# Patient Record
Sex: Male | Born: 1955 | Race: White | Hispanic: No | Marital: Married | Smoking: Never smoker
Health system: Southern US, Community
[De-identification: ages and names within clinical notes are randomized; demographics above are authoritative.]

## PROBLEM LIST (undated history)

## (undated) DIAGNOSIS — E119 Type 2 diabetes mellitus without complications: Secondary | ICD-10-CM

## (undated) DIAGNOSIS — I4891 Unspecified atrial fibrillation: Secondary | ICD-10-CM

## (undated) DIAGNOSIS — I10 Essential (primary) hypertension: Secondary | ICD-10-CM

## (undated) DIAGNOSIS — I7300 Raynaud's syndrome without gangrene: Secondary | ICD-10-CM

## (undated) DIAGNOSIS — I2510 Atherosclerotic heart disease of native coronary artery without angina pectoris: Secondary | ICD-10-CM

## (undated) DIAGNOSIS — E785 Hyperlipidemia, unspecified: Secondary | ICD-10-CM

## (undated) DIAGNOSIS — E079 Disorder of thyroid, unspecified: Secondary | ICD-10-CM

## (undated) HISTORY — DX: Unspecified atrial fibrillation: I48.91

## (undated) HISTORY — PX: CARDIAC CATHETERIZATION: SHX172

## (undated) HISTORY — DX: Essential (primary) hypertension: I10

## (undated) HISTORY — DX: Raynaud's syndrome without gangrene: I73.00

## (undated) HISTORY — DX: Type 2 diabetes mellitus without complications: E11.9

## (undated) HISTORY — DX: Hyperlipidemia, unspecified: E78.5

## (undated) HISTORY — DX: Atherosclerotic heart disease of native coronary artery without angina pectoris: I25.10

## (undated) HISTORY — DX: Disorder of thyroid, unspecified: E07.9

---

## 2005-05-30 HISTORY — PX: CARDIAC SURGERY: SHX584

## 2006-01-30 ENCOUNTER — Ambulatory Visit: Payer: Self-pay | Admitting: Gastroenterology

## 2006-05-31 DIAGNOSIS — I219 Acute myocardial infarction, unspecified: Secondary | ICD-10-CM | POA: Insufficient documentation

## 2008-04-14 ENCOUNTER — Emergency Department: Payer: Self-pay | Admitting: Unknown Physician Specialty

## 2012-08-20 ENCOUNTER — Ambulatory Visit: Payer: Self-pay | Admitting: Family Medicine

## 2012-08-28 ENCOUNTER — Ambulatory Visit: Payer: Self-pay | Admitting: Family Medicine

## 2012-09-16 ENCOUNTER — Ambulatory Visit: Payer: Self-pay | Admitting: Family Medicine

## 2012-09-28 ENCOUNTER — Ambulatory Visit: Payer: Self-pay | Admitting: Family Medicine

## 2013-11-19 IMAGING — US ABDOMEN ULTRASOUND LIMITED
1 series · 14 of 25 positions shown · non-contrast
Comparison: 

REASON FOR EXAM: RUQ pain    Gallbladder
COMMENTS:

PROCEDURE:     US  - US ABDOMEN LIMITED SURVEY  - September 16, 2012  [DATE]
RESULT:     Right or quadrant abdominal ultrasound dated
TECHNIQUE: Real time sonographic imaging of the right quadrant was obtained.
Static representative images were provided for interpretation.

[Series 1: abdomen ultrasound limited · 0.23mm/px · 14 of 51 slices shown]
[im 1/51]
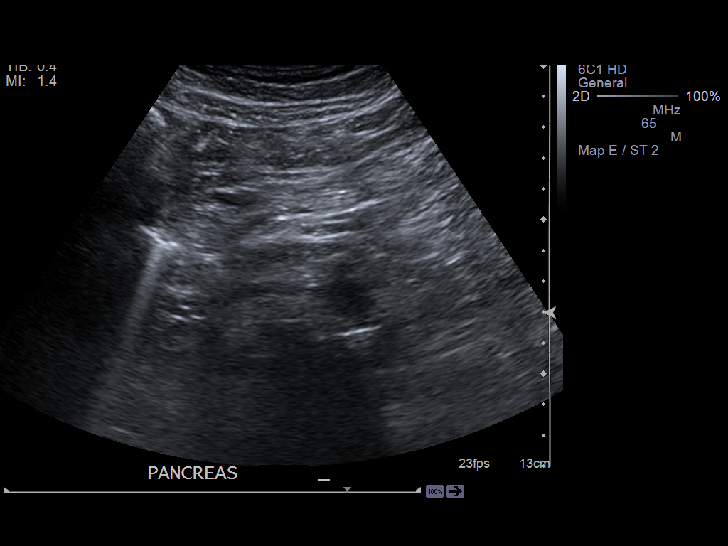
[im 5/51]
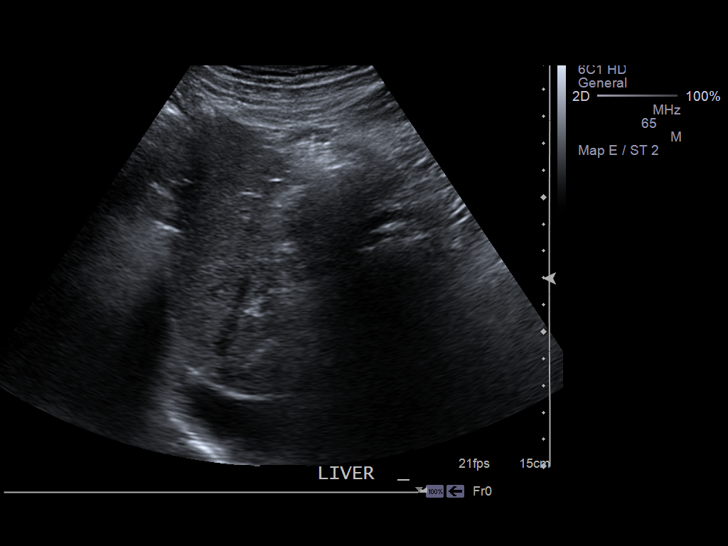
[im 9/51]
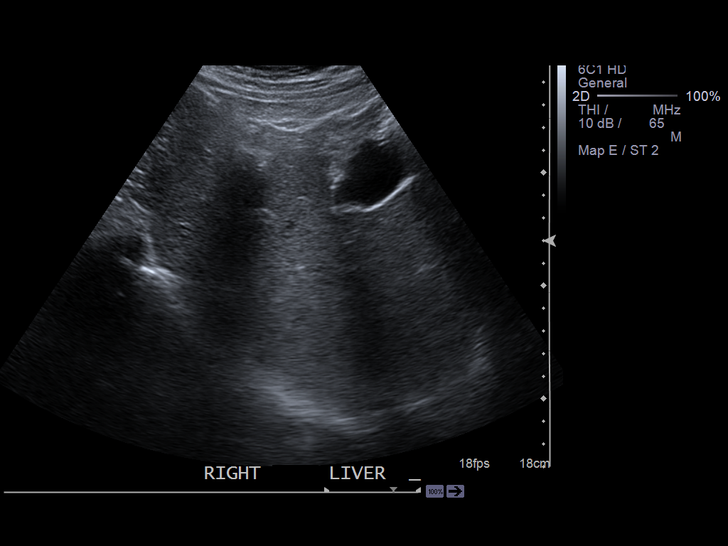
[im 13/51]
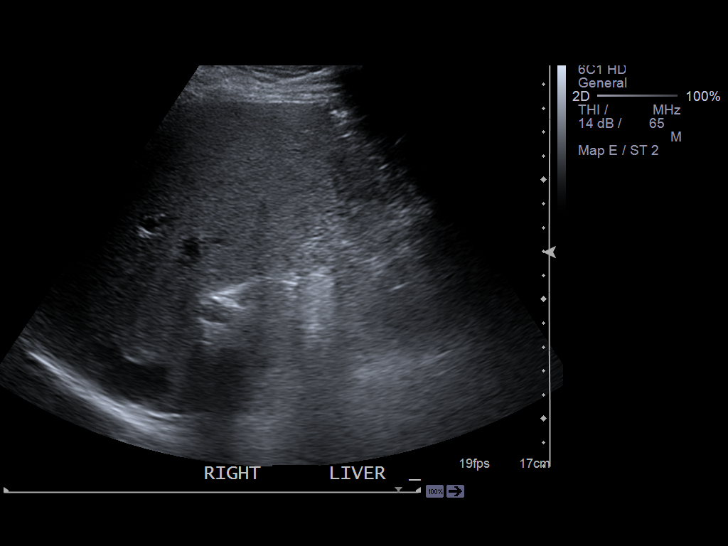
[im 17/51]
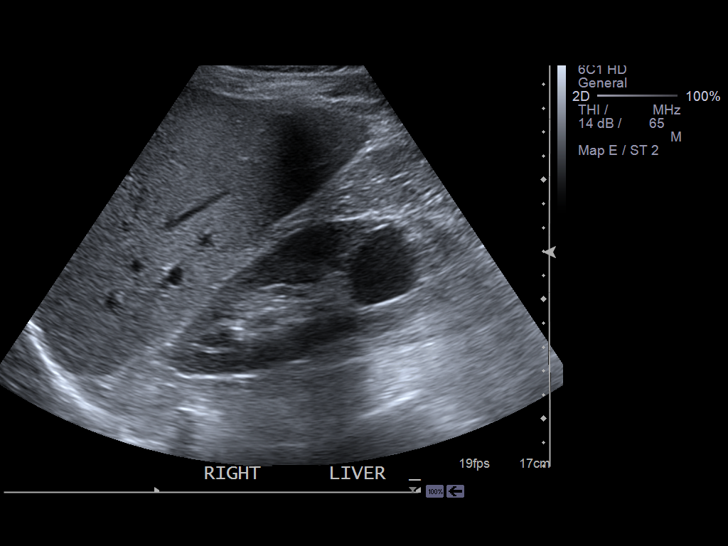
[im 19/51]
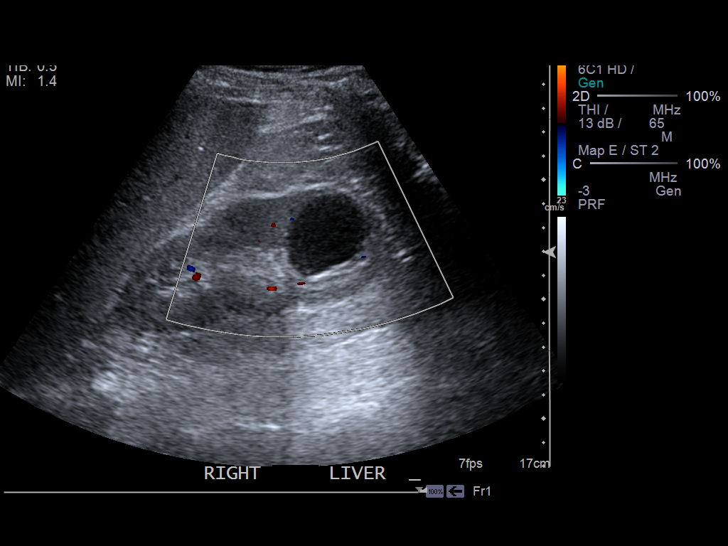
[im 23/51]
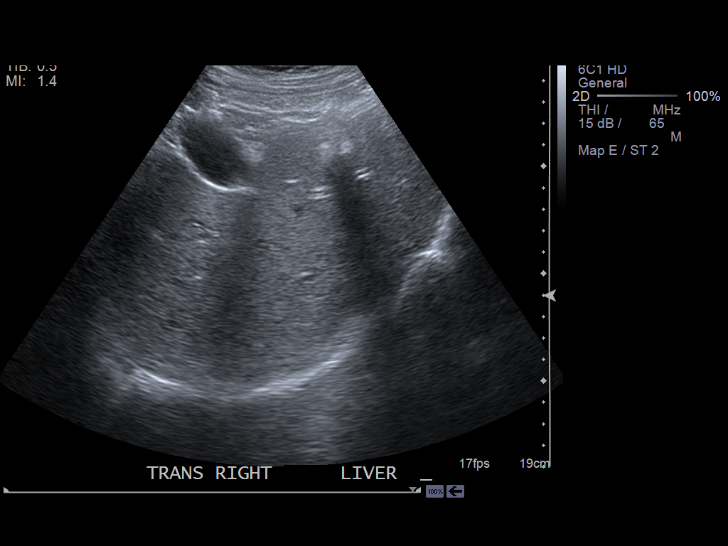
[im 28/51]
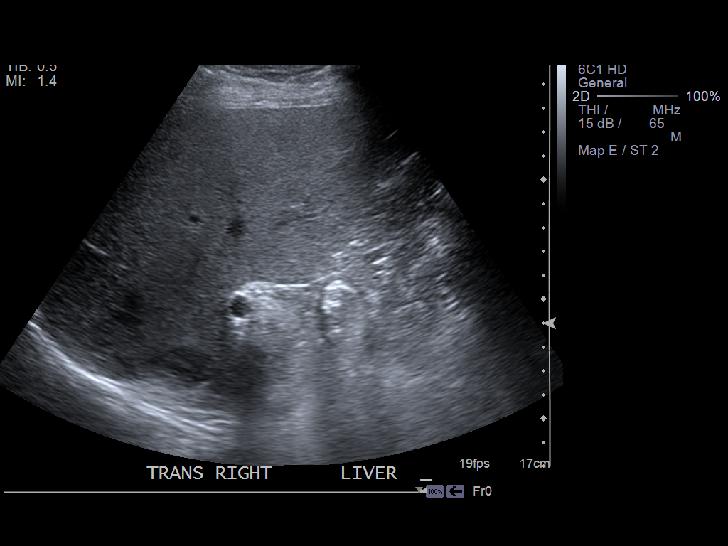
[im 32/51]
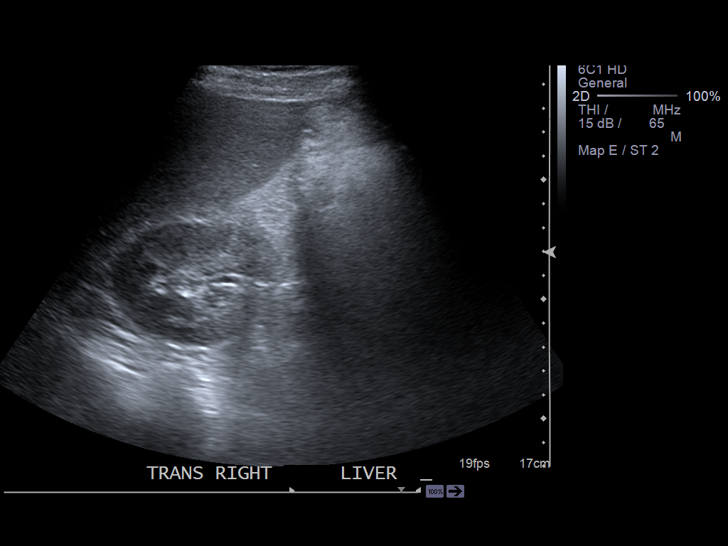
[im 34/51]
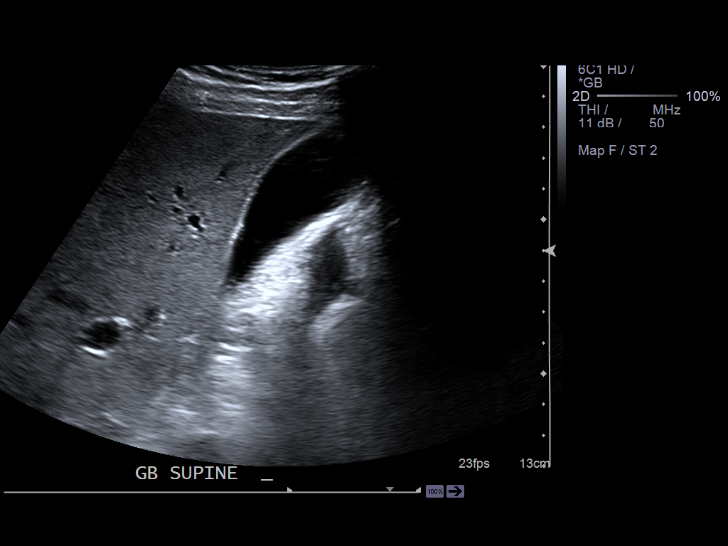
[im 38/51]
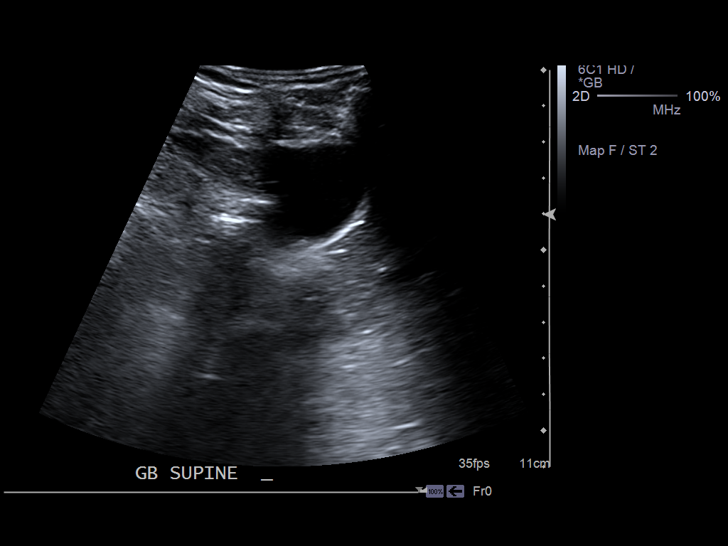
[im 42/51]
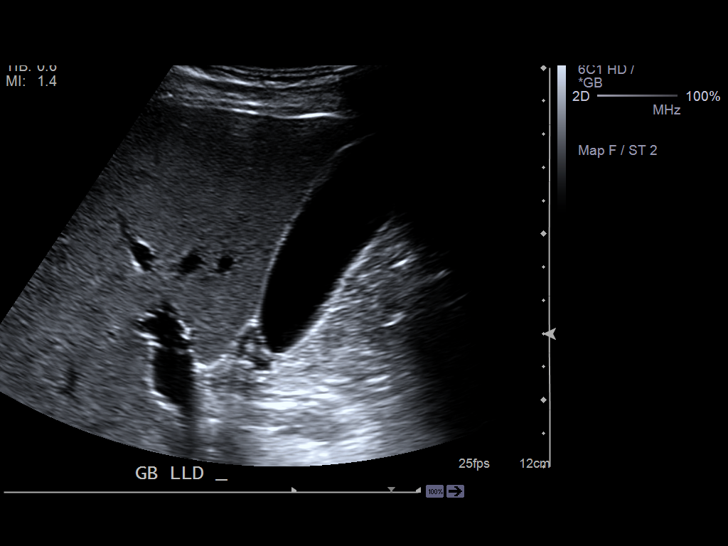
[im 46/51]
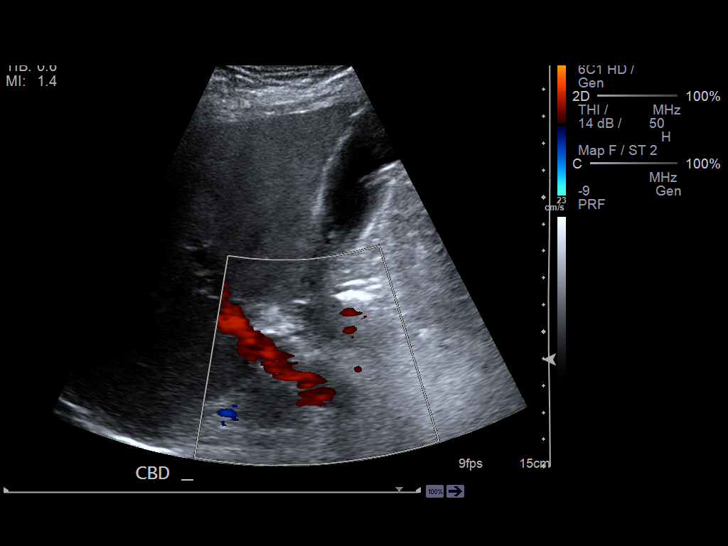
[im 51/51]
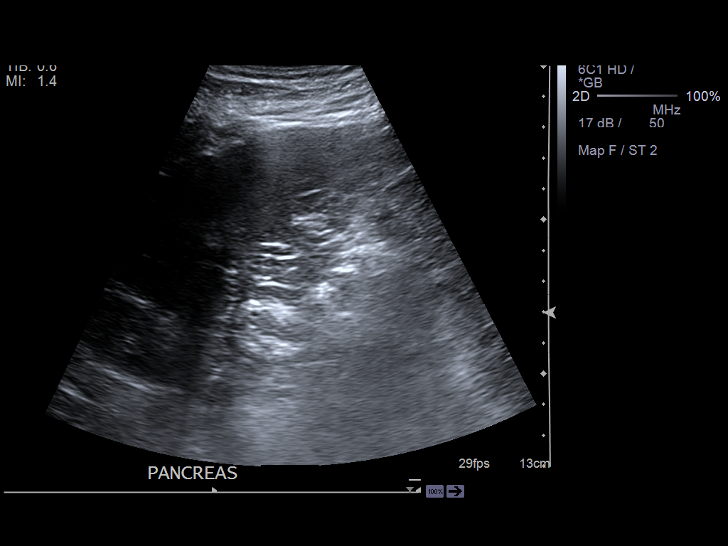

[14 of 25 positions shown; findings below may reference images not displayed]

FINDINGS: The liver is unremarkable. Hepatopedal flow is identified within
the portal vein.

There is no evidence of pericholecystic fluid, gallstones, sludging,
gallbladder wall thickening, nor common bile duct dilation. The gallbladder
wall thickness is 1.6 mm and the common bile duct measures  4.5 mm in
diameter. The technologist was unable to elicit a sonographic Murphy's sign.
The pancreas is unremarkable.

Incidental finding  made of a cyst within the lower pole the right kidney.
IMPRESSION: No sonographic evidence of cholecystitis nor cholelithiasis.

## 2013-11-29 ENCOUNTER — Ambulatory Visit: Payer: Self-pay | Admitting: Gastroenterology

## 2013-12-27 LAB — CBC AND DIFFERENTIAL
HCT: 45 % (ref 41–53)
HEMOGLOBIN: 15.4 g/dL (ref 13.5–17.5)
Neutrophils Absolute: 3 /uL
PLATELETS: 233 10*3/uL (ref 150–399)
WBC: 6.2 10^3/mL

## 2014-06-16 LAB — LIPID PANEL
Cholesterol: 106 mg/dL (ref 0–200)
HDL: 44 mg/dL (ref 35–70)
LDL CALC: 34 mg/dL
LDL/HDL RATIO: 0.8
TRIGLYCERIDES: 141 mg/dL (ref 40–160)

## 2014-06-16 LAB — HEPATIC FUNCTION PANEL
ALT: 38 U/L (ref 10–40)
AST: 31 U/L (ref 14–40)
Alkaline Phosphatase: 82 U/L (ref 25–125)
BILIRUBIN, TOTAL: 0.7 mg/dL

## 2014-06-16 LAB — BASIC METABOLIC PANEL
BUN: 16 mg/dL (ref 4–21)
CREATININE: 0.9 mg/dL (ref 0.6–1.3)
Glucose: 115 mg/dL
Potassium: 5.4 mmol/L — AB (ref 3.4–5.3)
SODIUM: 138 mmol/L (ref 137–147)

## 2014-06-16 LAB — HEMOGLOBIN A1C: HEMOGLOBIN A1C: 5.8 % (ref 4.0–6.0)

## 2014-06-16 LAB — TSH: TSH: 3.35 u[IU]/mL (ref <=5.90)

## 2014-11-10 ENCOUNTER — Encounter: Payer: Self-pay | Admitting: Family Medicine

## 2014-11-15 ENCOUNTER — Other Ambulatory Visit: Payer: Self-pay | Admitting: Family Medicine

## 2014-12-07 DIAGNOSIS — E119 Type 2 diabetes mellitus without complications: Secondary | ICD-10-CM | POA: Insufficient documentation

## 2014-12-07 DIAGNOSIS — E559 Vitamin D deficiency, unspecified: Secondary | ICD-10-CM | POA: Insufficient documentation

## 2014-12-07 DIAGNOSIS — E663 Overweight: Secondary | ICD-10-CM | POA: Insufficient documentation

## 2014-12-07 DIAGNOSIS — I25118 Atherosclerotic heart disease of native coronary artery with other forms of angina pectoris: Secondary | ICD-10-CM | POA: Insufficient documentation

## 2014-12-07 DIAGNOSIS — I7300 Raynaud's syndrome without gangrene: Secondary | ICD-10-CM | POA: Insufficient documentation

## 2014-12-07 DIAGNOSIS — I10 Essential (primary) hypertension: Secondary | ICD-10-CM | POA: Insufficient documentation

## 2014-12-07 DIAGNOSIS — E039 Hypothyroidism, unspecified: Secondary | ICD-10-CM | POA: Insufficient documentation

## 2014-12-07 DIAGNOSIS — E785 Hyperlipidemia, unspecified: Secondary | ICD-10-CM | POA: Insufficient documentation

## 2014-12-07 DIAGNOSIS — F320 Major depressive disorder, single episode, mild: Secondary | ICD-10-CM | POA: Insufficient documentation

## 2014-12-07 DIAGNOSIS — I2510 Atherosclerotic heart disease of native coronary artery without angina pectoris: Secondary | ICD-10-CM | POA: Insufficient documentation

## 2014-12-07 DIAGNOSIS — G4700 Insomnia, unspecified: Secondary | ICD-10-CM | POA: Insufficient documentation

## 2014-12-07 DIAGNOSIS — E1165 Type 2 diabetes mellitus with hyperglycemia: Secondary | ICD-10-CM | POA: Insufficient documentation

## 2014-12-13 ENCOUNTER — Encounter: Payer: Self-pay | Admitting: Family Medicine

## 2014-12-13 ENCOUNTER — Ambulatory Visit (INDEPENDENT_AMBULATORY_CARE_PROVIDER_SITE_OTHER): Payer: 59 | Admitting: Family Medicine

## 2014-12-13 VITALS — BP 116/72 | HR 82 | Temp 97.7°F | Resp 16 | Wt 196.0 lb

## 2014-12-13 DIAGNOSIS — E785 Hyperlipidemia, unspecified: Secondary | ICD-10-CM | POA: Diagnosis not present

## 2014-12-13 DIAGNOSIS — E039 Hypothyroidism, unspecified: Secondary | ICD-10-CM | POA: Diagnosis not present

## 2014-12-13 DIAGNOSIS — J069 Acute upper respiratory infection, unspecified: Secondary | ICD-10-CM

## 2014-12-13 DIAGNOSIS — G4700 Insomnia, unspecified: Secondary | ICD-10-CM | POA: Diagnosis not present

## 2014-12-13 DIAGNOSIS — I10 Essential (primary) hypertension: Secondary | ICD-10-CM

## 2014-12-13 DIAGNOSIS — F320 Major depressive disorder, single episode, mild: Secondary | ICD-10-CM

## 2014-12-13 DIAGNOSIS — E119 Type 2 diabetes mellitus without complications: Secondary | ICD-10-CM | POA: Diagnosis not present

## 2014-12-13 LAB — POCT GLYCOSYLATED HEMOGLOBIN (HGB A1C): Hemoglobin A1C: 6.5

## 2014-12-13 MED ORDER — TEMAZEPAM 30 MG PO CAPS: 30.0000 mg | ORAL_CAPSULE | Freq: Every day | ORAL | Status: DC

## 2014-12-13 NOTE — Progress Notes (Signed)
Patient ID: [name] [name], male   DOB: [birth year], 59 y.o.   MRN: [id number]    Subjective:  HPI  Diabetes Mellitus Type II, Follow-up:   Lab Results  Component Value Date   HGBA1C 5.8 06/16/2014   Last seen for diabetes 6 months ago.  Management since then includes none. He reports good compliance with treatment. He is not having side effects.  Current symptoms include none. Home blood sugar records: 120-140 fasting   Episodes of hypoglycemia? no   Current Insulin Regimen: n/a Most Recent Eye Exam: 10/2014 Current exercise: daily, walking, running sometimes and yard work  ------------------------------------------------------------------------   Hypertension, follow-up:  BP Readings from Last 3 Encounters:  12/13/14 116/72  06/16/14 122/68    He was last seen for hypertension 6 months ago.  BP at that visit was 122/68. Management since that visit includes none .He reports good compliance with treatment. He is not having side effects.  He is exercising. Outside blood pressures are 120-130's/65-75's.  Patient denies chest pain, chest pressure/discomfort, exertional chest pressure/discomfort, irregular heart beat, lower extremity edema and palpitations.   Cardiovascular risk factors include advanced age (older than 59 for men, 13 for women), diabetes mellitus, dyslipidemia, hypertension and male gender.  ------------------------------------------------------------------------    Lipid/Cholesterol, Follow-up:   Last seen for this 6 months ago.  Management since that visit includes none.  Last Lipid Panel:    Component Value Date/Time   CHOL 106 06/16/2014   TRIG 141 06/16/2014   HDL 44 06/16/2014   LDLCALC 34 06/16/2014    He reports good compliance with treatment. He is not having side effects.   Wt Readings from Last 3 Encounters:  12/13/14 196 lb (88.905 kg)  06/16/14 193 lb (87.544 kg)     ------------------------------------------------------------------------  Pt reports that he has URI. His grandson started kindergarten this year and gave him a cold. He reports that it started 4 days ago. Runny, congestion, cough, malaise, some body aches but he had been on the roof the day prior and thinks it is just sore from that. He has a fall festival this weekend and he has to cook and wants to be well for it.    Prior to Admission medications   Medication Sig Start Date End Date Taking? Authorizing Provider  aspirin 81 MG tablet Take by mouth. 12/03/11  Yes Historical Provider, MD  atorvastatin (LIPITOR) 20 MG tablet Take by mouth. 05/24/13  Yes Historical Provider, MD  cholecalciferol (VITAMIN D) 1000 UNITS tablet Take by mouth. 12/03/11  Yes Historical Provider, MD  Coenzyme Q10 100 MG TABS Take by mouth. 12/03/11  Yes Historical Provider, MD  enalapril (VASOTEC) 20 MG tablet Take 1 tablet by mouth  daily 11/15/14  Yes Richard Maceo Pro., MD  ezetimibe (ZETIA) 10 MG tablet Take by mouth. 06/16/14  Yes Historical Provider, MD  fluticasone (FLONASE) 50 MCG/ACT nasal spray Place into the nose. 07/17/12  Yes Historical Provider, MD  lansoprazole (PREVACID 24HR) 15 MG capsule Take by mouth. 06/11/12  Yes Historical Provider, MD  levothyroxine (SYNTHROID) 50 MCG tablet Take by mouth. 06/16/14  Yes Historical Provider, MD  Mesalamine (ASACOL HD) 800 MG TBEC Take by mouth.   Yes Historical Provider, MD  metFORMIN (GLUCOPHAGE) 1000 MG tablet Take by mouth. 06/16/14  Yes Historical Provider, MD  metoprolol succinate (TOPROL-XL) 100 MG 24 hr tablet Take by mouth. 06/16/14  Yes Historical Provider, MD  nitroGLYCERIN (NITROSTAT) 0.4 MG SL tablet Place under the tongue. 06/16/14  Yes Historical  Provider, MD  ondansetron (ZOFRAN ODT) 8 MG disintegrating tablet Take by mouth. 05/20/11  Yes Historical Provider, MD  Saw Palmetto 160 MG TABS Take by mouth. 12/03/11  Yes Historical Provider, MD  sertraline  (ZOLOFT) 100 MG tablet Take by mouth. 05/24/13  Yes Historical Provider, MD  temazepam (RESTORIL) 30 MG capsule Take by mouth. 06/16/14  Yes Historical Provider, MD    Patient Active Problem List   Diagnosis Date Noted  . CAD in native artery 12/07/2014  . Well controlled diabetes mellitus ([place]) 12/07/2014  . Major depressive disorder, single episode, mild ([place]) 12/07/2014  . Essential (primary) hypertension 12/07/2014  . HLD (hyperlipidemia) 12/07/2014  . Adult hypothyroidism 12/07/2014  . Cannot sleep 12/07/2014  . Overweight 12/07/2014  . Raynaud's syndrome 12/07/2014  . Avitaminosis D 12/07/2014  . Acute myocardial infarction ([place]) 05/31/2006    History reviewed. No pertinent past medical history.  Social History   Social History  . Marital Status: Married    Spouse Name: N/A  . Number of Children: N/A  . Years of Education: N/A   Occupational History  . Not on file.   Social History Main Topics  . Smoking status: Never Smoker   . Smokeless tobacco: Not on file  . Alcohol Use: Yes     Comment: 2-3 beer on the weekends  . Drug Use: No  . Sexual Activity: Not on file   Other Topics Concern  . Not on file   Social History Narrative    No Known Allergies  Review of Systems  Constitutional: Positive for malaise/fatigue.  HENT: Positive for congestion and sore throat.   Eyes: Negative.   Respiratory: Positive for cough and sputum production.   Cardiovascular: Negative.   Gastrointestinal: Negative.   Genitourinary: Negative.   Musculoskeletal: Negative.   Skin: Negative.   Neurological: Positive for headaches.  Endo/Heme/Allergies: Negative.   Psychiatric/Behavioral: Negative.     Immunization History  Administered Date(s) Administered  . Pneumococcal Conjugate-13 01/05/2013  . Tdap 02/09/2008   Objective:  BP 116/72 mmHg  Pulse 82  Temp(Src) 97.7 F (36.5 C) (Oral)  Resp 16  Wt 196 lb (88.905 kg)  Physical Exam  Constitutional: He is oriented  to person, place, and time and well-developed, well-nourished, and in no distress.  HENT:  Head: Normocephalic and atraumatic.  Right Ear: External ear normal.  Left Ear: External ear normal.  Nose: Nose normal.  Mouth/Throat: Oropharynx is clear and moist.  Sinus fullness  Eyes: Conjunctivae and EOM are normal. Pupils are equal, round, and reactive to light.  Neck: Normal range of motion. Neck supple.  Cardiovascular: Normal rate, regular rhythm, normal heart sounds and intact distal pulses.   Pulmonary/Chest: Effort normal and breath sounds normal.  Abdominal: Soft. Bowel sounds are normal.  Musculoskeletal: Normal range of motion.  Neurological: He is alert and oriented to person, place, and time. He has normal reflexes. Gait normal. GCS score is 15.  Skin: Skin is warm and dry.  Psychiatric: Mood, memory, affect and judgment normal.    Lab Results  Component Value Date   WBC 6.2 12/27/2013   HGB 15.4 12/27/2013   HCT 45 12/27/2013   PLT 233 12/27/2013   CHOL 106 06/16/2014   TRIG 141 06/16/2014   HDL 44 06/16/2014   LDLCALC 34 06/16/2014   TSH 3.35 06/16/2014   HGBA1C 5.8 06/16/2014    CMP     Component Value Date/Time   NA 138 06/16/2014   K 5.4* 06/16/2014  BUN 16 06/16/2014   CREATININE 0.9 06/16/2014   AST 31 06/16/2014   ALT 38 06/16/2014   ALKPHOS 82 06/16/2014    Assessment and Plan :   1. Essential (primary) hypertension Stable  2. Well controlled diabetes mellitus ([place])  - POCT HgB A1C- 6.3 today. No changes now, Will follow.  3. Cannot sleep Refilled Temazepam  4. HLD (hyperlipidemia)   5. Major depressive disorder, single episode, mild ([place])  6.URI- treat symptoms, pt will call if not getting better. 7. CAD All risk factors treated   I have done the exam and reviewed the above chart and it is accurate to the best of my knowledge.    Patient was seen and examined by Dr. Miguel Aschoff, and noted scribed by Webb Laws,  [place] MD [facility] 12/13/2014 8:16 AM

## 2014-12-14 LAB — LIPID PANEL WITH LDL/HDL RATIO
CHOLESTEROL TOTAL: 123 mg/dL (ref 100–199)
HDL: 40 mg/dL (ref >39)
LDL Calculated: 31 mg/dL (ref 0–99)
LDl/HDL Ratio: 0.8 ratio units (ref 0.0–3.6)
Triglycerides: 258 mg/dL — ABNORMAL HIGH (ref 0–149)
VLDL Cholesterol Cal: 52 mg/dL — ABNORMAL HIGH (ref 5–40)

## 2014-12-14 LAB — COMPREHENSIVE METABOLIC PANEL
ALBUMIN: 4.6 g/dL (ref 3.5–5.5)
ALT: 46 IU/L — AB (ref 0–44)
AST: 30 IU/L (ref 0–40)
Albumin/Globulin Ratio: 1.8 (ref 1.1–2.5)
Alkaline Phosphatase: 84 IU/L (ref 39–117)
BILIRUBIN TOTAL: 0.4 mg/dL (ref 0.0–1.2)
BUN / CREAT RATIO: 19 (ref 9–20)
BUN: 19 mg/dL (ref 6–24)
CALCIUM: 9.6 mg/dL (ref 8.7–10.2)
CHLORIDE: 101 mmol/L (ref 97–106)
CO2: 21 mmol/L (ref 18–29)
CREATININE: 1 mg/dL (ref 0.76–1.27)
GFR calc Af Amer: 95 mL/min/{1.73_m2} (ref >59)
GFR calc non Af Amer: 83 mL/min/{1.73_m2} (ref >59)
GLUCOSE: 130 mg/dL — AB (ref 65–99)
Globulin, Total: 2.5 g/dL (ref 1.5–4.5)
Potassium: 4.7 mmol/L (ref 3.5–5.2)
Sodium: 139 mmol/L (ref 136–144)
TOTAL PROTEIN: 7.1 g/dL (ref 6.0–8.5)

## 2014-12-14 LAB — TSH: TSH: 6.01 u[IU]/mL — AB (ref 0.450–4.500)

## 2014-12-21 ENCOUNTER — Other Ambulatory Visit: Payer: Self-pay

## 2014-12-21 MED ORDER — LEVOTHYROXINE SODIUM 75 MCG PO TABS: 75.0000 ug | ORAL_TABLET | Freq: Every day | ORAL | Status: DC

## 2014-12-21 NOTE — Progress Notes (Signed)
Advised and Rx sent in

## 2015-04-25 ENCOUNTER — Ambulatory Visit (INDEPENDENT_AMBULATORY_CARE_PROVIDER_SITE_OTHER): Payer: 59 | Admitting: Family Medicine

## 2015-04-25 ENCOUNTER — Encounter: Payer: Self-pay | Admitting: Family Medicine

## 2015-04-25 VITALS — BP 104/78 | HR 72 | Temp 97.6°F | Resp 14 | Ht 69.5 in | Wt 195.0 lb

## 2015-04-25 DIAGNOSIS — Z125 Encounter for screening for malignant neoplasm of prostate: Secondary | ICD-10-CM

## 2015-04-25 DIAGNOSIS — Z1211 Encounter for screening for malignant neoplasm of colon: Secondary | ICD-10-CM

## 2015-04-25 DIAGNOSIS — Z9289 Personal history of other medical treatment: Secondary | ICD-10-CM | POA: Diagnosis not present

## 2015-04-25 DIAGNOSIS — E119 Type 2 diabetes mellitus without complications: Secondary | ICD-10-CM

## 2015-04-25 DIAGNOSIS — Z0000 Encounter for general adult medical examination without abnormal findings: Secondary | ICD-10-CM | POA: Diagnosis not present

## 2015-04-25 DIAGNOSIS — F320 Major depressive disorder, single episode, mild: Secondary | ICD-10-CM

## 2015-04-25 DIAGNOSIS — M2550 Pain in unspecified joint: Secondary | ICD-10-CM | POA: Diagnosis not present

## 2015-04-25 DIAGNOSIS — E039 Hypothyroidism, unspecified: Secondary | ICD-10-CM

## 2015-04-25 DIAGNOSIS — I10 Essential (primary) hypertension: Secondary | ICD-10-CM

## 2015-04-25 LAB — POCT URINALYSIS DIPSTICK
Bilirubin, UA: NEGATIVE
Blood, UA: NEGATIVE
GLUCOSE UA: NEGATIVE
Ketones, UA: NEGATIVE
LEUKOCYTES UA: NEGATIVE
NITRITE UA: NEGATIVE
PROTEIN UA: NEGATIVE
SPEC GRAV UA: 1.02
UROBILINOGEN UA: 0.2
pH, UA: 6

## 2015-04-25 LAB — IFOBT (OCCULT BLOOD): IFOBT: NEGATIVE

## 2015-04-25 LAB — POCT GLYCOSYLATED HEMOGLOBIN (HGB A1C): Hemoglobin A1C: 6.4

## 2015-04-25 NOTE — Progress Notes (Signed)
Patient ID: [name] [name], male   DOB: November 08, 1955, 60 y.o.   MRN: [id number]  Visit Date: 04/25/2015  Today's Provider: [name], MD   Chief Complaint  Patient presents with  . Annual Exam   Subjective:  [name] [name] is a 60 y.o. male who presents today for health maintenance and complete physical. He feels fairly well. He reports exercising daily-walking, weight lifting and running and working around the house. He reports he is sleeping well. Immunization History  Administered Date(s) Administered  . Pneumococcal Conjugate-13 01/05/2013  . Tdap 02/09/2008   Colonoscopy 11/29/13 internal hemorrhoids, diverticulosis.  Review of Systems  Constitutional: Positive for appetite change.  HENT: Positive for congestion, facial swelling and sneezing.   Eyes: Positive for discharge, redness and itching.  Respiratory: Negative.   Cardiovascular: Negative.   Gastrointestinal: Negative.   Endocrine: Positive for polyphagia.  Genitourinary: Negative.   Musculoskeletal: Positive for joint swelling and arthralgias.  Skin: Negative.   Allergic/Immunologic: Negative.   Neurological: Negative.   Hematological: Negative.   Psychiatric/Behavioral: Negative.     Social History   Social History  . Marital Status: Married    Spouse Name: N/A  . Number of Children: N/A  . Years of Education: N/A   Occupational History  . Not on file.   Social History Main Topics  . Smoking status: Never Smoker   . Smokeless tobacco: Never Used  . Alcohol Use: Yes     Comment: 2-3 beer on the weekends  . Drug Use: No  . Sexual Activity: Yes   Other Topics Concern  . Not on file   Social History Narrative    Patient Active Problem List   Diagnosis Date Noted  . CAD in native artery 12/07/2014  . Well controlled diabetes mellitus ([place]) 12/07/2014  . Major depressive disorder, single episode, mild ([place]) 12/07/2014  . Essential (primary) hypertension 12/07/2014  . HLD  (hyperlipidemia) 12/07/2014  . Adult hypothyroidism 12/07/2014  . Cannot sleep 12/07/2014  . Overweight 12/07/2014  . Raynaud's syndrome 12/07/2014  . Avitaminosis D 12/07/2014  . Acute myocardial infarction [facility]) 05/31/2006    Past Surgical History  Procedure Laterality Date  . Cardiac surgery  05/30/05    stent placement    His family history includes Diabetes in his mother; Glaucoma in his mother; Gout in his mother; Heart disease in his father and mother; Hypertension in his father, mother, sister, and sister; Prostate cancer in his father; Skin cancer in his mother; Ulcers in his mother.    Outpatient Prescriptions Prior to Visit  Medication Sig Dispense Refill  . aspirin 81 MG tablet Take by mouth.    Marland Kitchen atorvastatin (LIPITOR) 20 MG tablet Take by mouth.    . cholecalciferol (VITAMIN D) 1000 UNITS tablet Take by mouth.    . Coenzyme Q10 100 MG TABS Take by mouth.    . enalapril (VASOTEC) 20 MG tablet Take 1 tablet by mouth  daily 90 tablet 3  . ezetimibe (ZETIA) 10 MG tablet Take by mouth.    . fluticasone (FLONASE) 50 MCG/ACT nasal spray Place into the nose.    . lansoprazole (PREVACID 24HR) 15 MG capsule Take by mouth.    . levothyroxine (SYNTHROID, LEVOTHROID) 75 MCG tablet Take 1 tablet (75 mcg total) by mouth daily before breakfast. 90 tablet 3  . metFORMIN (GLUCOPHAGE) 1000 MG tablet Take 1,000 mg by mouth 2 (two) times daily with a meal.     . metoprolol succinate (TOPROL-XL) 100 MG 24  hr tablet Take 100 mg by mouth daily.     . Saw Palmetto 160 MG TABS Take by mouth.    . sertraline (ZOLOFT) 100 MG tablet Take 100 mg by mouth daily.     . temazepam (RESTORIL) 30 MG capsule Take 1 capsule (30 mg total) by mouth at bedtime. 30 capsule 5  . nitroGLYCERIN (NITROSTAT) 0.4 MG SL tablet Place under the tongue. Reported on 04/25/2015    . ondansetron (ZOFRAN ODT) 8 MG disintegrating tablet Take by mouth. Reported on 04/25/2015    . Mesalamine (ASACOL HD) 800 MG TBEC Take by mouth.      No facility-administered medications prior to visit.    Patient Care Team: Jerrol Banana., MD as PCP - General (Family Medicine)     Objective:   Vitals:  Filed Vitals:   04/25/15 0851  BP: 104/78  Pulse: 72  Temp: 97.6 F (36.4 C)  Resp: 14  Height: 5' 9.5" (1.765 m)  Weight: 195 lb (88.451 kg)    Physical Exam  Constitutional: He is oriented to person, place, and time. He appears well-developed and well-nourished.  HENT:  Head: Normocephalic and atraumatic.  Right Ear: External ear normal.  Left Ear: External ear normal.  Mouth/Throat: Oropharynx is clear and moist.  Eyes: Conjunctivae are normal. Pupils are equal, round, and reactive to light.  Neck: Normal range of motion. Neck supple.  Cardiovascular: Normal rate, regular rhythm, normal heart sounds and intact distal pulses.   No murmur heard. Pulmonary/Chest: Effort normal and breath sounds normal. No respiratory distress. He has no wheezes.  Abdominal: Soft. Bowel sounds are normal. He exhibits no distension. There is no tenderness.  Genitourinary: Rectum normal, prostate normal and penis normal. Guaiac negative stool. No penile tenderness.  Musculoskeletal: He exhibits no edema or tenderness.  Neurological: He is alert and oriented to person, place, and time. He has normal reflexes. No cranial nerve deficit. Coordination normal.  Skin: Skin is warm and dry. No rash noted. No erythema.  Psychiatric: He has a normal mood and affect. His behavior is normal. Judgment and thought content normal.     Depression Screen PHQ 2/9 Scores 04/25/2015 12/13/2014  PHQ - 2 Score 0 0      Assessment & Plan:   1. Annual physical exam - CBC with Differential/Platelet - Comprehensive metabolic panel - TSH - POCT Urinalysis Dipstick  2. Colon cancer screening - IFOBT POC (occult bld, rslt in office)  3. Prostate cancer screening - PSA  4. Well controlled diabetes mellitus ([place]) A1C 6.4 today, better.  Continue current regimen. - POCT HgB A1C  5. Essential (primary) hypertension Stable.  6. Hypothyroidism, unspecified hypothyroidism type Re check levels after dose adjustment.  7. Major depressive disorder, single episode, mild ([facility]) Stable.  8. Arthralgia New. Check labs and refer to Hand surgeon for further treatment. - Sed Rate (ESR) - Rheumatoid Factor - ANA w/Reflex if Positive - Ambulatory referral to Hand Surgery  9. History of blood transfusion As a baby/child. - HIV antibody (with reflex) I have done the exam and reviewed the above chart and it is accurate to the best of my knowledge.  Patient was seen and examined by Dr. Eulas Post and note was scribed by Theressa Millard, RMA.

## 2015-04-26 ENCOUNTER — Telehealth: Payer: Self-pay

## 2015-04-26 LAB — CBC WITH DIFFERENTIAL/PLATELET
BASOS ABS: 0.1 10*3/uL (ref 0.0–0.2)
BASOS: 1 %
EOS (ABSOLUTE): 0.2 10*3/uL (ref 0.0–0.4)
Eos: 3 %
Hematocrit: 42.4 % (ref 37.5–51.0)
Hemoglobin: 14.2 g/dL (ref 12.6–17.7)
IMMATURE GRANS (ABS): 0 10*3/uL (ref 0.0–0.1)
IMMATURE GRANULOCYTES: 0 %
LYMPHS: 28 %
Lymphocytes Absolute: 2.2 10*3/uL (ref 0.7–3.1)
MCH: 30.2 pg (ref 26.6–33.0)
MCHC: 33.5 g/dL (ref 31.5–35.7)
MCV: 90 fL (ref 79–97)
Monocytes Absolute: 0.7 10*3/uL (ref 0.1–0.9)
Monocytes: 10 %
NEUTROS PCT: 58 %
Neutrophils Absolute: 4.5 10*3/uL (ref 1.4–7.0)
PLATELETS: 230 10*3/uL (ref 150–379)
RBC: 4.7 x10E6/uL (ref 4.14–5.80)
RDW: 13.6 % (ref 12.3–15.4)
WBC: 7.7 10*3/uL (ref 3.4–10.8)

## 2015-04-26 LAB — COMPREHENSIVE METABOLIC PANEL
ALT: 45 IU/L — AB (ref 0–44)
AST: 36 IU/L (ref 0–40)
Albumin/Globulin Ratio: 2.3 — ABNORMAL HIGH (ref 1.2–2.2)
Albumin: 4.9 g/dL (ref 3.5–5.5)
Alkaline Phosphatase: 78 IU/L (ref 39–117)
BUN/Creatinine Ratio: 19 (ref 9–20)
BUN: 18 mg/dL (ref 6–24)
Bilirubin Total: 0.6 mg/dL (ref 0.0–1.2)
CALCIUM: 10.1 mg/dL (ref 8.7–10.2)
CO2: 22 mmol/L (ref 18–29)
CREATININE: 0.95 mg/dL (ref 0.76–1.27)
Chloride: 98 mmol/L (ref 96–106)
GFR, EST AFRICAN AMERICAN: 101 mL/min/{1.73_m2} (ref >59)
GFR, EST NON AFRICAN AMERICAN: 87 mL/min/{1.73_m2} (ref >59)
GLUCOSE: 132 mg/dL — AB (ref 65–99)
Globulin, Total: 2.1 g/dL (ref 1.5–4.5)
Potassium: 5.5 mmol/L — ABNORMAL HIGH (ref 3.5–5.2)
Sodium: 138 mmol/L (ref 134–144)
TOTAL PROTEIN: 7 g/dL (ref 6.0–8.5)

## 2015-04-26 LAB — PSA: Prostate Specific Ag, Serum: 1.5 ng/mL (ref 0.0–4.0)

## 2015-04-26 LAB — RHEUMATOID FACTOR

## 2015-04-26 LAB — HIV ANTIBODY (ROUTINE TESTING W REFLEX): HIV Screen 4th Generation wRfx: NONREACTIVE

## 2015-04-26 LAB — ANA W/REFLEX IF POSITIVE: Anti Nuclear Antibody(ANA): NEGATIVE

## 2015-04-26 LAB — TSH: TSH: 4.63 u[IU]/mL — ABNORMAL HIGH (ref 0.450–4.500)

## 2015-04-26 LAB — SEDIMENTATION RATE: SED RATE: 2 mm/h (ref 0–30)

## 2015-04-26 NOTE — Telephone Encounter (Signed)
Pt advised.   Thanks,   -Raffaela Ladley  

## 2015-04-26 NOTE — Telephone Encounter (Signed)
-----   Message from Jerrol Banana., MD sent at 04/26/2015 11:41 AM EDT ----- Labs stable.

## 2015-04-27 NOTE — Progress Notes (Signed)
Advised  ED 

## 2015-04-28 ENCOUNTER — Telehealth: Payer: Self-pay

## 2015-04-28 DIAGNOSIS — G4700 Insomnia, unspecified: Secondary | ICD-10-CM

## 2015-04-28 MED ORDER — EZETIMIBE 10 MG PO TABS: 10.0000 mg | ORAL_TABLET | Freq: Every day | ORAL | Status: DC

## 2015-04-28 MED ORDER — METOPROLOL SUCCINATE ER 100 MG PO TB24: 100.0000 mg | ORAL_TABLET | Freq: Every day | ORAL | Status: DC

## 2015-04-28 MED ORDER — METFORMIN HCL 1000 MG PO TABS: 1000.0000 mg | ORAL_TABLET | Freq: Two times a day (BID) | ORAL | Status: DC

## 2015-04-28 MED ORDER — TEMAZEPAM 30 MG PO CAPS: 30.0000 mg | ORAL_CAPSULE | Freq: Every day | ORAL | Status: DC

## 2015-04-28 NOTE — Telephone Encounter (Signed)
Okay to refill for 1 year. 43-months on  temazepam.

## 2015-04-28 NOTE — Telephone Encounter (Signed)
Done-aa 

## 2015-04-28 NOTE — Telephone Encounter (Signed)
Patient emailed asking refills for Metformin, Temazepam, Ezetimibe, Metoprolol succi, please review-Needs to go to [facility]

## 2015-05-29 ENCOUNTER — Other Ambulatory Visit: Payer: Self-pay | Admitting: Physician Assistant

## 2015-05-29 DIAGNOSIS — F329 Major depressive disorder, single episode, unspecified: Secondary | ICD-10-CM

## 2015-05-29 DIAGNOSIS — F32A Depression, unspecified: Secondary | ICD-10-CM

## 2015-05-29 DIAGNOSIS — G4700 Insomnia, unspecified: Secondary | ICD-10-CM

## 2015-05-29 MED ORDER — SERTRALINE HCL 100 MG PO TABS: 100.0000 mg | ORAL_TABLET | Freq: Every day | ORAL | Status: DC

## 2015-05-29 MED ORDER — TEMAZEPAM 30 MG PO CAPS: 30.0000 mg | ORAL_CAPSULE | Freq: Every day | ORAL | Status: DC

## 2015-07-22 ENCOUNTER — Other Ambulatory Visit: Payer: Self-pay | Admitting: Physician Assistant

## 2015-08-14 ENCOUNTER — Encounter: Payer: Self-pay | Admitting: Family Medicine

## 2015-08-14 ENCOUNTER — Ambulatory Visit (INDEPENDENT_AMBULATORY_CARE_PROVIDER_SITE_OTHER): Payer: 59 | Admitting: Family Medicine

## 2015-08-14 VITALS — BP 112/68 | HR 72 | Temp 97.6°F | Resp 16 | Ht 70.0 in | Wt 192.0 lb

## 2015-08-14 DIAGNOSIS — E785 Hyperlipidemia, unspecified: Secondary | ICD-10-CM | POA: Diagnosis not present

## 2015-08-14 DIAGNOSIS — G4700 Insomnia, unspecified: Secondary | ICD-10-CM | POA: Diagnosis not present

## 2015-08-14 DIAGNOSIS — E119 Type 2 diabetes mellitus without complications: Secondary | ICD-10-CM

## 2015-08-14 DIAGNOSIS — I10 Essential (primary) hypertension: Secondary | ICD-10-CM

## 2015-08-14 LAB — POCT GLYCOSYLATED HEMOGLOBIN (HGB A1C)
Est. average glucose Bld gHb Est-mCnc: 134
Hemoglobin A1C: 6.3

## 2015-08-14 MED ORDER — TEMAZEPAM 30 MG PO CAPS: 30.0000 mg | ORAL_CAPSULE | Freq: Every day | ORAL | Status: DC

## 2015-08-14 NOTE — Progress Notes (Signed)
Patient: [name] [name] Male    DOB: 1955/03/20   60 y.o.   MRN: [id number] Visit Date: 08/14/2015  Today's Provider: [name], MD   Chief Complaint  Patient presents with  . Diabetes  . Hyperlipidemia  . Hypertension   Subjective:    HPI      Diabetes Mellitus Type II, Follow-up:   Lab Results  Component Value Date   HGBA1C 6.3 08/14/2015   HGBA1C 6.4 04/25/2015   HGBA1C 6.5 12/13/2014   Last seen for diabetes 4 months ago.  Management since then includes none. He reports excellent compliance with treatment. He is not having side effects. Current symptoms include increase appetite and have been stable. Home blood sugar records: fasting range: 95-165  Episodes of hypoglycemia? no   Most Recent Eye Exam: August 2016 Weight trend: stable Current diet: well balanced Current exercise: very active with yard work  ------------------------------------------------------------------------   Hypertension, follow-up:  BP Readings from Last 3 Encounters:  08/14/15 112/68  04/25/15 104/78  12/13/14 116/72    He was last seen for hypertension 4 months ago.  BP at that visit was 104/78. Management since that visit includes none .He reports excellent compliance with treatment. He is not having side effects.  He is adherent to low salt diet.   Outside blood pressures are 120/80. He is experiencing none.  Patient denies chest pain, claudication, dyspnea, exertional chest pressure/discomfort, fatigue, irregular heart beat, lower extremity edema, near-syncope, orthopnea, palpitations and syncope.   Cardiovascular risk factors include advanced age (older than 31 for men, 68 for women), diabetes mellitus, dyslipidemia, family history of premature cardiovascular disease, hypertension and male gender.    ------------------------------------------------------------------------    Lipid/Cholesterol, Follow-up:   Last seen for this 4 months ago.    Management since that visit includes none.  Last Lipid Panel:    Component Value Date/Time   CHOL 123 12/13/2014 0856   CHOL 106 06/16/2014   TRIG 258* 12/13/2014 0856   HDL 40 12/13/2014 0856   HDL 44 06/16/2014   LDLCALC 31 12/13/2014 0856   LDLCALC 34 06/16/2014    He reports excellent compliance with treatment. He is not having side effects.   Wt Readings from Last 3 Encounters:  08/14/15 192 lb (87.091 kg)  04/25/15 195 lb (88.451 kg)  12/13/14 196 lb (88.905 kg)    ------------------------------------------------------------------------     No Known Allergies Current Meds  Medication Sig  . aspirin 81 MG tablet Take by mouth.  Marland Kitchen atorvastatin (LIPITOR) 20 MG tablet Take by mouth.  . cholecalciferol (VITAMIN D) 1000 UNITS tablet Take by mouth.  . Coenzyme Q10 100 MG TABS Take by mouth.  . enalapril (VASOTEC) 20 MG tablet Take 1 tablet by mouth  daily  . ezetimibe (ZETIA) 10 MG tablet Take 1 tablet (10 mg total) by mouth daily.  . lansoprazole (PREVACID 24HR) 15 MG capsule Take by mouth.  . levothyroxine (SYNTHROID, LEVOTHROID) 75 MCG tablet Take 1 tablet (75 mcg total) by mouth daily before breakfast.  . metFORMIN (GLUCOPHAGE) 1000 MG tablet Take 1 tablet (1,000 mg total) by mouth 2 (two) times daily with a meal.  . metoprolol succinate (TOPROL-XL) 100 MG 24 hr tablet Take 1 tablet (100 mg total) by mouth daily.  . nitroGLYCERIN (NITROSTAT) 0.4 MG SL tablet Place under the tongue. Reported on 04/25/2015  . ondansetron (ZOFRAN ODT) 8 MG disintegrating tablet Take by mouth. Reported on 04/25/2015  . pyridOXINE (VITAMIN B-6) 100 MG  tablet Take 100 mg by mouth daily.  . Saw Palmetto 160 MG TABS Take by mouth.  . sertraline (ZOLOFT) 100 MG tablet TAKE 1 TABLET (100 MG TOTAL) BY MOUTH DAILY.  Marland Kitchen temazepam (RESTORIL) 30 MG capsule Take 1 capsule (30 mg total) by mouth at bedtime.    Review of Systems  Eyes: Negative.   Respiratory: Negative.   Cardiovascular: Negative.    Endocrine: Negative.   Genitourinary: Negative.   Allergic/Immunologic: Negative.   Neurological: Negative.   Hematological: Negative.     Social History  Substance Use Topics  . Smoking status: Never Smoker   . Smokeless tobacco: Never Used  . Alcohol Use: Yes     Comment: 2-3 beer on the weekends   Objective:   BP 112/68 mmHg  Pulse 72  Temp(Src) 97.6 F (36.4 C) (Oral)  Resp 16  Ht 5\' 10"  (1.778 m)  Wt 192 lb (87.091 kg)  BMI 27.55 kg/m2  Physical Exam  Constitutional: He is oriented to person, place, and time. He appears well-developed and well-nourished.  HENT:  Head: Normocephalic and atraumatic.  Right Ear: External ear normal.  Left Ear: External ear normal.  Nose: Nose normal.  Eyes: Conjunctivae are normal.  Neck: Neck supple.  Cardiovascular: Normal rate, regular rhythm and normal heart sounds.   Pulmonary/Chest: Effort normal and breath sounds normal. No respiratory distress.  Abdominal: Soft. Bowel sounds are normal.  Musculoskeletal: He exhibits no edema.  Neurological: He is alert and oriented to person, place, and time. No cranial nerve deficit. He exhibits normal muscle tone. Coordination normal.  Skin: Skin is warm and dry.  Psychiatric: He has a normal mood and affect. His behavior is normal. Thought content normal.        Assessment & Plan:     1. Essential (primary) hypertension Stable. Continue current medications and plan of care.  2. Well controlled diabetes mellitus ([place]) Stable. Continue current medications and plan of care. - POCT glycosylated hemoglobin (Hb A1C) Results for orders placed or performed in visit on 08/14/15  POCT glycosylated hemoglobin (Hb A1C)  Result Value Ref Range   Hemoglobin A1C 6.3    Est. average glucose Bld gHb Est-mCnc 134      3. HLD (hyperlipidemia) FU pending labs. - Lipid panel  4. Cannot sleep Stable. Refills provided. - temazepam (RESTORIL) 30 MG capsule; Take 1 capsule (30 mg total) by  mouth at bedtime.  Dispense: 90 capsule; Refill: 1  5. CAD All risk factors treated.    Patient seen and examined by Miguel Aschoff, MD, and note scribed by Renaldo Fiddler, CMA.   Izzah Pasqua Cranford Mon, MD  [facility]

## 2015-08-15 ENCOUNTER — Telehealth: Payer: Self-pay

## 2015-08-15 LAB — LIPID PANEL
CHOLESTEROL TOTAL: 111 mg/dL (ref 100–199)
Chol/HDL Ratio: 2.5 ratio units (ref 0.0–5.0)
HDL: 45 mg/dL (ref >39)
LDL CALC: 25 mg/dL (ref 0–99)
TRIGLYCERIDES: 207 mg/dL — AB (ref 0–149)
VLDL Cholesterol Cal: 41 mg/dL — ABNORMAL HIGH (ref 5–40)

## 2015-08-15 NOTE — Telephone Encounter (Signed)
-----   Message from Jerrol Banana., MD sent at 08/15/2015  8:15 AM EDT ----- Lipids good.

## 2015-08-15 NOTE — Telephone Encounter (Signed)
Pt advised.   Thanks,   -Laura  

## 2015-10-04 ENCOUNTER — Other Ambulatory Visit: Payer: Self-pay

## 2015-10-04 DIAGNOSIS — G4700 Insomnia, unspecified: Secondary | ICD-10-CM

## 2015-10-04 MED ORDER — METFORMIN HCL 1000 MG PO TABS: 1000.0000 mg | ORAL_TABLET | Freq: Two times a day (BID) | ORAL | 3 refills | Status: DC

## 2015-10-04 MED ORDER — TEMAZEPAM 30 MG PO CAPS: 30.0000 mg | ORAL_CAPSULE | Freq: Every day | ORAL | 1 refills | Status: DC

## 2015-10-04 MED ORDER — NITROGLYCERIN 0.4 MG SL SUBL: 0.4000 mg | SUBLINGUAL_TABLET | SUBLINGUAL | 3 refills | Status: DC | PRN

## 2015-10-04 MED ORDER — ATORVASTATIN CALCIUM 20 MG PO TABS: 20.0000 mg | ORAL_TABLET | Freq: Every day | ORAL | 3 refills | Status: DC

## 2015-10-04 MED ORDER — ENALAPRIL MALEATE 20 MG PO TABS: 20.0000 mg | ORAL_TABLET | Freq: Every day | ORAL | 3 refills | Status: DC

## 2015-11-15 ENCOUNTER — Other Ambulatory Visit: Payer: Self-pay | Admitting: Family Medicine

## 2015-11-23 ENCOUNTER — Other Ambulatory Visit: Payer: Self-pay

## 2015-11-23 MED ORDER — NITROGLYCERIN 0.4 MG SL SUBL: 0.4000 mg | SUBLINGUAL_TABLET | SUBLINGUAL | 5 refills | Status: DC | PRN

## 2015-11-30 ENCOUNTER — Ambulatory Visit (INDEPENDENT_AMBULATORY_CARE_PROVIDER_SITE_OTHER): Payer: 59 | Admitting: Family Medicine

## 2015-11-30 VITALS — BP 136/74 | HR 106 | Temp 98.4°F | Resp 18 | Wt 190.0 lb

## 2015-11-30 DIAGNOSIS — N410 Acute prostatitis: Secondary | ICD-10-CM

## 2015-11-30 DIAGNOSIS — J0100 Acute maxillary sinusitis, unspecified: Secondary | ICD-10-CM | POA: Diagnosis not present

## 2015-11-30 LAB — POCT URINALYSIS DIPSTICK
Bilirubin, UA: NEGATIVE
GLUCOSE UA: NEGATIVE
Ketones, UA: NEGATIVE
Leukocytes, UA: NEGATIVE
NITRITE UA: NEGATIVE
PH UA: 5
PROTEIN UA: NEGATIVE
Spec Grav, UA: <=1.005
UROBILINOGEN UA: NEGATIVE

## 2015-11-30 MED ORDER — DOXYCYCLINE HYCLATE 100 MG PO TABS
100.0000 mg | ORAL_TABLET | Freq: Two times a day (BID) | ORAL | 1 refills | Status: DC
Start: 2015-11-30 — End: 2015-12-25

## 2015-11-30 NOTE — Progress Notes (Signed)
[name] [name]  MRN: [id number] DOB: 1955-03-26  Subjective:  HPI   The patient is a 60 year old male who presents for evaluation of cough, congestion and fever.  He has had 24 hours of feeling bad.  He states he has head congestion causing him to cough so much it is keeping him up at night.  He has had fever of up to 101 today.   He states he does not feel like there is congestion in his chest but he is having the drainage that is starting to go down to his chest.   Patient Active Problem List   Diagnosis Date Noted  . CAD in native artery 12/07/2014  . Well controlled diabetes mellitus ([place]) 12/07/2014  . Major depressive disorder, single episode, mild ([place]) 12/07/2014  . Essential (primary) hypertension 12/07/2014  . HLD (hyperlipidemia) 12/07/2014  . Adult hypothyroidism 12/07/2014  . Cannot sleep 12/07/2014  . Overweight 12/07/2014  . Raynaud's syndrome 12/07/2014  . Avitaminosis D 12/07/2014  . Acute myocardial infarction 05/31/2006    No past medical history on file.  Social History   Social History  . Marital status: Married    Spouse name: N/A  . Number of children: N/A  . Years of education: N/A   Occupational History  . Not on file.   Social History Main Topics  . Smoking status: Never Smoker  . Smokeless tobacco: Never Used  . Alcohol use Yes     Comment: 2-3 beer on the weekends  . Drug use: No  . Sexual activity: Yes   Other Topics Concern  . Not on file   Social History Narrative  . No narrative on file    Outpatient Encounter Prescriptions as of 11/30/2015  Medication Sig Note  . aspirin 81 MG tablet Take by mouth. 12/07/2014: Received from: [facility]  . atorvastatin (LIPITOR) 20 MG tablet Take 1 tablet (20 mg total) by mouth daily at 6 PM.   . cholecalciferol (VITAMIN D) 1000 UNITS tablet Take by mouth. 12/07/2014: Received from: [facility]  . Coenzyme Q10 100 MG TABS Take by mouth. 12/07/2014: Received  from: [facility]  . enalapril (VASOTEC) 20 MG tablet Take 1 tablet (20 mg total) by mouth daily.   Marland Kitchen ezetimibe (ZETIA) 10 MG tablet Take 1 tablet (10 mg total) by mouth daily.   . fluticasone (FLONASE) 50 MCG/ACT nasal spray Place into the nose. Reported on 08/14/2015 12/07/2014: Received from: [facility]  . lansoprazole (PREVACID 24HR) 15 MG capsule Take by mouth. 12/07/2014: Received from: [facility]  . levothyroxine (SYNTHROID, LEVOTHROID) 75 MCG tablet Take 1 tablet (75 mcg total) by mouth daily before breakfast.   . metFORMIN (GLUCOPHAGE) 1000 MG tablet Take 1 tablet (1,000 mg total) by mouth 2 (two) times daily with a meal.   . metoprolol succinate (TOPROL-XL) 100 MG 24 hr tablet Take 1 tablet (100 mg total) by mouth daily.   . nitroGLYCERIN (NITROSTAT) 0.4 MG SL tablet Place 1 tablet (0.4 mg total) under the tongue every 5 (five) minutes as needed for chest pain.   Marland Kitchen ondansetron (ZOFRAN ODT) 8 MG disintegrating tablet Take by mouth. Reported on 04/25/2015 12/07/2014: Received from: [facility]  . pyridOXINE (VITAMIN B-6) 100 MG tablet Take 100 mg by mouth daily.   . Saw Palmetto 160 MG TABS Take by mouth. 12/07/2014: Received from: [facility]  . sertraline (ZOLOFT) 100 MG tablet TAKE 1 TABLET (100 MG TOTAL) BY  MOUTH DAILY.   Marland Kitchen temazepam (RESTORIL) 30 MG capsule Take 1 capsule (30 mg total) by mouth at bedtime.    No facility-administered encounter medications on file as of 11/30/2015.     No Known Allergies  Review of Systems  Constitutional: Positive for chills, fever and malaise/fatigue.  HENT: Positive for congestion and sore throat (from drainage). Negative for ear discharge, ear pain, hearing loss, nosebleeds and tinnitus.   Eyes: Positive for discharge. Negative for blurred vision, double vision, photophobia, pain and redness.  Respiratory: Positive for cough and sputum production (very  little). Negative for hemoptysis, shortness of breath and wheezing.   Cardiovascular: Negative for chest pain, palpitations, orthopnea, claudication, leg swelling and PND.  Neurological: Positive for weakness and headaches. Negative for dizziness.  Endo/Heme/Allergies: Negative.   Psychiatric/Behavioral: Negative.    Objective:  BP 136/74 (BP Location: Right Arm, Patient Position: Sitting, Cuff Size: Normal)   Pulse (!) 106   Temp 98.4 F (36.9 C) (Oral)   Resp 18   Wt 190 lb (86.2 kg)   SpO2 97%   BMI 27.26 kg/m   Physical Exam  Constitutional: He is oriented to person, place, and time and well-developed, well-nourished, and in no distress.  HENT:  Head: Normocephalic and atraumatic.  Right Ear: External ear normal.  Left Ear: External ear normal.  Nose: Nose normal.  Mouth/Throat: Oropharynx is clear and moist.  Maxillary sinus tenderness   Eyes: Conjunctivae and EOM are normal. Pupils are equal, round, and reactive to light.  Neck: Normal range of motion. Neck supple.  Cardiovascular: Normal rate, regular rhythm, normal heart sounds and intact distal pulses.   Pulmonary/Chest: Effort normal and breath sounds normal.  Abdominal: Soft.  Neurological: He is alert and oriented to person, place, and time.  Skin: Skin is warm and dry.  Psychiatric: Mood, memory, affect and judgment normal.    Assessment and Plan :   1. Acute maxillary sinusitis, recurrence not specified  - doxycycline (VIBRA-TABS) 100 MG tablet; Take 1 tablet (100 mg total) by mouth 2 (two) times daily.  Dispense: 20 tablet; Refill: 1  2. Acute prostatitis  - POCT urinalysis dipstick   HPI, Exam and A&P Transcribed under the direction and in the presence of [name]., MD. Electronically Signed: Althea Charon, RMA I have done the exam and reviewed the chart and it is accurate to the best of my knowledge. Miguel Aschoff M.D. [facility]

## 2015-12-23 ENCOUNTER — Other Ambulatory Visit: Payer: Self-pay | Admitting: Family Medicine

## 2015-12-25 ENCOUNTER — Ambulatory Visit (INDEPENDENT_AMBULATORY_CARE_PROVIDER_SITE_OTHER): Payer: 59 | Admitting: Family Medicine

## 2015-12-25 ENCOUNTER — Encounter: Payer: Self-pay | Admitting: Family Medicine

## 2015-12-25 VITALS — BP 116/78 | HR 80 | Temp 98.0°F | Resp 14 | Wt 188.0 lb

## 2015-12-25 DIAGNOSIS — I2510 Atherosclerotic heart disease of native coronary artery without angina pectoris: Secondary | ICD-10-CM

## 2015-12-25 DIAGNOSIS — G4709 Other insomnia: Secondary | ICD-10-CM | POA: Diagnosis not present

## 2015-12-25 DIAGNOSIS — E039 Hypothyroidism, unspecified: Secondary | ICD-10-CM

## 2015-12-25 DIAGNOSIS — E119 Type 2 diabetes mellitus without complications: Secondary | ICD-10-CM

## 2015-12-25 DIAGNOSIS — F320 Major depressive disorder, single episode, mild: Secondary | ICD-10-CM | POA: Diagnosis not present

## 2015-12-25 DIAGNOSIS — Z23 Encounter for immunization: Secondary | ICD-10-CM | POA: Diagnosis not present

## 2015-12-25 DIAGNOSIS — I10 Essential (primary) hypertension: Secondary | ICD-10-CM | POA: Diagnosis not present

## 2015-12-25 LAB — POCT UA - MICROALBUMIN: MICROALBUMIN (UR) POC: 20 mg/L

## 2015-12-25 LAB — POCT GLYCOSYLATED HEMOGLOBIN (HGB A1C): HEMOGLOBIN A1C: 5.9

## 2015-12-25 MED ORDER — EZETIMIBE 10 MG PO TABS: 10.0000 mg | ORAL_TABLET | Freq: Every day | ORAL | 3 refills | Status: DC

## 2015-12-25 MED ORDER — TEMAZEPAM 30 MG PO CAPS: 30.0000 mg | ORAL_CAPSULE | Freq: Every day | ORAL | 1 refills | Status: DC

## 2015-12-25 MED ORDER — ENALAPRIL MALEATE 20 MG PO TABS: 20.0000 mg | ORAL_TABLET | Freq: Every day | ORAL | 3 refills | Status: DC

## 2015-12-25 NOTE — Progress Notes (Signed)
[name] [name]  MRN: [id number] DOB: 01/10/1956  Subjective:  HPI  Patient is here for 4 months follow up. He checks his b/p and readings have been around 115/65 and 130/72.  BP Readings from Last 3 Encounters:  12/25/15 116/78  11/30/15 136/74  08/14/15 112/68   He checks his sugar and averaging out for fasting around 100-110. No hypoglycemic episodes. No numbness or tingling sensation. His last eye exam was about a year ago. Lab Results  Component Value Date   HGBA1C 6.3 08/14/2015   He is taking Temazepam. He tries to skip 1 to 2 nights and not take the medication but by the 3rd night he can tell a difference. He feels well emotionally on current treatment which is Zoloft, does not feel like medication needs to be adjusted. Reflux is controlled mostly on Prevacid use daily. He has flare ups if he eats too much or certain things and sometimes at that time has to add Tums also. Patient Active Problem List   Diagnosis Date Noted  . CAD in native artery 12/07/2014  . Well controlled diabetes mellitus ([place]) 12/07/2014  . Major depressive disorder, single episode, mild ([place]) 12/07/2014  . Essential (primary) hypertension 12/07/2014  . HLD (hyperlipidemia) 12/07/2014  . Adult hypothyroidism 12/07/2014  . Cannot sleep 12/07/2014  . Overweight 12/07/2014  . Raynaud's syndrome 12/07/2014  . Avitaminosis D 12/07/2014  . Acute myocardial infarction 05/31/2006    No past medical history on file.  Social History   Social History  . Marital status: Married    Spouse name: N/A  . Number of children: N/A  . Years of education: N/A   Occupational History  . Not on file.   Social History Main Topics  . Smoking status: Never Smoker  . Smokeless tobacco: Never Used  . Alcohol use Yes     Comment: 2-3 beer on the weekends  . Drug use: No  . Sexual activity: Yes   Other Topics Concern  . Not on file   Social History Narrative  . No narrative on file    Outpatient  Encounter Prescriptions as of 12/25/2015  Medication Sig Note  . aspirin 81 MG tablet Take by mouth. 12/07/2014: Received from: [facility]  . atorvastatin (LIPITOR) 20 MG tablet Take 1 tablet (20 mg total) by mouth daily at 6 PM.   . cholecalciferol (VITAMIN D) 1000 UNITS tablet Take by mouth. 12/07/2014: Received from: [facility]  . Coenzyme Q10 100 MG TABS Take by mouth. 12/07/2014: Received from: [facility]  . enalapril (VASOTEC) 20 MG tablet Take 1 tablet (20 mg total) by mouth daily.   Marland Kitchen ezetimibe (ZETIA) 10 MG tablet Take 1 tablet (10 mg total) by mouth daily.   . fluticasone (FLONASE) 50 MCG/ACT nasal spray Place into the nose. Reported on 08/14/2015 12/07/2014: Received from: [facility]  . lansoprazole (PREVACID 24HR) 15 MG capsule Take by mouth. 12/07/2014: Received from: [facility]  . levothyroxine (SYNTHROID, LEVOTHROID) 75 MCG tablet Take 1 tablet (75 mcg total) by mouth daily before breakfast.   . metFORMIN (GLUCOPHAGE) 1000 MG tablet Take 1 tablet (1,000 mg total) by mouth 2 (two) times daily with a meal.   . metoprolol succinate (TOPROL-XL) 100 MG 24 hr tablet Take 1 tablet (100 mg total) by mouth daily.   . nitroGLYCERIN (NITROSTAT) 0.4 MG SL tablet Place 1 tablet (0.4 mg total) under the tongue every 5 (five) minutes as needed for chest pain.   Marland Kitchen  ondansetron (ZOFRAN ODT) 8 MG disintegrating tablet Take by mouth. Reported on 04/25/2015 12/07/2014: Received from: [facility]  . pyridOXINE (VITAMIN B-6) 100 MG tablet Take 100 mg by mouth daily.   . Saw Palmetto 160 MG TABS Take by mouth. 12/07/2014: Received from: [facility]  . sertraline (ZOLOFT) 100 MG tablet TAKE 1 TABLET (100 MG TOTAL) BY MOUTH DAILY.   Marland Kitchen temazepam (RESTORIL) 30 MG capsule Take 1 capsule (30 mg total) by mouth at bedtime.   . [DISCONTINUED] doxycycline (VIBRA-TABS) 100 MG tablet Take 1 tablet  (100 mg total) by mouth 2 (two) times daily.    No facility-administered encounter medications on file as of 12/25/2015.     No Known Allergies  Review of Systems  Constitutional: Negative.   Respiratory: Negative.   Cardiovascular: Negative.   Gastrointestinal: Negative.        Controlled on medication  Musculoskeletal: Negative.   Psychiatric/Behavioral: Negative.        Controlled on medication    Objective:  BP 116/78   Pulse 80   Temp 98 F (36.7 C)   Resp 14   Wt 188 lb (85.3 kg)   BMI 26.98 kg/m   Physical Exam  Constitutional: He is oriented to person, place, and time and well-developed, well-nourished, and in no distress.  HENT:  Head: Normocephalic and atraumatic.  Eyes: Conjunctivae are normal. Pupils are equal, round, and reactive to light.  Neck: No thyromegaly present.  Cardiovascular: Normal rate, regular rhythm, normal heart sounds and intact distal pulses.   No murmur heard. Pulmonary/Chest: Effort normal and breath sounds normal. No respiratory distress. He has no wheezes.  Abdominal: Soft.  Musculoskeletal: He exhibits no tenderness.  Neurological: He is alert and oriented to person, place, and time. Gait normal.  Skin: Skin is warm and dry.  Psychiatric: Mood, memory, affect and judgment normal.    Assessment and Plan :  1. Other insomnia Refill given. Advised patient to decrease use of the medication as much as he can. - temazepam (RESTORIL) 30 MG capsule; Take 1 capsule (30 mg total) by mouth at bedtime.  Dispense: 90 capsule; Refill: 1  2. Essential (primary) hypertension Stable.  3. Well controlled diabetes mellitus ([place]) A1C 5.9. Better. Re check in 4 months for CPE. 4. Adult hypothyroidism Re check level.  5. Coronary artery disease involving native coronary artery of native heart without angina pectoris Seen cardiologist. Doing well.  6. Major depressive disorder, single episode, mild (H[facility]) Stable on medication.  7. Need for  influenza vaccination Administered today  HPI, Exam and A&P transcribed under direction and in the presence of Miguel Aschoff, MD. I have done the exam and reviewed the chart and it is accurate to the best of my knowledge. Development worker, community has been used and  any errors in dictation or transcription are unintentional. Miguel Aschoff M.D. [facility]

## 2015-12-26 ENCOUNTER — Telehealth: Payer: Self-pay

## 2015-12-26 LAB — COMPREHENSIVE METABOLIC PANEL
A/G RATIO: 1.9 (ref 1.2–2.2)
ALK PHOS: 83 IU/L (ref 39–117)
ALT: 37 IU/L (ref 0–44)
AST: 37 IU/L (ref 0–40)
Albumin: 4.5 g/dL (ref 3.5–5.5)
BUN/Creatinine Ratio: 19 (ref 9–20)
BUN: 17 mg/dL (ref 6–24)
Bilirubin Total: 0.7 mg/dL (ref 0.0–1.2)
CO2: 23 mmol/L (ref 18–29)
Calcium: 9.7 mg/dL (ref 8.7–10.2)
Chloride: 103 mmol/L (ref 96–106)
Creatinine, Ser: 0.9 mg/dL (ref 0.76–1.27)
GFR calc Af Amer: 108 mL/min/{1.73_m2} (ref >59)
GFR calc non Af Amer: 93 mL/min/{1.73_m2} (ref >59)
GLOBULIN, TOTAL: 2.4 g/dL (ref 1.5–4.5)
Glucose: 123 mg/dL — ABNORMAL HIGH (ref 65–99)
POTASSIUM: 5.5 mmol/L — AB (ref 3.5–5.2)
SODIUM: 140 mmol/L (ref 134–144)
Total Protein: 6.9 g/dL (ref 6.0–8.5)

## 2015-12-26 LAB — TSH: TSH: 2.12 u[IU]/mL (ref 0.450–4.500)

## 2015-12-26 NOTE — Telephone Encounter (Signed)
-----   Message from Jerrol Banana., MD sent at 12/26/2015  8:15 AM EST ----- Labs stable.

## 2015-12-26 NOTE — Telephone Encounter (Signed)
Unable to reach patient at this time, no voicemail set up yet. KW

## 2015-12-28 NOTE — Telephone Encounter (Signed)
Patient has been advised. KW 

## 2016-02-28 LAB — HM DIABETES EYE EXAM

## 2016-03-07 ENCOUNTER — Encounter: Payer: Self-pay | Admitting: Family Medicine

## 2016-03-27 ENCOUNTER — Other Ambulatory Visit: Payer: Self-pay

## 2016-03-27 MED ORDER — GLUCOSE BLOOD VI STRP: ORAL_STRIP | 3 refills | Status: DC

## 2016-03-29 ENCOUNTER — Other Ambulatory Visit: Payer: Self-pay | Admitting: Physician Assistant

## 2016-03-29 DIAGNOSIS — F329 Major depressive disorder, single episode, unspecified: Secondary | ICD-10-CM

## 2016-03-29 DIAGNOSIS — F32A Depression, unspecified: Secondary | ICD-10-CM

## 2016-04-17 ENCOUNTER — Encounter: Payer: Self-pay | Admitting: Family Medicine

## 2016-04-17 ENCOUNTER — Ambulatory Visit (INDEPENDENT_AMBULATORY_CARE_PROVIDER_SITE_OTHER): Payer: 59 | Admitting: Family Medicine

## 2016-04-17 VITALS — BP 118/82 | HR 94 | Temp 98.0°F | Resp 14 | Ht 70.0 in | Wt 189.0 lb

## 2016-04-17 DIAGNOSIS — Z1211 Encounter for screening for malignant neoplasm of colon: Secondary | ICD-10-CM

## 2016-04-17 DIAGNOSIS — J302 Other seasonal allergic rhinitis: Secondary | ICD-10-CM | POA: Diagnosis not present

## 2016-04-17 DIAGNOSIS — I10 Essential (primary) hypertension: Secondary | ICD-10-CM

## 2016-04-17 DIAGNOSIS — E119 Type 2 diabetes mellitus without complications: Secondary | ICD-10-CM

## 2016-04-17 DIAGNOSIS — T783XXA Angioneurotic edema, initial encounter: Secondary | ICD-10-CM

## 2016-04-17 DIAGNOSIS — Z125 Encounter for screening for malignant neoplasm of prostate: Secondary | ICD-10-CM

## 2016-04-17 DIAGNOSIS — Z0000 Encounter for general adult medical examination without abnormal findings: Secondary | ICD-10-CM | POA: Diagnosis not present

## 2016-04-17 DIAGNOSIS — E039 Hypothyroidism, unspecified: Secondary | ICD-10-CM

## 2016-04-17 LAB — POCT URINALYSIS DIPSTICK
Bilirubin, UA: NEGATIVE
Blood, UA: NEGATIVE
Glucose, UA: NEGATIVE
Ketones, UA: NEGATIVE
LEUKOCYTES UA: NEGATIVE
NITRITE UA: NEGATIVE
PROTEIN UA: NEGATIVE
SPEC GRAV UA: 1.015 (ref 1.030–1.035)
UROBILINOGEN UA: 0.2 (ref ?–2.0)
pH, UA: 6 (ref 5.0–8.0)

## 2016-04-17 LAB — IFOBT (OCCULT BLOOD): IMMUNOLOGICAL FECAL OCCULT BLOOD TEST: NEGATIVE

## 2016-04-17 MED ORDER — MONTELUKAST SODIUM 10 MG PO TABS: 10.0000 mg | ORAL_TABLET | Freq: Every day | ORAL | 3 refills | Status: DC

## 2016-04-17 NOTE — Progress Notes (Signed)
Patient: [name] [name], Male    DOB: 11-Jun-1955, 61 y.o.   MRN: [id number] Visit Date: 04/17/2016  Today's Provider: [name], MD   Chief Complaint  Patient presents with  . Annual Exam   Subjective:  [name] [name] is a 61 y.o. male who presents today for health maintenance and complete physical. He feels fairly well. 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 He reports he is sleeping well. Pt has new 62 week old granddaughter. Immunization History  Administered Date(s) Administered  . Influenza,inj,Quad PF,36+ Mos 12/25/2015  . Pneumococcal Conjugate-13 01/05/2013  . Tdap 02/09/2008   Last colonoscopy 08/30/10 hemorrhoids, otherwise normal repeat 5 to 10 years.  Review of Systems  Constitutional: Positive for appetite change and fatigue.  HENT: Positive for congestion, facial swelling, postnasal drip and sneezing.   Eyes: Positive for redness and itching.  Respiratory: Positive for cough.   Cardiovascular: Negative.   Gastrointestinal: Negative.   Endocrine: Negative.   Genitourinary: Negative.   Musculoskeletal: Positive for arthralgias and back pain.  Skin: Negative.   Allergic/Immunologic: Negative.   Neurological: Positive for numbness.  Hematological: Negative.   Psychiatric/Behavioral: Negative.     Social History   Social History  . Marital status: Married    Spouse name: N/A  . Number of children: N/A  . Years of education: N/A   Occupational History  . Not on file.   Social History Main Topics  . Smoking status: Never Smoker  . Smokeless tobacco: Never Used  . Alcohol use Yes     Comment: 2-3 beer on the weekends  . Drug use: No  . Sexual activity: Yes   Other Topics Concern  . Not on file   Social History Narrative  . No narrative on file    Patient Active Problem List   Diagnosis Date Noted  . Coronary artery disease involving native coronary artery of native heart without  angina pectoris 12/07/2014  . Well controlled diabetes mellitus ([place]) 12/07/2014  . Major depressive disorder, single episode, mild ([place]) 12/07/2014  . Essential (primary) hypertension 12/07/2014  . HLD (hyperlipidemia) 12/07/2014  . Adult hypothyroidism 12/07/2014  . Cannot sleep 12/07/2014  . Overweight 12/07/2014  . Raynaud's syndrome 12/07/2014  . Avitaminosis D 12/07/2014  . Acute myocardial infarction 05/31/2006    Past Surgical History:  Procedure Laterality Date  . CARDIAC SURGERY  05/30/05   stent placement    His family history includes Diabetes in his mother; Glaucoma in his mother; Gout in his mother; Heart disease in his father and mother; Hypertension in his father, mother, sister, and sister; Prostate cancer in his father; Skin cancer in his mother; Ulcers in his mother.     Outpatient Encounter Prescriptions as of 04/17/2016  Medication Sig Note  . aspirin 81 MG tablet Take by mouth. 12/07/2014: Received from: [facility]  . atorvastatin (LIPITOR) 20 MG tablet Take 1 tablet (20 mg total) by mouth daily at 6 PM.   . cholecalciferol (VITAMIN D) 1000 UNITS tablet Take by mouth. 12/07/2014: Received from: [facility]  . Coenzyme Q10 100 MG TABS Take by mouth. 12/07/2014: Received from: [facility]  . enalapril (VASOTEC) 20 MG tablet Take 1 tablet (20 mg total) by mouth daily.   Marland Kitchen ezetimibe (ZETIA) 10 MG tablet Take 1 tablet (10 mg total) by mouth daily.   . fluticasone (FLONASE) 50 MCG/ACT nasal spray Place into the nose. Reported on 08/14/2015  12/07/2014: Received from: [facility]  . glucose blood test strip Check sugar once daily. DX E11.9   . lansoprazole (PREVACID 24HR) 15 MG capsule Take by mouth. 12/07/2014: Received from: [facility]  . levothyroxine (SYNTHROID, LEVOTHROID) 75 MCG tablet TAKE 1 TABLET BY MOUTH  DAILY BEFORE BREAKFAST   . metFORMIN (GLUCOPHAGE) 1000 MG tablet Take 1  tablet (1,000 mg total) by mouth 2 (two) times daily with a meal.   . metoprolol succinate (TOPROL-XL) 100 MG 24 hr tablet Take 1 tablet (100 mg total) by mouth daily.   . nitroGLYCERIN (NITROSTAT) 0.4 MG SL tablet Place 1 tablet (0.4 mg total) under the tongue every 5 (five) minutes as needed for chest pain.   Marland Kitchen ondansetron (ZOFRAN ODT) 8 MG disintegrating tablet Take by mouth. Reported on 04/25/2015 12/07/2014: Received from: [facility]  . pyridOXINE (VITAMIN B-6) 100 MG tablet Take 100 mg by mouth daily.   . Saw Palmetto 160 MG TABS Take by mouth. 12/07/2014: Received from: [facility]  . sertraline (ZOLOFT) 100 MG tablet TAKE 1 TABLET (100 MG TOTAL) BY MOUTH DAILY.   Marland Kitchen temazepam (RESTORIL) 30 MG capsule Take 1 capsule (30 mg total) by mouth at bedtime.   . [DISCONTINUED] sertraline (ZOLOFT) 100 MG tablet TAKE 1 TABLET BY MOUTH  DAILY    No facility-administered encounter medications on file as of 04/17/2016.     Patient Care Team: Jerrol Banana., MD as PCP - General (Family Medicine)      Objective:   Vitals:  Vitals:   04/17/16 0910  BP: 118/82  Pulse: 94  Resp: 14  Temp: 98 F (36.7 C)  Weight: 189 lb (85.7 kg)  Height: 5\' 10"  (1.778 m)    Physical Exam  Constitutional: He is oriented to person, place, and time. He appears well-developed and well-nourished.  HENT:  Head: Normocephalic and atraumatic.  Right Ear: External ear normal.  Left Ear: External ear normal.  Nose: Nose normal.  Mouth/Throat: Oropharynx is clear and moist.  Eyes: Conjunctivae are normal. Pupils are equal, round, and reactive to light. No scleral icterus.  Neck: Normal range of motion. Neck supple. No thyromegaly present.  Cardiovascular: Normal rate, regular rhythm, normal heart sounds and intact distal pulses.   No murmur heard. Pulmonary/Chest: Effort normal and breath sounds normal. No respiratory distress. He has no wheezes.  Abdominal: Soft. He  exhibits no distension. There is no tenderness. There is no rebound.  Genitourinary: Rectum normal, prostate normal and penis normal. Rectal exam shows guaiac negative stool. No penile tenderness.  Musculoskeletal: He exhibits no tenderness.  Neurological: He is alert and oriented to person, place, and time.  Skin: Skin is warm and dry. No rash noted. No erythema.  Psychiatric: He has a normal mood and affect. His behavior is normal. Judgment and thought content normal.     Depression Screen PHQ 2/9 Scores 04/17/2016 04/25/2015 12/13/2014  PHQ - 2 Score 0 0 0  PHQ- 9 Score 1 - -   Assessment & Plan:   1. Annual physical exam - CBC w/Diff/Platelet - Comprehensive metabolic panel - Lipid Panel With LDL/HDL Ratio - TSH - POCT Urinalysis Dipstick  2. Colon cancer screening - IFOBT POC (occult bld, rslt in office)  3. Prostate cancer screening - PSA  4. Essential (primary) hypertension Stable. - CBC w/Diff/Platelet - Comprehensive metabolic panel  5. Well controlled diabetes mellitus ([place]) Check labs today. - HgB A1c  6. Adult hypothyroidism 7. Angioedema of  lips, initial encounter Had 1 episode of this on 04/14/16. Resolved at this time. Patient advised it could be from Enalapril. Patient to call us if this happens again and will stop this medication at that time to see if symptoms resolve.  8. Acute seasonal allergic rhinitis due to other allergen Try Singulair.  HPI, Exam and A&P transcribed under direction and in the presence of Miguel Aschoff, MD.

## 2016-04-18 LAB — CBC WITH DIFFERENTIAL/PLATELET
Basophils Absolute: 0.1 10*3/uL (ref 0.0–0.2)
Basos: 1 %
EOS (ABSOLUTE): 0.1 10*3/uL (ref 0.0–0.4)
EOS: 2 %
HEMATOCRIT: 43.5 % (ref 37.5–51.0)
Hemoglobin: 14.4 g/dL (ref 13.0–17.7)
Immature Grans (Abs): 0 10*3/uL (ref 0.0–0.1)
Immature Granulocytes: 0 %
LYMPHS ABS: 1.9 10*3/uL (ref 0.7–3.1)
Lymphs: 32 %
MCH: 29.6 pg (ref 26.6–33.0)
MCHC: 33.1 g/dL (ref 31.5–35.7)
MCV: 89 fL (ref 79–97)
MONOS ABS: 0.7 10*3/uL (ref 0.1–0.9)
Monocytes: 11 %
NEUTROS ABS: 3.1 10*3/uL (ref 1.4–7.0)
Neutrophils: 54 %
Platelets: 263 10*3/uL (ref 150–379)
RBC: 4.87 x10E6/uL (ref 4.14–5.80)
RDW: 14.1 % (ref 12.3–15.4)
WBC: 5.9 10*3/uL (ref 3.4–10.8)

## 2016-04-18 LAB — LIPID PANEL WITH LDL/HDL RATIO
CHOLESTEROL TOTAL: 106 mg/dL (ref 100–199)
HDL: 45 mg/dL (ref >39)
LDL Calculated: 39 mg/dL (ref 0–99)
LDl/HDL Ratio: 0.9 ratio units (ref 0.0–3.6)
Triglycerides: 111 mg/dL (ref 0–149)
VLDL CHOLESTEROL CAL: 22 mg/dL (ref 5–40)

## 2016-04-18 LAB — COMPREHENSIVE METABOLIC PANEL
ALBUMIN: 4.8 g/dL (ref 3.6–4.8)
ALK PHOS: 75 IU/L (ref 39–117)
ALT: 42 IU/L (ref 0–44)
AST: 37 IU/L (ref 0–40)
Albumin/Globulin Ratio: 1.8 (ref 1.2–2.2)
BILIRUBIN TOTAL: 0.6 mg/dL (ref 0.0–1.2)
BUN/Creatinine Ratio: 24 (ref 10–24)
BUN: 22 mg/dL (ref 8–27)
CHLORIDE: 101 mmol/L (ref 96–106)
CO2: 25 mmol/L (ref 18–29)
Calcium: 10.6 mg/dL — ABNORMAL HIGH (ref 8.6–10.2)
Creatinine, Ser: 0.93 mg/dL (ref 0.76–1.27)
GFR calc Af Amer: 103 mL/min/{1.73_m2} (ref >59)
GFR calc non Af Amer: 89 mL/min/{1.73_m2} (ref >59)
GLOBULIN, TOTAL: 2.7 g/dL (ref 1.5–4.5)
Glucose: 132 mg/dL — ABNORMAL HIGH (ref 65–99)
Potassium: 5.9 mmol/L — ABNORMAL HIGH (ref 3.5–5.2)
Sodium: 139 mmol/L (ref 134–144)
Total Protein: 7.5 g/dL (ref 6.0–8.5)

## 2016-04-18 LAB — HEMOGLOBIN A1C
Est. average glucose Bld gHb Est-mCnc: 128 mg/dL
HEMOGLOBIN A1C: 6.1 % — AB (ref 4.8–5.6)

## 2016-04-18 LAB — TSH: TSH: 2.58 u[IU]/mL (ref 0.450–4.500)

## 2016-04-18 LAB — PSA: Prostate Specific Ag, Serum: 1.5 ng/mL (ref 0.0–4.0)

## 2016-04-18 NOTE — Progress Notes (Signed)
Advised  ED 

## 2016-04-25 ENCOUNTER — Encounter: Payer: 59 | Admitting: Family Medicine

## 2016-05-15 ENCOUNTER — Other Ambulatory Visit: Payer: Self-pay | Admitting: Emergency Medicine

## 2016-05-15 DIAGNOSIS — E875 Hyperkalemia: Secondary | ICD-10-CM | POA: Diagnosis not present

## 2016-05-15 NOTE — Progress Notes (Signed)
Per Dr. Gilbert verbal order 

## 2016-05-16 LAB — CALCIUM, IONIZED: CALCIUM ION: 5.4 mg/dL (ref 4.5–5.6)

## 2016-05-16 LAB — RENAL FUNCTION PANEL
Albumin: 4.6 g/dL (ref 3.6–4.8)
BUN / CREAT RATIO: 20 (ref 10–24)
BUN: 21 mg/dL (ref 8–27)
CHLORIDE: 103 mmol/L (ref 96–106)
CO2: 25 mmol/L (ref 18–29)
Calcium: 10.4 mg/dL — ABNORMAL HIGH (ref 8.6–10.2)
Creatinine, Ser: 1.06 mg/dL (ref 0.76–1.27)
GFR, EST AFRICAN AMERICAN: 88 mL/min/{1.73_m2} (ref >59)
GFR, EST NON AFRICAN AMERICAN: 76 mL/min/{1.73_m2} (ref >59)
GLUCOSE: 76 mg/dL (ref 65–99)
POTASSIUM: 5.1 mmol/L (ref 3.5–5.2)
Phosphorus: 3.9 mg/dL (ref 2.5–4.5)
Sodium: 142 mmol/L (ref 134–144)

## 2016-05-16 LAB — PTH, INTACT AND CALCIUM: PTH: 17 pg/mL (ref 15–65)

## 2016-06-11 ENCOUNTER — Other Ambulatory Visit: Payer: Self-pay | Admitting: Family Medicine

## 2016-06-19 ENCOUNTER — Other Ambulatory Visit: Payer: Self-pay

## 2016-06-19 DIAGNOSIS — G4709 Other insomnia: Secondary | ICD-10-CM

## 2016-06-20 MED ORDER — TEMAZEPAM 30 MG PO CAPS: 30.0000 mg | ORAL_CAPSULE | Freq: Every day | ORAL | 1 refills | Status: DC

## 2016-07-10 ENCOUNTER — Other Ambulatory Visit: Payer: Self-pay

## 2016-07-10 DIAGNOSIS — G4709 Other insomnia: Secondary | ICD-10-CM

## 2016-07-10 NOTE — Telephone Encounter (Signed)
Patient's wife Neoma Laming is requesting RX be called in at [facility]. Last RX was called in at [facility] and she states they only gave patient a 30 day supply. Please advise.

## 2016-07-10 NOTE — Telephone Encounter (Signed)
LMTCB. Need to know which rx needs to be called in.

## 2016-07-11 MED ORDER — TEMAZEPAM 30 MG PO CAPS: 30.0000 mg | ORAL_CAPSULE | Freq: Every day | ORAL | 1 refills | Status: DC

## 2016-07-11 NOTE — Telephone Encounter (Signed)
temazepam (RESTORIL) 30 MG capsule sorry forgot to put in message.

## 2016-08-13 ENCOUNTER — Other Ambulatory Visit: Payer: Self-pay | Admitting: Family Medicine

## 2016-08-21 ENCOUNTER — Ambulatory Visit (INDEPENDENT_AMBULATORY_CARE_PROVIDER_SITE_OTHER): Payer: 59 | Admitting: Family Medicine

## 2016-08-21 VITALS — BP 118/78 | HR 78 | Temp 97.9°F | Resp 16 | Wt 192.0 lb

## 2016-08-21 DIAGNOSIS — E119 Type 2 diabetes mellitus without complications: Secondary | ICD-10-CM

## 2016-08-21 DIAGNOSIS — I10 Essential (primary) hypertension: Secondary | ICD-10-CM

## 2016-08-21 LAB — POCT GLYCOSYLATED HEMOGLOBIN (HGB A1C): Hemoglobin A1C: 6.5

## 2016-08-21 NOTE — Progress Notes (Signed)
[name] [name]  MRN: [id number] DOB: 20-Jun-1955  Subjective:  HPI   The patient is a 61 year old male who presents for follow up of his diabetes and hypertension.   His last visit was on 04/17/16 and his blood pressure was 118/62.  At that time he reported an episode of swelling of the lips.  He was instructed to discontinue his Enalapril.  He reports he has not had any issues since stopping the medicine. He is also here for his diabetes.  His last A1C on 04/17/16 was 6.1.  He reports that he has been checking his glucose at home and he has been getting readings that have run 100-160.  He is due to have his foot exam today.  He denies any symptoms suggestive of hypoglycemia. The patient also had his other labs done on the last visit.  His Calcium and Potassium were found to be elevated.  These were back to normal on repeat lab a few weeks later.  Patient Active Problem List   Diagnosis Date Noted  . Coronary artery disease involving native coronary artery of native heart without angina pectoris 12/07/2014  . Well controlled diabetes mellitus ([place]) 12/07/2014  . Major depressive disorder, single episode, mild ([place]) 12/07/2014  . Essential (primary) hypertension 12/07/2014  . HLD (hyperlipidemia) 12/07/2014  . Adult hypothyroidism 12/07/2014  . Cannot sleep 12/07/2014  . Overweight 12/07/2014  . Raynaud's syndrome 12/07/2014  . Avitaminosis D 12/07/2014  . Acute myocardial infarction ([place]) 05/31/2006    No past medical history on file.  Social History   Social History  . Marital status: Married    Spouse name: N/A  . Number of children: N/A  . Years of education: N/A   Occupational History  . Not on file.   Social History Main Topics  . Smoking status: Never Smoker  . Smokeless tobacco: Never Used  . Alcohol use Yes     Comment: 2-3 beer on the weekends  . Drug use: No  . Sexual activity: Yes   Other Topics Concern  . Not on file   Social History Narrative  . No  narrative on file    Outpatient Encounter Prescriptions as of 08/21/2016  Medication Sig Note  . aspirin 81 MG tablet Take by mouth. 12/07/2014: Received from: [facility]  . atorvastatin (LIPITOR) 20 MG tablet TAKE 1 TABLET BY MOUTH  DAILY AT 6 PM.   . cholecalciferol (VITAMIN D) 1000 UNITS tablet Take by mouth. 12/07/2014: Received from: [facility]  . Coenzyme Q10 100 MG TABS Take by mouth. 12/07/2014: Received from: [facility]  . ezetimibe (ZETIA) 10 MG tablet Take 1 tablet (10 mg total) by mouth daily.   . fluticasone (FLONASE) 50 MCG/ACT nasal spray Place into the nose. Reported on 08/14/2015 12/07/2014: Received from: [facility]  . glucose blood test strip Check sugar once daily. DX E11.9   . lansoprazole (PREVACID 24HR) 15 MG capsule Take by mouth. 12/07/2014: Received from: [facility]  . levothyroxine (SYNTHROID, LEVOTHROID) 75 MCG tablet TAKE 1 TABLET BY MOUTH  DAILY BEFORE BREAKFAST   . metFORMIN (GLUCOPHAGE) 1000 MG tablet TAKE 1 TABLET BY MOUTH TWO  TIMES DAILY WITH A MEAL   . metoprolol succinate (TOPROL-XL) 100 MG 24 hr tablet TAKE 1 TABLET BY MOUTH  DAILY   . montelukast (SINGULAIR) 10 MG tablet Take 1 tablet (10 mg total) by mouth at bedtime.   . nitroGLYCERIN (NITROSTAT) 0.4 MG SL tablet Place  1 tablet (0.4 mg total) under the tongue every 5 (five) minutes as needed for chest pain.   Marland Kitchen ondansetron (ZOFRAN ODT) 8 MG disintegrating tablet Take by mouth. Reported on 04/25/2015 12/07/2014: Received from: [facility]  . pyridOXINE (VITAMIN B-6) 100 MG tablet Take 100 mg by mouth daily.   . Saw Palmetto 160 MG TABS Take by mouth. 12/07/2014: Received from: [facility]  . sertraline (ZOLOFT) 100 MG tablet TAKE 1 TABLET (100 MG TOTAL) BY MOUTH DAILY.   Marland Kitchen temazepam (RESTORIL) 30 MG capsule Take 1 capsule (30 mg total) by mouth at bedtime.   . [DISCONTINUED] metFORMIN  (GLUCOPHAGE) 1000 MG tablet Take 1 tablet (1,000 mg total) by mouth 2 (two) times daily with a meal.   . [DISCONTINUED] enalapril (VASOTEC) 20 MG tablet Take 1 tablet (20 mg total) by mouth daily.    No facility-administered encounter medications on file as of 08/21/2016.     No Known Allergies  Review of Systems  Constitutional: Negative for fever and malaise/fatigue.  HENT: Negative.   Eyes: Negative.   Respiratory: Negative for cough, shortness of breath and wheezing.   Cardiovascular: Negative for chest pain, palpitations, orthopnea, claudication and leg swelling.  Gastrointestinal: Negative.   Genitourinary: Negative for frequency.  Skin: Negative.   Neurological: Negative.  Negative for weakness.  Endo/Heme/Allergies: Negative for polydipsia.  Psychiatric/Behavioral: Negative.     Objective:  BP 118/78 (BP Location: Right Arm, Patient Position: Sitting, Cuff Size: Normal)   Pulse 78   Temp 97.9 F (36.6 C) (Oral)   Resp 16   Wt 192 lb (87.1 kg)   BMI 27.55 kg/m   Physical Exam  Constitutional: He is well-developed, well-nourished, and in no distress.  HENT:  Head: Normocephalic and atraumatic.  Right Ear: External ear normal.  Left Ear: External ear normal.  Nose: Nose normal.  Eyes: Pupils are equal, round, and reactive to light. Conjunctivae are normal. No scleral icterus.  Neck: Normal range of motion. No thyromegaly present.  Cardiovascular: Normal rate, regular rhythm and normal heart sounds.   Pulmonary/Chest: Effort normal and breath sounds normal.  Abdominal: Soft.  Neurological: He is alert. Gait normal.  Skin: Skin is warm and dry.  Psychiatric: Mood, memory, affect and judgment normal.    Assessment and Plan :   1. Well controlled diabetes mellitus ([name])  - POCT glycosylated hemoglobin (Hb A1C)--6.5 today  2. Essential (primary) hypertension  3.CAD All risk factors treated.  HPI, Exam and A&P Transcribed under the direction and in the  presence of Miguel Aschoff, Brooke Bonito., MD. Electronically Signed: Althea Charon, RMA I have done the exam and reviewed the chart and it is accurate to the best of my knowledge. Development worker, community has been used and  any errors in dictation or transcription are unintentional. Miguel Aschoff M.D. [facility]

## 2016-11-05 DIAGNOSIS — I2510 Atherosclerotic heart disease of native coronary artery without angina pectoris: Secondary | ICD-10-CM | POA: Diagnosis not present

## 2016-11-05 DIAGNOSIS — E782 Mixed hyperlipidemia: Secondary | ICD-10-CM | POA: Diagnosis not present

## 2016-11-20 ENCOUNTER — Other Ambulatory Visit: Payer: Self-pay

## 2016-11-20 MED ORDER — GLUCOSE BLOOD VI STRP: ORAL_STRIP | 3 refills | Status: DC

## 2016-11-20 MED ORDER — GLUCOSE BLOOD VI STRP: ORAL_STRIP | 0 refills | Status: DC

## 2016-11-24 ENCOUNTER — Other Ambulatory Visit: Payer: Self-pay | Admitting: Family Medicine

## 2016-12-16 ENCOUNTER — Ambulatory Visit: Payer: 59 | Admitting: Family Medicine

## 2016-12-16 ENCOUNTER — Other Ambulatory Visit: Payer: Self-pay

## 2016-12-16 VITALS — BP 118/80 | HR 80 | Temp 97.7°F | Resp 16 | Wt 184.0 lb

## 2016-12-16 DIAGNOSIS — F320 Major depressive disorder, single episode, mild: Secondary | ICD-10-CM | POA: Diagnosis not present

## 2016-12-16 DIAGNOSIS — E039 Hypothyroidism, unspecified: Secondary | ICD-10-CM

## 2016-12-16 DIAGNOSIS — G4709 Other insomnia: Secondary | ICD-10-CM | POA: Diagnosis not present

## 2016-12-16 DIAGNOSIS — Z23 Encounter for immunization: Secondary | ICD-10-CM | POA: Diagnosis not present

## 2016-12-16 DIAGNOSIS — I10 Essential (primary) hypertension: Secondary | ICD-10-CM | POA: Diagnosis not present

## 2016-12-16 DIAGNOSIS — E119 Type 2 diabetes mellitus without complications: Secondary | ICD-10-CM

## 2016-12-16 DIAGNOSIS — E785 Hyperlipidemia, unspecified: Secondary | ICD-10-CM | POA: Diagnosis not present

## 2016-12-16 DIAGNOSIS — M7130 Other bursal cyst, unspecified site: Secondary | ICD-10-CM

## 2016-12-16 LAB — POCT GLYCOSYLATED HEMOGLOBIN (HGB A1C): Hemoglobin A1C: 6.3

## 2016-12-16 MED ORDER — EZETIMIBE 10 MG PO TABS: 10.0000 mg | ORAL_TABLET | Freq: Every day | ORAL | 3 refills | Status: DC

## 2016-12-16 MED ORDER — ATORVASTATIN CALCIUM 20 MG PO TABS: 20.0000 mg | ORAL_TABLET | Freq: Every day | ORAL | 3 refills | Status: DC

## 2016-12-16 MED ORDER — METOPROLOL SUCCINATE ER 100 MG PO TB24: 100.0000 mg | ORAL_TABLET | Freq: Every day | ORAL | 3 refills | Status: DC

## 2016-12-16 MED ORDER — TEMAZEPAM 30 MG PO CAPS: 30.0000 mg | ORAL_CAPSULE | Freq: Every day | ORAL | 1 refills | Status: DC

## 2016-12-16 MED ORDER — GLUCOSE BLOOD VI STRP: ORAL_STRIP | 3 refills | Status: DC

## 2016-12-16 MED ORDER — MELOXICAM 15 MG PO TABS: 15.0000 mg | ORAL_TABLET | Freq: Every day | ORAL | 0 refills | Status: DC

## 2016-12-16 MED ORDER — LEVOTHYROXINE SODIUM 75 MCG PO TABS: 75.0000 ug | ORAL_TABLET | Freq: Every day | ORAL | 3 refills | Status: DC

## 2016-12-16 MED ORDER — METFORMIN HCL 1000 MG PO TABS: 1000.0000 mg | ORAL_TABLET | Freq: Two times a day (BID) | ORAL | 3 refills | Status: DC

## 2016-12-16 MED ORDER — SERTRALINE HCL 100 MG PO TABS: 100.0000 mg | ORAL_TABLET | Freq: Every day | ORAL | 3 refills | Status: DC

## 2016-12-16 NOTE — Progress Notes (Signed)
[name] [name]  MRN: [id number] DOB: 10-21-1955  Subjective:  HPI   The patient is a 61 year old male who presents for follow up of his diabetes. His last visit was 08/21/16.  At that time his A1C was 6.5  The patient checks his glucose and reports that most of his readings have been 100-160 with a few around 250.  He states that the ones that were 250 were possibly with old strips.  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  He admits that he did not eat properly.  When he woke during the night his glucose was 60.  He drank some fruit juice and part of a candy bar and crackers.  This helped bring it up to 120.   The patient does need his medicines refilled today.  Along with flu shot.   Patient Active Problem List   Diagnosis Date Noted  . Coronary artery disease involving native coronary artery of native heart without angina pectoris 12/07/2014  . Well controlled diabetes mellitus ([place]) 12/07/2014  . Major depressive disorder, single episode, mild ([place]) 12/07/2014  . Essential (primary) hypertension 12/07/2014  . HLD (hyperlipidemia) 12/07/2014  . Adult hypothyroidism 12/07/2014  . Cannot sleep 12/07/2014  . Overweight 12/07/2014  . Raynaud's syndrome 12/07/2014  . Avitaminosis D 12/07/2014  . Acute myocardial infarction ([place]) 05/31/2006    No past medical history on file.  Social History   Socioeconomic History  . Marital status: Married    Spouse name: Not on file  . Number of children: Not on file  . Years of education: Not on file  . Highest education level: Not on file  Social Needs  . Financial resource strain: Not on file  . Food insecurity - worry: Not on file  . Food insecurity - inability: Not on file  . Transportation needs - medical: Not on file  . Transportation needs - non-medical: Not on file  Occupational History  . Not on file  Tobacco Use  . Smoking status: Never Smoker  . Smokeless tobacco: Never Used    Substance and Sexual Activity  . Alcohol use: Yes    Comment: 2-3 beer on the weekends  . Drug use: No  . Sexual activity: Yes  Other Topics Concern  . Not on file  Social History Narrative  . Not on file    Outpatient Encounter Medications as of 12/16/2016  Medication Sig Note  . aspirin 81 MG tablet Take by mouth. 12/07/2014: Received from: [facility]  . atorvastatin (LIPITOR) 20 MG tablet TAKE 1 TABLET BY MOUTH  DAILY AT 6 PM.   . cholecalciferol (VITAMIN D) 1000 UNITS tablet Take by mouth. 12/07/2014: Received from: [facility]  . Coenzyme Q10 100 MG TABS Take by mouth. 12/07/2014: Received from: [facility]  . ezetimibe (ZETIA) 10 MG tablet TAKE 1 TABLET BY MOUTH  DAILY   . fluticasone (FLONASE) 50 MCG/ACT nasal spray Place into the nose. Reported on 08/14/2015 12/07/2014: Received from: [facility]  . glucose blood test strip Check sugar once daily. DX E11.9   . lansoprazole (PREVACID 24HR) 15 MG capsule Take by mouth. 12/07/2014: Received from: [facility]  . levothyroxine (SYNTHROID, LEVOTHROID) 75 MCG tablet TAKE 1 TABLET BY MOUTH  DAILY BEFORE BREAKFAST   . metFORMIN (GLUCOPHAGE) 1000 MG tablet TAKE 1 TABLET BY MOUTH TWO  TIMES DAILY WITH A MEAL   . metoprolol succinate (TOPROL-XL) 100  MG 24 hr tablet TAKE 1 TABLET BY MOUTH  DAILY   . montelukast (SINGULAIR) 10 MG tablet Take 1 tablet (10 mg total) by mouth at bedtime.   . nitroGLYCERIN (NITROSTAT) 0.4 MG SL tablet Place 1 tablet (0.4 mg total) under the tongue every 5 (five) minutes as needed for chest pain.   Marland Kitchen ondansetron (ZOFRAN ODT) 8 MG disintegrating tablet Take by mouth. Reported on 04/25/2015 12/07/2014: Received from: [facility]  . pyridOXINE (VITAMIN B-6) 100 MG tablet Take 100 mg by mouth daily.   . Saw Palmetto 160 MG TABS Take by mouth. 12/07/2014: Received from: [facility]  . sertraline  (ZOLOFT) 100 MG tablet TAKE 1 TABLET (100 MG TOTAL) BY MOUTH DAILY.   Marland Kitchen temazepam (RESTORIL) 30 MG capsule Take 1 capsule (30 mg total) by mouth at bedtime.   . [DISCONTINUED] enalapril (VASOTEC) 20 MG tablet TAKE 1 TABLET BY MOUTH  DAILY    No facility-administered encounter medications on file as of 12/16/2016.     No Known Allergies  Review of Systems  Constitutional: Negative for fever and malaise/fatigue.  Eyes: Negative.   Respiratory: Negative for cough, shortness of breath and wheezing.   Cardiovascular: Negative for chest pain, palpitations and orthopnea.  Gastrointestinal: Negative.   Genitourinary: Negative for frequency.  Skin: Negative.   Neurological: Negative.  Negative for weakness.  Endo/Heme/Allergies: Negative for polydipsia.  Psychiatric/Behavioral: Negative.     Objective:  BP 118/80 (BP Location: Right Arm, Patient Position: Sitting, Cuff Size: Normal)   Pulse 80   Temp 97.7 F (36.5 C) (Oral)   Resp 16   Wt 184 lb (83.5 kg)   BMI 26.40 kg/m   Physical Exam  Constitutional: He is oriented to person, place, and time and well-developed, well-nourished, and in no distress.  HENT:  Head: Normocephalic and atraumatic.  Eyes: Conjunctivae are normal. Pupils are equal, round, and reactive to light.  Neck: Normal range of motion.  Cardiovascular: Normal rate, regular rhythm and normal heart sounds.  Pulmonary/Chest: Effort normal and breath sounds normal.  Abdominal: Soft.  Neurological: He is alert and oriented to person, place, and time. Gait normal. GCS score is 15.  Skin: Skin is warm and dry.  Psychiatric: Mood, memory, affect and judgment normal.    Assessment and Plan :  TIIDM A1C is 6.3 today. CAD All risk factors treated.  I have done the exam and reviewed the chart and it is accurate to the best of my knowledge. Development worker, community has been used and  any errors in dictation or transcription are unintentional. Miguel Aschoff M.D. [facility]

## 2016-12-26 ENCOUNTER — Ambulatory Visit: Payer: Self-pay | Admitting: Family Medicine

## 2017-01-12 ENCOUNTER — Other Ambulatory Visit: Payer: Self-pay | Admitting: Family Medicine

## 2017-01-12 DIAGNOSIS — M7130 Other bursal cyst, unspecified site: Secondary | ICD-10-CM

## 2017-02-10 ENCOUNTER — Telehealth: Payer: Self-pay | Admitting: Family Medicine

## 2017-02-10 NOTE — Telephone Encounter (Signed)
Ok to do so?-[name] [name] [employer], [place]

## 2017-02-10 NOTE — Telephone Encounter (Signed)
Temazepam is on back order from [facility].  Pt. Is out and wants it called into [facility] for 30 days please.

## 2017-02-10 NOTE — Telephone Encounter (Signed)
yes

## 2017-02-11 NOTE — Telephone Encounter (Signed)
rx called in-Danilo Cappiello V Denelle Capurro, RMA  

## 2017-03-19 DIAGNOSIS — L570 Actinic keratosis: Secondary | ICD-10-CM | POA: Diagnosis not present

## 2017-03-19 DIAGNOSIS — L578 Other skin changes due to chronic exposure to nonionizing radiation: Secondary | ICD-10-CM | POA: Diagnosis not present

## 2017-03-19 DIAGNOSIS — D485 Neoplasm of uncertain behavior of skin: Secondary | ICD-10-CM | POA: Diagnosis not present

## 2017-04-15 ENCOUNTER — Ambulatory Visit: Payer: 59 | Admitting: Family Medicine

## 2017-04-15 ENCOUNTER — Encounter: Payer: Self-pay | Admitting: Family Medicine

## 2017-04-15 VITALS — BP 122/84 | HR 88 | Temp 97.7°F | Resp 16

## 2017-04-15 DIAGNOSIS — E785 Hyperlipidemia, unspecified: Secondary | ICD-10-CM | POA: Diagnosis not present

## 2017-04-15 DIAGNOSIS — I10 Essential (primary) hypertension: Secondary | ICD-10-CM | POA: Diagnosis not present

## 2017-04-15 DIAGNOSIS — E119 Type 2 diabetes mellitus without complications: Secondary | ICD-10-CM

## 2017-04-15 LAB — POCT UA - MICROALBUMIN: MICROALBUMIN (UR) POC: 20 mg/L

## 2017-04-15 LAB — POCT GLYCOSYLATED HEMOGLOBIN (HGB A1C): Hemoglobin A1C: 6.4

## 2017-04-15 NOTE — Progress Notes (Signed)
Patient: [name] [name] Male    DOB: 1955/11/07   62 y.o.   MRN: [id number] Visit Date: 04/15/2017  Today's Provider: [name], MD   Chief Complaint  Patient presents with  . Diabetes   Subjective:    HPI  Diabetes Mellitus Type II, Follow-up:   Lab Results  Component Value Date   HGBA1C 6.4 04/15/2017   HGBA1C 6.3 12/16/2016   HGBA1C 6.5 08/21/2016    Last seen for diabetes 4 months ago.  Management since then includes none. He reports good compliance with treatment. He is not having side effects.  Home blood sugar records: 110-120  Episodes of hypoglycemia? no   Current Insulin Regimen: n/a Most Recent Eye Exam: last 2017, will make an appt.  Current exercise: yard work and active job  Pertinent Labs:    Component Value Date/Time   CHOL 106 04/17/2016 1038   TRIG 111 04/17/2016 1038   HDL 45 04/17/2016 1038   LDLCALC 39 04/17/2016 1038   CREATININE 1.06 05/15/2016 1608    Wt Readings from Last 3 Encounters:  12/16/16 184 lb (83.5 kg)  08/21/16 192 lb (87.1 kg)  04/17/16 189 lb (85.7 kg)    ------------------------------------------------------------------------     No Known Allergies   Current Outpatient Medications:  .  aspirin 81 MG tablet, Take by mouth., Disp: , Rfl:  .  atorvastatin (LIPITOR) 20 MG tablet, Take 1 tablet (20 mg total) daily at 6 PM by mouth., Disp: 90 tablet, Rfl: 3 .  cholecalciferol (VITAMIN D) 1000 UNITS tablet, Take by mouth., Disp: , Rfl:  .  Coenzyme Q10 100 MG TABS, Take by mouth., Disp: , Rfl:  .  ezetimibe (ZETIA) 10 MG tablet, Take 1 tablet (10 mg total) daily by mouth., Disp: 90 tablet, Rfl: 3 .  glucose blood test strip, Check sugar once daily. DX E11.9, Disp: 150 each, Rfl: 3 .  lansoprazole (PREVACID 24HR) 15 MG capsule, Take by mouth., Disp: , Rfl:  .  levothyroxine (SYNTHROID, LEVOTHROID) 75 MCG tablet, Take 1 tablet (75 mcg total) daily before breakfast by mouth., Disp: 90 tablet, Rfl: 3 .   meloxicam (MOBIC) 15 MG tablet, TAKE 1 TABLET (15 MG TOTAL) DAILY BY MOUTH., Disp: 30 tablet, Rfl: 5 .  metFORMIN (GLUCOPHAGE) 1000 MG tablet, Take 1 tablet (1,000 mg total) 2 (two) times daily with a meal by mouth., Disp: 180 tablet, Rfl: 3 .  metoprolol succinate (TOPROL-XL) 100 MG 24 hr tablet, Take 1 tablet (100 mg total) daily by mouth. Take with or immediately following a meal., Disp: 90 tablet, Rfl: 3 .  montelukast (SINGULAIR) 10 MG tablet, Take 1 tablet (10 mg total) by mouth at bedtime., Disp: 90 tablet, Rfl: 3 .  nitroGLYCERIN (NITROSTAT) 0.4 MG SL tablet, Place 1 tablet (0.4 mg total) under the tongue every 5 (five) minutes as needed for chest pain., Disp: 100 tablet, Rfl: 5 .  Saw Palmetto 160 MG TABS, Take by mouth., Disp: , Rfl:  .  sertraline (ZOLOFT) 100 MG tablet, Take 1 tablet (100 mg total) daily by mouth., Disp: 90 tablet, Rfl: 3 .  temazepam (RESTORIL) 30 MG capsule, Take 1 capsule (30 mg total) at bedtime by mouth., Disp: 90 capsule, Rfl: 1 .  fluticasone (FLONASE) 50 MCG/ACT nasal spray, Place into the nose. Reported on 08/14/2015, Disp: , Rfl:  .  ondansetron (ZOFRAN ODT) 8 MG disintegrating tablet, Take by mouth. Reported on 04/25/2015, Disp: , Rfl:  .  pyridOXINE (  VITAMIN B-6) 100 MG tablet, Take 100 mg by mouth daily., Disp: , Rfl:   Review of Systems  Constitutional: Negative.   HENT: Positive for congestion and postnasal drip.   Eyes: Negative.   Respiratory: Negative.   Cardiovascular: Negative.   Gastrointestinal: Negative.   Endocrine: Negative.   Genitourinary: Negative.   Skin: Negative.   Allergic/Immunologic: Negative.   Neurological: Negative.   Hematological: Negative.   Psychiatric/Behavioral: Negative.     Social History   Tobacco Use  . Smoking status: Never Smoker  . Smokeless tobacco: Never Used  Substance Use Topics  . Alcohol use: Yes    Comment: 2-3 beer on the weekends   Objective:   BP 122/84 (BP Location: Left Arm, Patient  Position: Sitting, Cuff Size: Normal)   Pulse 88   Temp 97.7 F (36.5 C) (Oral)   Resp 16   SpO2 98%  Vitals:   04/15/17 0819  BP: 122/84  Pulse: 88  Resp: 16  Temp: 97.7 F (36.5 C)  TempSrc: Oral  SpO2: 98%     Physical Exam  Constitutional: He is oriented to person, place, and time. He appears well-developed and well-nourished.  Eyes: Conjunctivae and EOM are normal. Pupils are equal, round, and reactive to light.  Neck: Normal range of motion. Neck supple.  Cardiovascular: Normal rate, regular rhythm, normal heart sounds and intact distal pulses.  Pulmonary/Chest: Effort normal and breath sounds normal.  Musculoskeletal: Normal range of motion.  Neurological: He is alert and oriented to person, place, and time. He has normal reflexes.  Skin: Skin is warm and dry.  Psychiatric: He has a normal mood and affect. His behavior is normal. Judgment and thought content normal.        Assessment & Plan:     1. Well controlled diabetes mellitus ([place]) Can not take an ACE or ARB due to angioedema  - POCT HgB A1C 6.4  - POCT UA - Microalbumin--20  2. Essential (primary) hypertension Stable.  - CBC with Differential/Platelet - TSH  3. Hyperlipidemia, unspecified hyperlipidemia type  - Lipid panel - Comprehensive metabolic panel 4.CAD All risk factors treated.     HPI, Exam, and A&P Transcribed under the direction and in the presence of Richard L. Cranford Mon, MD  Electronically Signed: Katina Dung, CMA  I have done the exam and reviewed the above chart and it is accurate to the best of my knowledge. Development worker, community has been used in this note in any air is in the dictation or transcription are unintentional.  [name], MD  [place]

## 2017-04-18 DIAGNOSIS — E785 Hyperlipidemia, unspecified: Secondary | ICD-10-CM | POA: Diagnosis not present

## 2017-04-18 DIAGNOSIS — I10 Essential (primary) hypertension: Secondary | ICD-10-CM | POA: Diagnosis not present

## 2017-04-19 LAB — COMPREHENSIVE METABOLIC PANEL
A/G RATIO: 1.6 (ref 1.2–2.2)
ALBUMIN: 4.6 g/dL (ref 3.6–4.8)
ALT: 39 IU/L (ref 0–44)
AST: 30 IU/L (ref 0–40)
Alkaline Phosphatase: 81 IU/L (ref 39–117)
BILIRUBIN TOTAL: 0.5 mg/dL (ref 0.0–1.2)
BUN / CREAT RATIO: 15 (ref 10–24)
BUN: 15 mg/dL (ref 8–27)
CHLORIDE: 104 mmol/L (ref 96–106)
CO2: 22 mmol/L (ref 20–29)
Calcium: 9.9 mg/dL (ref 8.6–10.2)
Creatinine, Ser: 0.97 mg/dL (ref 0.76–1.27)
GFR calc Af Amer: 97 mL/min/{1.73_m2} (ref >59)
GFR calc non Af Amer: 84 mL/min/{1.73_m2} (ref >59)
Globulin, Total: 2.8 g/dL (ref 1.5–4.5)
Glucose: 131 mg/dL — ABNORMAL HIGH (ref 65–99)
POTASSIUM: 5 mmol/L (ref 3.5–5.2)
Sodium: 141 mmol/L (ref 134–144)
Total Protein: 7.4 g/dL (ref 6.0–8.5)

## 2017-04-19 LAB — CBC WITH DIFFERENTIAL/PLATELET
BASOS ABS: 0.1 10*3/uL (ref 0.0–0.2)
Basos: 1 %
EOS (ABSOLUTE): 0.2 10*3/uL (ref 0.0–0.4)
Eos: 3 %
Hematocrit: 43.3 % (ref 37.5–51.0)
Hemoglobin: 14.1 g/dL (ref 13.0–17.7)
Immature Grans (Abs): 0 10*3/uL (ref 0.0–0.1)
Immature Granulocytes: 0 %
LYMPHS ABS: 2 10*3/uL (ref 0.7–3.1)
LYMPHS: 37 %
MCH: 29.6 pg (ref 26.6–33.0)
MCHC: 32.6 g/dL (ref 31.5–35.7)
MCV: 91 fL (ref 79–97)
Monocytes Absolute: 0.4 10*3/uL (ref 0.1–0.9)
Monocytes: 8 %
NEUTROS ABS: 2.7 10*3/uL (ref 1.4–7.0)
Neutrophils: 51 %
PLATELETS: 210 10*3/uL (ref 150–379)
RBC: 4.77 x10E6/uL (ref 4.14–5.80)
RDW: 14.7 % (ref 12.3–15.4)
WBC: 5.4 10*3/uL (ref 3.4–10.8)

## 2017-04-19 LAB — LIPID PANEL
CHOLESTEROL TOTAL: 125 mg/dL (ref 100–199)
Chol/HDL Ratio: 2.4 ratio (ref 0.0–5.0)
HDL: 52 mg/dL (ref >39)
LDL Calculated: 40 mg/dL (ref 0–99)
TRIGLYCERIDES: 167 mg/dL — AB (ref 0–149)
VLDL CHOLESTEROL CAL: 33 mg/dL (ref 5–40)

## 2017-04-19 LAB — TSH: TSH: 3.14 u[IU]/mL (ref 0.450–4.500)

## 2017-08-14 ENCOUNTER — Other Ambulatory Visit: Payer: Self-pay | Admitting: Family Medicine

## 2017-08-14 DIAGNOSIS — G4709 Other insomnia: Secondary | ICD-10-CM

## 2017-08-14 NOTE — Telephone Encounter (Signed)
Pt needs a month supply of   Temazepam 30 mg   [facility]

## 2017-08-18 MED ORDER — TEMAZEPAM 30 MG PO CAPS
30.0000 mg | ORAL_CAPSULE | Freq: Every day | ORAL | 1 refills | Status: DC
Start: 2017-08-18 — End: 2017-11-18

## 2017-08-19 ENCOUNTER — Ambulatory Visit (INDEPENDENT_AMBULATORY_CARE_PROVIDER_SITE_OTHER): Payer: 59 | Admitting: Family Medicine

## 2017-08-19 VITALS — BP 135/89 | HR 78 | Temp 97.6°F | Resp 16 | Ht 70.0 in | Wt 182.0 lb

## 2017-08-19 DIAGNOSIS — Z125 Encounter for screening for malignant neoplasm of prostate: Secondary | ICD-10-CM | POA: Diagnosis not present

## 2017-08-19 DIAGNOSIS — E785 Hyperlipidemia, unspecified: Secondary | ICD-10-CM | POA: Diagnosis not present

## 2017-08-19 DIAGNOSIS — Z0000 Encounter for general adult medical examination without abnormal findings: Secondary | ICD-10-CM

## 2017-08-19 DIAGNOSIS — E119 Type 2 diabetes mellitus without complications: Secondary | ICD-10-CM | POA: Diagnosis not present

## 2017-08-19 MED ORDER — ATORVASTATIN CALCIUM 20 MG PO TABS: 20.0000 mg | ORAL_TABLET | Freq: Every day | ORAL | 3 refills | Status: DC

## 2017-08-19 NOTE — Progress Notes (Signed)
Patient: [name] [name], Male    DOB: 1955/11/19, 62 y.o.   MRN: [id number] Visit Date: 08/19/2017  Today's Provider: [name], MD   Chief Complaint  Patient presents with  . Annual Exam   Subjective:  Edoardo Laforte is a 62 y.o. male who presents today for health maintenance and complete physical. He feels well. He reports exercising daily. He reports he is sleeping well.  11/29/2013 Colonoscopy, [facility]-no path report in chart  Immunization History  Administered Date(s) Administered  . Influenza,inj,Quad PF,6+ Mos 12/25/2015, 12/16/2016  . Pneumococcal Conjugate-13 01/05/2013  . Tdap 02/09/2008    Review of Systems  Constitutional: Negative.   HENT: Positive for hearing loss.   Eyes: Positive for redness.  Respiratory: Negative.   Cardiovascular: Negative.   Gastrointestinal: Negative.   Endocrine: Negative.   Genitourinary: Negative.   Musculoskeletal: Negative.   Skin: Negative.   Allergic/Immunologic: Negative.   Neurological: Negative.   Hematological: Negative.   Psychiatric/Behavioral: Positive for sleep disturbance.    Social History   Socioeconomic History  . Marital status: Married    Spouse name: Not on file  . Number of children: Not on file  . Years of education: Not on file  . Highest education level: Not on file  Occupational History  . Not on file  Social Needs  . Financial resource strain: Not on file  . Food insecurity:    Worry: Not on file    Inability: Not on file  . Transportation needs:    Medical: Not on file    Non-medical: Not on file  Tobacco Use  . Smoking status: Never Smoker  . Smokeless tobacco: Never Used  Substance and Sexual Activity  . Alcohol use: Yes    Comment: 2-3 beer on the weekends  . Drug use: No  . Sexual activity: Yes  Lifestyle  . Physical activity:    Days per week: Not on file    Minutes per session: Not on file  . Stress: Not on file  Relationships  . Social connections:    Talks on phone:  Not on file    Gets together: Not on file    Attends religious service: Not on file    Active member of club or organization: Not on file    Attends meetings of clubs or organizations: Not on file    Relationship status: Not on file  . Intimate partner violence:    Fear of current or ex partner: Not on file    Emotionally abused: Not on file    Physically abused: Not on file    Forced sexual activity: Not on file  Other Topics Concern  . Not on file  Social History Narrative  . Not on file    Patient Active Problem List   Diagnosis Date Noted  . Coronary artery disease involving native coronary artery of native heart without angina pectoris 12/07/2014  . Well controlled diabetes mellitus ([place]) 12/07/2014  . Major depressive disorder, single episode, mild ([place]) 12/07/2014  . Essential (primary) hypertension 12/07/2014  . HLD (hyperlipidemia) 12/07/2014  . Adult hypothyroidism 12/07/2014  . Cannot sleep 12/07/2014  . Overweight 12/07/2014  . Raynaud's syndrome 12/07/2014  . Avitaminosis D 12/07/2014  . Acute myocardial infarction [facility]) 05/31/2006    Past Surgical History:  Procedure Laterality Date  . CARDIAC SURGERY  05/30/05   stent placement    His family history includes Diabetes in his mother; Glaucoma in his mother; Gout in his mother; Heart disease  in his father and mother; Hypertension in his father, mother, sister, and sister; Prostate cancer in his father; Skin cancer in his mother; Ulcers in his mother.     Outpatient Encounter Medications as of 08/19/2017  Medication Sig Note  . aspirin 81 MG tablet Take by mouth. 12/07/2014: Received from: [facility]  . atorvastatin (LIPITOR) 20 MG tablet Take 1 tablet (20 mg total) by mouth daily at 6 PM.   . cholecalciferol (VITAMIN D) 1000 UNITS tablet Take by mouth. 12/07/2014: Received from: [facility]  . Coenzyme Q10 100 MG TABS Take by mouth. 12/07/2014: Received from: [facility]  . ezetimibe (ZETIA) 10 MG tablet Take 1 tablet (10 mg total) daily by mouth.   . fluticasone (FLONASE) 50 MCG/ACT nasal spray Place into the nose. Reported on 08/14/2015 12/07/2014: Received from: [facility]  . glucose blood test strip Check sugar once daily. DX E11.9   . lansoprazole (PREVACID 24HR) 15 MG capsule Take by mouth. 12/07/2014: Received from: [facility]  . levothyroxine (SYNTHROID, LEVOTHROID) 75 MCG tablet Take 1 tablet (75 mcg total) daily before breakfast by mouth.   . meloxicam (MOBIC) 15 MG tablet TAKE 1 TABLET (15 MG TOTAL) DAILY BY MOUTH.   . metFORMIN (GLUCOPHAGE) 1000 MG tablet Take 1 tablet (1,000 mg total) 2 (two) times daily with a meal by mouth.   . metoprolol succinate (TOPROL-XL) 100 MG 24 hr tablet Take 1 tablet (100 mg total) daily by mouth. Take with or immediately following a meal.   . montelukast (SINGULAIR) 10 MG tablet Take 1 tablet (10 mg total) by mouth at bedtime.   . nitroGLYCERIN (NITROSTAT) 0.4 MG SL tablet Place 1 tablet (0.4 mg total) under the tongue every 5 (five) minutes as needed for chest pain.   Marland Kitchen ondansetron (ZOFRAN ODT) 8 MG disintegrating tablet Take by mouth. Reported on 04/25/2015 12/07/2014: Received from: [facility]  . pyridOXINE (VITAMIN B-6) 100 MG tablet Take 100 mg by mouth daily.   . Saw Palmetto 160 MG TABS Take by mouth. 12/07/2014: Received from: [facility]  . sertraline (ZOLOFT) 100 MG tablet Take 1 tablet (100 mg total) daily by mouth.   . temazepam (RESTORIL) 30 MG capsule Take 1 capsule (30 mg total) by mouth at bedtime.   . [DISCONTINUED] atorvastatin (LIPITOR) 20 MG tablet Take 1 tablet (20 mg total) daily at 6 PM by mouth.    No facility-administered encounter medications on file as of 08/19/2017.    The patient does not have a history of falls. I did not complete a risk assessment for falls. A plan of care for falls was not  documented.  Home Exercise  08/19/2017 04/17/2016 04/25/2015  Current Exercise Habits Home exercise routine Home exercise routine Home exercise routine  Type of exercise - (No Data) walking;strength training/weights  Time (Minutes) - 60 60  Frequency (Times/Week) 7 3 7   Weekly Exercise (Minutes/Week) - 180 420  Intensity - Moderate Intense  Exercise limited by: - None identified None identified    Functional Status Survey: Is the patient deaf or have difficulty hearing?: Yes Does the patient have difficulty seeing, even when wearing glasses/contacts?: No Does the patient have difficulty concentrating, remembering, or making decisions?: Yes Does the patient have difficulty walking or climbing stairs?: No Does the patient have difficulty dressing or bathing?: No Does the patient have difficulty doing errands alone such as visiting a doctor's office or shopping?: No Depression screen [facility] 2/9 08/19/2017  04/15/2017 04/17/2016 04/25/2015 12/13/2014  Decreased Interest 0 0 0 0 0  Down, Depressed, Hopeless 0 0 0 0 0  PHQ - 2 Score 0 0 0 0 0  Altered sleeping 1 1 0 - -  Tired, decreased energy 1 0 1 - -  Change in appetite 0 0 0 - -  Feeling bad or failure about yourself  0 0 0 - -  Trouble concentrating 0 0 0 - -  Moving slowly or fidgety/restless 0 0 0 - -  Suicidal thoughts 0 0 0 - -  PHQ-9 Score 2 1 1  - -  Difficult doing work/chores Not difficult at all Not difficult at all - - -     Office Visit from 08/19/2017 in [place]  AUDIT-C Score  3      Patient Care Team: Jerrol Banana., MD as PCP - General (Family Medicine)      Objective:   Vitals:  Vitals:   08/19/17 0824  BP: 135/89  Pulse: 78  Resp: 16  Temp: 97.6 F (36.4 C)  TempSrc: Oral  Weight: 182 lb (82.6 kg)  Height: 5\' 10"  (1.778 m)    Physical Exam  Constitutional: He is oriented to person, place, and time. He appears well-developed and well-nourished.  WNWDWM appears younger than his  age.  HENT:  Head: Normocephalic and atraumatic.  Right Ear: External ear normal.  Left Ear: External ear normal.  Nose: Nose normal.  Mouth/Throat: Oropharynx is clear and moist.  Eyes: Pupils are equal, round, and reactive to light. Conjunctivae and EOM are normal.  Neck: Normal range of motion. Neck supple.  Cardiovascular: Normal rate, regular rhythm, normal heart sounds and intact distal pulses.  Pulmonary/Chest: Effort normal and breath sounds normal.  Abdominal: Soft. Bowel sounds are normal.  Genitourinary: Rectum normal, prostate normal and penis normal.  Musculoskeletal: Normal range of motion.  Neurological: He is alert and oriented to person, place, and time.  Skin: Skin is warm and dry.  Psychiatric: He has a normal mood and affect. His behavior is normal. Judgment and thought content normal.     Depression Screen PHQ 2/9 Scores 08/19/2017 04/15/2017 04/17/2016 04/25/2015  PHQ - 2 Score 0 0 0 0  PHQ- 9 Score 2 1 1  -      Assessment & Plan:     Routine Health Maintenance and Physical Exam  Exercise Activities and Dietary recommendations Goals    None      Immunization History  Administered Date(s) Administered  . Influenza,inj,Quad PF,6+ Mos 12/25/2015, 12/16/2016  . Pneumococcal Conjugate-13 01/05/2013  . Tdap 02/09/2008    Health Maintenance  Topic Date Due  . Hepatitis C Screening  1955/09/10  . PNEUMOCOCCAL POLYSACCHARIDE VACCINE (1) 01/05/1958  . OPHTHALMOLOGY EXAM  02/27/2017  . FOOT EXAM  08/21/2017  . INFLUENZA VACCINE  08/28/2017  . HEMOGLOBIN A1C  10/16/2017  . TETANUS/TDAP  02/08/2018  . URINE MICROALBUMIN  04/16/2018  . COLONOSCOPY  11/30/2023  . HIV Screening  Completed     Discussed health benefits of physical activity, and encouraged him to engage in regular exercise appropriate for his age and condition.  CAD All risk factors treated.    I have done the exam and reviewed the chart and it is accurate to the best of my knowledge.  Development worker, community has been used and  any errors in dictation or transcription are unintentional. Miguel Aschoff M.D. [facility]

## 2017-08-20 LAB — LIPID PANEL WITH LDL/HDL RATIO
CHOLESTEROL TOTAL: 116 mg/dL (ref 100–199)
HDL: 45 mg/dL (ref >39)
LDL Calculated: 44 mg/dL (ref 0–99)
LDl/HDL Ratio: 1 ratio (ref 0.0–3.6)
TRIGLYCERIDES: 135 mg/dL (ref 0–149)
VLDL Cholesterol Cal: 27 mg/dL (ref 5–40)

## 2017-08-20 LAB — PSA: PROSTATE SPECIFIC AG, SERUM: 1.1 ng/mL (ref 0.0–4.0)

## 2017-08-20 LAB — CBC WITH DIFFERENTIAL/PLATELET
BASOS ABS: 0.1 10*3/uL (ref 0.0–0.2)
Basos: 1 %
EOS (ABSOLUTE): 0.2 10*3/uL (ref 0.0–0.4)
Eos: 3 %
Hematocrit: 40.9 % (ref 37.5–51.0)
Hemoglobin: 13.2 g/dL (ref 13.0–17.7)
IMMATURE GRANULOCYTES: 0 %
Immature Grans (Abs): 0 10*3/uL (ref 0.0–0.1)
Lymphocytes Absolute: 2.1 10*3/uL (ref 0.7–3.1)
Lymphs: 35 %
MCH: 29.5 pg (ref 26.6–33.0)
MCHC: 32.3 g/dL (ref 31.5–35.7)
MCV: 92 fL (ref 79–97)
MONOS ABS: 0.4 10*3/uL (ref 0.1–0.9)
Monocytes: 6 %
NEUTROS PCT: 55 %
Neutrophils Absolute: 3.2 10*3/uL (ref 1.4–7.0)
PLATELETS: 216 10*3/uL (ref 150–450)
RBC: 4.47 x10E6/uL (ref 4.14–5.80)
RDW: 14.4 % (ref 12.3–15.4)
WBC: 5.8 10*3/uL (ref 3.4–10.8)

## 2017-08-20 LAB — COMPREHENSIVE METABOLIC PANEL
A/G RATIO: 1.9 (ref 1.2–2.2)
ALT: 32 IU/L (ref 0–44)
AST: 28 IU/L (ref 0–40)
Albumin: 4.5 g/dL (ref 3.6–4.8)
Alkaline Phosphatase: 72 IU/L (ref 39–117)
BUN/Creatinine Ratio: 20 (ref 10–24)
BUN: 20 mg/dL (ref 8–27)
Bilirubin Total: 0.6 mg/dL (ref 0.0–1.2)
CHLORIDE: 105 mmol/L (ref 96–106)
CO2: 24 mmol/L (ref 20–29)
Calcium: 9.7 mg/dL (ref 8.6–10.2)
Creatinine, Ser: 1 mg/dL (ref 0.76–1.27)
GFR calc Af Amer: 93 mL/min/{1.73_m2} (ref >59)
GFR, EST NON AFRICAN AMERICAN: 81 mL/min/{1.73_m2} (ref >59)
Globulin, Total: 2.4 g/dL (ref 1.5–4.5)
Glucose: 115 mg/dL — ABNORMAL HIGH (ref 65–99)
POTASSIUM: 4.9 mmol/L (ref 3.5–5.2)
Sodium: 144 mmol/L (ref 134–144)
Total Protein: 6.9 g/dL (ref 6.0–8.5)

## 2017-08-20 LAB — HEMOGLOBIN A1C
ESTIMATED AVERAGE GLUCOSE: 137 mg/dL
HEMOGLOBIN A1C: 6.4 % — AB (ref 4.8–5.6)

## 2017-08-20 LAB — TSH: TSH: 3.87 u[IU]/mL (ref 0.450–4.500)

## 2017-08-21 NOTE — Progress Notes (Signed)
Advised  ED 

## 2017-10-02 ENCOUNTER — Ambulatory Visit: Payer: 59 | Admitting: Family Medicine

## 2017-10-02 VITALS — BP 136/94 | HR 90 | Temp 97.9°F | Resp 16 | Wt 186.0 lb

## 2017-10-02 DIAGNOSIS — R000 Tachycardia, unspecified: Secondary | ICD-10-CM

## 2017-10-02 DIAGNOSIS — I10 Essential (primary) hypertension: Secondary | ICD-10-CM | POA: Diagnosis not present

## 2017-10-02 DIAGNOSIS — E119 Type 2 diabetes mellitus without complications: Secondary | ICD-10-CM | POA: Diagnosis not present

## 2017-10-02 DIAGNOSIS — I2510 Atherosclerotic heart disease of native coronary artery without angina pectoris: Secondary | ICD-10-CM

## 2017-10-02 DIAGNOSIS — E785 Hyperlipidemia, unspecified: Secondary | ICD-10-CM

## 2017-10-02 MED ORDER — DILTIAZEM HCL ER 120 MG PO CP24: 120.0000 mg | ORAL_CAPSULE | Freq: Every day | ORAL | 5 refills | Status: DC

## 2017-10-02 NOTE — Progress Notes (Signed)
[name] [name]  MRN: [id number] DOB: [birth year]  Subjective:  HPI  Patient is a 62 year old male who presents for evaluation of elevated blood pressure and heart rate.  He reports his blood pressure has been as high as 150/104.  The [atinet states his heart rate at one time was 176.  He reports being more tired than normal and wears out easier than normal.   Patient Active Problem List   Diagnosis Date Noted  . Coronary artery disease involving native coronary artery of native heart without angina pectoris 12/07/2014  . Well controlled diabetes mellitus ([place]) 12/07/2014  . Major depressive disorder, single episode, mild ([place]) 12/07/2014  . Essential (primary) hypertension 12/07/2014  . HLD (hyperlipidemia) 12/07/2014  . Adult hypothyroidism 12/07/2014  . Cannot sleep 12/07/2014  . Overweight 12/07/2014  . Raynaud's syndrome 12/07/2014  . Avitaminosis D 12/07/2014  . Acute myocardial infarction ([place]) 05/31/2006    No past medical history on file.  Social History   Socioeconomic History  . Marital status: Married    Spouse name: Not on file  . Number of children: Not on file  . Years of education: Not on file  . Highest education level: Not on file  Occupational History  . Not on file  Social Needs  . Financial resource strain: Not on file  . Food insecurity:    Worry: Not on file    Inability: Not on file  . Transportation needs:    Medical: Not on file    Non-medical: Not on file  Tobacco Use  . Smoking status: Never Smoker  . Smokeless tobacco: Never Used  Substance and Sexual Activity  . Alcohol use: Yes    Comment: 2-3 beer on the weekends  . Drug use: No  . Sexual activity: Yes  Lifestyle  . Physical activity:    Days per week: Not on file    Minutes per session: Not on file  . Stress: Not on file  Relationships  . Social connections:    Talks on phone: Not on file    Gets together: Not on file    Attends religious service: Not on file    Active  member of club or organization: Not on file    Attends meetings of clubs or organizations: Not on file    Relationship status: Not on file  . Intimate partner violence:    Fear of current or ex partner: Not on file    Emotionally abused: Not on file    Physically abused: Not on file    Forced sexual activity: Not on file  Other Topics Concern  . Not on file  Social History Narrative  . Not on file    Outpatient Encounter Medications as of 10/02/2017  Medication Sig Note  . aspirin 81 MG tablet Take by mouth. 12/07/2014: Received from: [facility]  . atorvastatin (LIPITOR) 20 MG tablet Take 1 tablet (20 mg total) by mouth daily at 6 PM.   . cholecalciferol (VITAMIN D) 1000 UNITS tablet Take by mouth. 12/07/2014: Received from: [facility]  . Coenzyme Q10 100 MG TABS Take by mouth. 12/07/2014: Received from: [facility]  . ezetimibe (ZETIA) 10 MG tablet Take 1 tablet (10 mg total) daily by mouth.   . fluticasone (FLONASE) 50 MCG/ACT nasal spray Place into the nose. Reported on 08/14/2015 12/07/2014: Received from: [facility]  . glucose blood test strip Check sugar once daily. DX E11.9   . lansoprazole (PREVACID  24HR) 15 MG capsule Take by mouth. 12/07/2014: Received from: [facility]  . levothyroxine (SYNTHROID, LEVOTHROID) 75 MCG tablet Take 1 tablet (75 mcg total) daily before breakfast by mouth.   . meloxicam (MOBIC) 15 MG tablet TAKE 1 TABLET (15 MG TOTAL) DAILY BY MOUTH.   . metFORMIN (GLUCOPHAGE) 1000 MG tablet Take 1 tablet (1,000 mg total) 2 (two) times daily with a meal by mouth.   . metoprolol succinate (TOPROL-XL) 100 MG 24 hr tablet Take 1 tablet (100 mg total) daily by mouth. Take with or immediately following a meal.   . montelukast (SINGULAIR) 10 MG tablet Take 1 tablet (10 mg total) by mouth at bedtime.   . nitroGLYCERIN (NITROSTAT) 0.4 MG SL tablet Place 1 tablet (0.4 mg total) under the  tongue every 5 (five) minutes as needed for chest pain.   Marland Kitchen ondansetron (ZOFRAN ODT) 8 MG disintegrating tablet Take by mouth. Reported on 04/25/2015 12/07/2014: Received from: [facility]  . pyridOXINE (VITAMIN B-6) 100 MG tablet Take 100 mg by mouth daily.   . Saw Palmetto 160 MG TABS Take by mouth. 12/07/2014: Received from: [facility]  . sertraline (ZOLOFT) 100 MG tablet Take 1 tablet (100 mg total) daily by mouth.   . temazepam (RESTORIL) 30 MG capsule Take 1 capsule (30 mg total) by mouth at bedtime.    No facility-administered encounter medications on file as of 10/02/2017.     Allergies  Allergen Reactions  . Enalapril Swelling    Lip swelling    Review of Systems  Constitutional: Negative.   HENT: Negative.   Eyes: Negative.   Respiratory: Positive for cough (secondary to mowing hay). Negative for shortness of breath.   Cardiovascular: Negative for chest pain, palpitations, orthopnea, claudication and leg swelling.  Gastrointestinal: Negative.   Skin: Negative.   Neurological: Positive for headaches. Negative for dizziness and tingling.  Endo/Heme/Allergies: Negative.   Psychiatric/Behavioral: Negative.     Objective:  BP (!) 136/94 (BP Location: Right Arm, Patient Position: Sitting, Cuff Size: Normal)   Pulse 90   Temp 97.9 F (36.6 C) (Oral)   Resp 16   Wt 186 lb (84.4 kg)   SpO2 98%   BMI 26.69 kg/m   Physical Exam  Constitutional: He is oriented to person, place, and time and well-developed, well-nourished, and in no distress.  HENT:  Head: Normocephalic and atraumatic.  Right Ear: External ear normal.  Left Ear: External ear normal.  Nose: Nose normal.  Eyes: Conjunctivae are normal. No scleral icterus.  Neck: No thyromegaly present.  Cardiovascular: Normal rate, regular rhythm and normal heart sounds.  Pulmonary/Chest: Effort normal and breath sounds normal.  Abdominal: Soft.  Musculoskeletal: He exhibits no edema.    Lymphadenopathy:    He has no cervical adenopathy.  Neurological: He is alert and oriented to person, place, and time. Gait normal. GCS score is 15.  Skin: Skin is warm and dry.  Psychiatric: Mood, memory, affect and judgment normal.    Assessment and Plan :  1. Tachycardia  - EKG 12-Lead - CBC with Differential/Platelet - Troponin I - Renal function panel - Pro b natriuretic peptide (BNP)  2. Essential (primary) hypertension Add low dose Cardizem to Toprol. - diltiazem (DILACOR XR) 120 MG 24 hr capsule; Take 1 capsule (120 mg total) by mouth daily.  Dispense: 30 capsule; Refill: 5 3.CAD Refer back to cardiology for evaluation. All risk factors treated. 4.HLD 5.TIIDM  I have done the exam and reviewed the chart  and it is accurate to the best of my knowledge. Development worker, community has been used and  any errors in dictation or transcription are unintentional. Miguel Aschoff M.D. [facility]

## 2017-10-03 DIAGNOSIS — R000 Tachycardia, unspecified: Secondary | ICD-10-CM | POA: Diagnosis not present

## 2017-10-03 DIAGNOSIS — E785 Hyperlipidemia, unspecified: Secondary | ICD-10-CM | POA: Diagnosis not present

## 2017-10-03 DIAGNOSIS — I2510 Atherosclerotic heart disease of native coronary artery without angina pectoris: Secondary | ICD-10-CM | POA: Diagnosis not present

## 2017-10-03 LAB — RENAL FUNCTION PANEL
ALBUMIN: 4.7 g/dL (ref 3.6–4.8)
BUN/Creatinine Ratio: 17 (ref 10–24)
BUN: 16 mg/dL (ref 8–27)
CO2: 21 mmol/L (ref 20–29)
CREATININE: 0.93 mg/dL (ref 0.76–1.27)
Calcium: 10.1 mg/dL (ref 8.6–10.2)
Chloride: 100 mmol/L (ref 96–106)
GFR, EST AFRICAN AMERICAN: 102 mL/min/{1.73_m2} (ref >59)
GFR, EST NON AFRICAN AMERICAN: 88 mL/min/{1.73_m2} (ref >59)
GLUCOSE: 121 mg/dL — AB (ref 65–99)
POTASSIUM: 4.9 mmol/L (ref 3.5–5.2)
Phosphorus: 3.3 mg/dL (ref 2.5–4.5)
Sodium: 136 mmol/L (ref 134–144)

## 2017-10-03 LAB — CBC WITH DIFFERENTIAL/PLATELET
BASOS ABS: 0.1 10*3/uL (ref 0.0–0.2)
Basos: 1 %
EOS (ABSOLUTE): 0.2 10*3/uL (ref 0.0–0.4)
EOS: 2 %
HEMATOCRIT: 43 % (ref 37.5–51.0)
Hemoglobin: 14.2 g/dL (ref 13.0–17.7)
Immature Grans (Abs): 0 10*3/uL (ref 0.0–0.1)
Immature Granulocytes: 0 %
LYMPHS ABS: 2 10*3/uL (ref 0.7–3.1)
Lymphs: 24 %
MCH: 29.5 pg (ref 26.6–33.0)
MCHC: 33 g/dL (ref 31.5–35.7)
MCV: 89 fL (ref 79–97)
MONOS ABS: 0.7 10*3/uL (ref 0.1–0.9)
Monocytes: 8 %
NEUTROS ABS: 5.4 10*3/uL (ref 1.4–7.0)
Neutrophils: 65 %
Platelets: 213 10*3/uL (ref 150–450)
RBC: 4.81 x10E6/uL (ref 4.14–5.80)
RDW: 14.2 % (ref 12.3–15.4)
WBC: 8.3 10*3/uL (ref 3.4–10.8)

## 2017-10-03 LAB — PRO B NATRIURETIC PEPTIDE: NT-Pro BNP: 42 pg/mL (ref 0–210)

## 2017-10-03 LAB — TROPONIN I: Troponin I: <0.01 ng/mL (ref 0.00–0.04)

## 2017-10-06 ENCOUNTER — Telehealth: Payer: Self-pay

## 2017-10-06 NOTE — Telephone Encounter (Signed)
-----   Message from Jerrol Banana., MD sent at 10/03/2017 11:54 AM EDT ----- Labs OK

## 2017-10-06 NOTE — Telephone Encounter (Signed)
Pt advised.   Thanks,   -Taesha Goodell  

## 2017-10-16 ENCOUNTER — Ambulatory Visit: Payer: 59 | Admitting: Family Medicine

## 2017-10-16 VITALS — BP 122/86 | HR 88 | Temp 97.7°F | Resp 16 | Wt 186.0 lb

## 2017-10-16 DIAGNOSIS — Z23 Encounter for immunization: Secondary | ICD-10-CM | POA: Diagnosis not present

## 2017-10-16 DIAGNOSIS — I2510 Atherosclerotic heart disease of native coronary artery without angina pectoris: Secondary | ICD-10-CM

## 2017-10-16 DIAGNOSIS — R002 Palpitations: Secondary | ICD-10-CM

## 2017-10-16 DIAGNOSIS — I10 Essential (primary) hypertension: Secondary | ICD-10-CM

## 2017-10-16 MED ORDER — DILTIAZEM HCL ER 240 MG PO CP24: 240.0000 mg | ORAL_CAPSULE | Freq: Every day | ORAL | 3 refills | Status: DC

## 2017-10-16 NOTE — Progress Notes (Signed)
[name] [name]  MRN: [id number] DOB: 28-Feb-1955  Subjective:  HPI   The patient is a 62 year old male who presents today for follow up of his blood pressure and tachycardia.  He was seen on 10/02/17.  At that time his blood pressure was 136/94 and Heart Rate was 90.  He was given a prescription for Diltiazem 120 mg to take along with his Metoprolol.  He was also advised that an order was placed for referral back to the cardiologist.  Labs were done and for CBC, BNP, Renal function and Troponin and all were normal. The patient states that he has been to see the cardiologist and they have him on a 14 day heart monitor.  He is supposed to take it off tomorrow and send it in for analysis.   The patient has been checking his blood pressure and heart rate at home.  He has been getting readings that range from 482-500 systolic and 37-04 diastolic.  His heart rate has ranged 79-172.  He states that when the blood pressure or heart rate is elevated he does not have any symptoms of chest pain or shortness of breath.  He also states that he does not notice any pattern associated with activity.  The patient would also like to get his flu shot today.   Patient Active Problem List   Diagnosis Date Noted  . Coronary artery disease involving native coronary artery of native heart without angina pectoris 12/07/2014  . Well controlled diabetes mellitus ([place]) 12/07/2014  . Major depressive disorder, single episode, mild ([place]) 12/07/2014  . Essential (primary) hypertension 12/07/2014  . HLD (hyperlipidemia) 12/07/2014  . Adult hypothyroidism 12/07/2014  . Cannot sleep 12/07/2014  . Overweight 12/07/2014  . Raynaud's syndrome 12/07/2014  . Avitaminosis D 12/07/2014  . Acute myocardial infarction ([place]) 05/31/2006    No past medical history on file.  Social History   Socioeconomic History  . Marital status: Married    Spouse name: Not on file  . Number of children: Not on file  . Years of education:  Not on file  . Highest education level: Not on file  Occupational History  . Not on file  Social Needs  . Financial resource strain: Not on file  . Food insecurity:    Worry: Not on file    Inability: Not on file  . Transportation needs:    Medical: Not on file    Non-medical: Not on file  Tobacco Use  . Smoking status: Never Smoker  . Smokeless tobacco: Never Used  Substance and Sexual Activity  . Alcohol use: Yes    Comment: 2-3 beer on the weekends  . Drug use: No  . Sexual activity: Yes  Lifestyle  . Physical activity:    Days per week: Not on file    Minutes per session: Not on file  . Stress: Not on file  Relationships  . Social connections:    Talks on phone: Not on file    Gets together: Not on file    Attends religious service: Not on file    Active member of club or organization: Not on file    Attends meetings of clubs or organizations: Not on file    Relationship status: Not on file  . Intimate partner violence:    Fear of current or ex partner: Not on file    Emotionally abused: Not on file    Physically abused: Not on file    Forced sexual activity:  Not on file  Other Topics Concern  . Not on file  Social History Narrative  . Not on file    Outpatient Encounter Medications as of 10/16/2017  Medication Sig Note  . aspirin 81 MG tablet Take by mouth. 12/07/2014: Received from: [facility]  . atorvastatin (LIPITOR) 20 MG tablet Take 1 tablet (20 mg total) by mouth daily at 6 PM.   . cholecalciferol (VITAMIN D) 1000 UNITS tablet Take by mouth. 12/07/2014: Received from: [facility]  . Coenzyme Q10 100 MG TABS Take by mouth. 12/07/2014: Received from: [facility]  . diltiazem (DILACOR XR) 120 MG 24 hr capsule Take 1 capsule (120 mg total) by mouth daily.   Marland Kitchen ezetimibe (ZETIA) 10 MG tablet Take 1 tablet (10 mg total) daily by mouth.   . fluticasone (FLONASE) 50 MCG/ACT nasal spray Place into the nose.  Reported on 08/14/2015 12/07/2014: Received from: [facility]  . glucose blood test strip Check sugar once daily. DX E11.9   . lansoprazole (PREVACID 24HR) 15 MG capsule Take by mouth. 12/07/2014: Received from: [facility]  . levothyroxine (SYNTHROID, LEVOTHROID) 75 MCG tablet Take 1 tablet (75 mcg total) daily before breakfast by mouth.   . meloxicam (MOBIC) 15 MG tablet TAKE 1 TABLET (15 MG TOTAL) DAILY BY MOUTH.   . metFORMIN (GLUCOPHAGE) 1000 MG tablet Take 1 tablet (1,000 mg total) 2 (two) times daily with a meal by mouth.   . metoprolol succinate (TOPROL-XL) 100 MG 24 hr tablet Take 1 tablet (100 mg total) daily by mouth. Take with or immediately following a meal.   . montelukast (SINGULAIR) 10 MG tablet Take 1 tablet (10 mg total) by mouth at bedtime.   . nitroGLYCERIN (NITROSTAT) 0.4 MG SL tablet Place 1 tablet (0.4 mg total) under the tongue every 5 (five) minutes as needed for chest pain.   Marland Kitchen ondansetron (ZOFRAN ODT) 8 MG disintegrating tablet Take by mouth. Reported on 04/25/2015 12/07/2014: Received from: [facility]  . pyridOXINE (VITAMIN B-6) 100 MG tablet Take 100 mg by mouth daily.   . Saw Palmetto 160 MG TABS Take by mouth. 12/07/2014: Received from: [facility]  . sertraline (ZOLOFT) 100 MG tablet Take 1 tablet (100 mg total) daily by mouth.   . temazepam (RESTORIL) 30 MG capsule Take 1 capsule (30 mg total) by mouth at bedtime.    No facility-administered encounter medications on file as of 10/16/2017.     Allergies  Allergen Reactions  . Enalapril Swelling    Lip swelling    Review of Systems  Constitutional: Negative for fever and malaise/fatigue.  Respiratory: Positive for cough (from hay dust). Negative for shortness of breath and wheezing.   Cardiovascular: Negative for chest pain, palpitations and leg swelling.  Gastrointestinal: Negative.   Neurological: Negative.   Endo/Heme/Allergies: Negative.    Psychiatric/Behavioral: Negative.     Objective:  BP 122/86 (BP Location: Right Arm, Patient Position: Sitting, Cuff Size: Normal)   Pulse 88   Temp 97.7 F (36.5 C) (Oral)   Resp 16   Wt 186 lb (84.4 kg)   BMI 26.69 kg/m   Physical Exam  Constitutional: He is oriented to person, place, and time and well-developed, well-nourished, and in no distress.  HENT:  Head: Normocephalic and atraumatic.  Eyes: Conjunctivae are normal. No scleral icterus.  Neck: No thyromegaly present.  Cardiovascular: Normal rate, regular rhythm and normal heart sounds.  Pulmonary/Chest: Effort normal and breath sounds normal.  Abdominal:  Soft.  Musculoskeletal: He exhibits no edema.  Lymphadenopathy:    He has no cervical adenopathy.  Neurological: He is alert and oriented to person, place, and time. Gait normal. GCS score is 15.  Skin: Skin is warm and dry.  Psychiatric: Mood, memory, affect and judgment normal.    Assessment and Plan :    1. Essential (primary) hypertension  - diltiazem (DILACOR XR) 240 MG 24 hr capsule; Take 1 capsule (240 mg total) by mouth daily.  Dispense: 90 capsule; Refill: 3  2. Need for influenza vaccination  - Flu Vaccine QUAD 6+ mos PF IM (Fluarix Quad PF)  3. Coronary artery disease involving native coronary artery of native heart without angina pectoris   4. Palpitations Increase Diltiazem to 240mg  daily. RTC 1 month.  I have done the exam and reviewed the chart and it is accurate to the best of my knowledge. Development worker, community has been used and  any errors in dictation or transcription are unintentional. Miguel Aschoff M.D. [facility]

## 2017-10-22 DIAGNOSIS — R000 Tachycardia, unspecified: Secondary | ICD-10-CM | POA: Diagnosis not present

## 2017-10-29 DIAGNOSIS — R000 Tachycardia, unspecified: Secondary | ICD-10-CM | POA: Diagnosis not present

## 2017-10-29 LAB — HM DIABETES EYE EXAM

## 2017-11-17 ENCOUNTER — Other Ambulatory Visit: Payer: Self-pay | Admitting: Family Medicine

## 2017-11-17 ENCOUNTER — Ambulatory Visit: Payer: Self-pay | Admitting: Family Medicine

## 2017-11-17 DIAGNOSIS — E119 Type 2 diabetes mellitus without complications: Secondary | ICD-10-CM

## 2017-11-18 ENCOUNTER — Ambulatory Visit (INDEPENDENT_AMBULATORY_CARE_PROVIDER_SITE_OTHER): Payer: 59 | Admitting: Family Medicine

## 2017-11-18 ENCOUNTER — Encounter: Payer: Self-pay | Admitting: Family Medicine

## 2017-11-18 VITALS — BP 122/86 | HR 84 | Temp 97.5°F | Resp 16 | Wt 189.0 lb

## 2017-11-18 DIAGNOSIS — I2510 Atherosclerotic heart disease of native coronary artery without angina pectoris: Secondary | ICD-10-CM

## 2017-11-18 DIAGNOSIS — E039 Hypothyroidism, unspecified: Secondary | ICD-10-CM | POA: Diagnosis not present

## 2017-11-18 DIAGNOSIS — G4709 Other insomnia: Secondary | ICD-10-CM

## 2017-11-18 DIAGNOSIS — F320 Major depressive disorder, single episode, mild: Secondary | ICD-10-CM | POA: Diagnosis not present

## 2017-11-18 DIAGNOSIS — I10 Essential (primary) hypertension: Secondary | ICD-10-CM

## 2017-11-18 DIAGNOSIS — I4891 Unspecified atrial fibrillation: Secondary | ICD-10-CM

## 2017-11-18 NOTE — Progress Notes (Signed)
Patient: [name] [name] Male    DOB: 1956-01-25   62 y.o.   MRN: [id number] Visit Date: 11/18/2017  Today's Provider: [name], MD   Chief Complaint  Patient presents with  . Hypertension    One month follow up   Subjective:    Hypertension  This is a chronic problem. The problem has been gradually improving (Pt reports his blood pressures at home are 130's/70's-80's) since onset. The problem is controlled. Associated symptoms include headaches (Slightly secondary to allergies. ). Pertinent negatives include no anxiety, blurred vision, chest pain, malaise/fatigue, neck pain, orthopnea, palpitations, peripheral edema, PND, shortness of breath or sweats. There are no associated agents to hypertension. The current treatment provides moderate improvement. There are no compliance problems.        Allergies  Allergen Reactions  . Enalapril Swelling    Lip swelling     Current Outpatient Medications:  .  aspirin 81 MG tablet, Take by mouth., Disp: , Rfl:  .  atorvastatin (LIPITOR) 20 MG tablet, Take 1 tablet (20 mg total) by mouth daily at 6 PM., Disp: 90 tablet, Rfl: 3 .  cholecalciferol (VITAMIN D) 1000 UNITS tablet, Take by mouth., Disp: , Rfl:  .  Coenzyme Q10 100 MG TABS, Take by mouth., Disp: , Rfl:  .  diltiazem (DILACOR XR) 240 MG 24 hr capsule, Take 1 capsule (240 mg total) by mouth daily., Disp: 90 capsule, Rfl: 3 .  ezetimibe (ZETIA) 10 MG tablet, Take 1 tablet (10 mg total) daily by mouth., Disp: 90 tablet, Rfl: 3 .  fluticasone (FLONASE) 50 MCG/ACT nasal spray, Place into the nose. Reported on 08/14/2015, Disp: , Rfl:  .  glucose blood test strip, Check sugar once daily. DX E11.9, Disp: 150 each, Rfl: 3 .  lansoprazole (PREVACID 24HR) 15 MG capsule, Take by mouth., Disp: , Rfl:  .  levothyroxine (SYNTHROID, LEVOTHROID) 75 MCG tablet, Take 1 tablet (75 mcg total) daily before breakfast by mouth., Disp: 90 tablet, Rfl: 3 .  meloxicam (MOBIC) 15 MG tablet,  TAKE 1 TABLET (15 MG TOTAL) DAILY BY MOUTH., Disp: 30 tablet, Rfl: 5 .  metFORMIN (GLUCOPHAGE) 1000 MG tablet, Take 1 tablet (1,000 mg total) 2 (two) times daily with a meal by mouth., Disp: 180 tablet, Rfl: 3 .  metoprolol succinate (TOPROL-XL) 100 MG 24 hr tablet, Take 1 tablet (100 mg total) daily by mouth. Take with or immediately following a meal., Disp: 90 tablet, Rfl: 3 .  montelukast (SINGULAIR) 10 MG tablet, Take 1 tablet (10 mg total) by mouth at bedtime., Disp: 90 tablet, Rfl: 3 .  nitroGLYCERIN (NITROSTAT) 0.4 MG SL tablet, Place 1 tablet (0.4 mg total) under the tongue every 5 (five) minutes as needed for chest pain., Disp: 100 tablet, Rfl: 5 .  ondansetron (ZOFRAN ODT) 8 MG disintegrating tablet, Take by mouth. Reported on 04/25/2015, Disp: , Rfl:  .  pyridOXINE (VITAMIN B-6) 100 MG tablet, Take 100 mg by mouth daily., Disp: , Rfl:  .  Saw Palmetto 160 MG TABS, Take by mouth., Disp: , Rfl:  .  sertraline (ZOLOFT) 100 MG tablet, Take 1 tablet (100 mg total) daily by mouth., Disp: 90 tablet, Rfl: 3 .  temazepam (RESTORIL) 30 MG capsule, Take 1 capsule (30 mg total) by mouth at bedtime., Disp: 90 capsule, Rfl: 1  Review of Systems  Constitutional: Negative.  Negative for malaise/fatigue.  Eyes: Negative.  Negative for blurred vision.  Respiratory: Negative.  Negative for  shortness of breath.   Cardiovascular: Negative.  Negative for chest pain, palpitations, orthopnea and PND.  Gastrointestinal: Negative.   Endocrine: Negative.   Musculoskeletal: Negative.  Negative for neck pain.  Allergic/Immunologic: Negative.   Neurological: Positive for headaches (Slightly secondary to allergies. ). Negative for dizziness and light-headedness.  Psychiatric/Behavioral: Negative.     Social History   Tobacco Use  . Smoking status: Never Smoker  . Smokeless tobacco: Never Used  Substance Use Topics  . Alcohol use: Yes    Comment: 2-3 beer on the weekends   Objective:   BP 122/86 (BP  Location: Left Arm, Patient Position: Sitting, Cuff Size: Normal)   Pulse 84   Temp (!) 97.5 F (36.4 C) (Oral)   Resp 16   Wt 189 lb (85.7 kg)   BMI 27.12 kg/m  Vitals:   11/18/17 0816  BP: 122/86  Pulse: 84  Resp: 16  Temp: (!) 97.5 F (36.4 C)  TempSrc: Oral  Weight: 189 lb (85.7 kg)     Physical Exam  Constitutional: He is oriented to person, place, and time. He appears well-developed and well-nourished.  HENT:  Head: Normocephalic and atraumatic.  Right Ear: External ear normal.  Left Ear: External ear normal.  Nose: Nose normal.  Eyes: Conjunctivae are normal. No scleral icterus.  Neck: No thyromegaly present.  Cardiovascular: Normal rate, regular rhythm and normal heart sounds.  Pulmonary/Chest: Effort normal.  Abdominal: Soft.  Musculoskeletal: He exhibits no edema.  Neurological: He is alert and oriented to person, place, and time.  Skin: Skin is warm and dry.  Psychiatric: He has a normal mood and affect. His behavior is normal. Judgment and thought content normal.        Assessment & Plan:      1. Other insomnia  - temazepam (RESTORIL) 30 MG capsule; Take 1 capsule (30 mg total) by mouth at bedtime.  Dispense: 90 capsule; Refill: 1  2. Coronary artery disease involving native coronary artery of native heart without angina pectoris   3. Adult hypothyroidism   4. Major depressive disorder, single episode, mild ([place])   5. Atrial fibrillation, unspecified type ([place]) Pt feels better. 6.HTN  I have done the exam and reviewed the chart and it is accurate to the best of my knowledge. Development worker, community has been used and  any errors in dictation or transcription are unintentional. Miguel Aschoff M.D. [place], MD  [facility]

## 2017-11-30 MED ORDER — TEMAZEPAM 30 MG PO CAPS: 30.0000 mg | ORAL_CAPSULE | Freq: Every day | ORAL | 1 refills | Status: DC

## 2017-12-08 ENCOUNTER — Other Ambulatory Visit: Payer: Self-pay | Admitting: Family Medicine

## 2017-12-08 DIAGNOSIS — E119 Type 2 diabetes mellitus without complications: Secondary | ICD-10-CM

## 2017-12-08 DIAGNOSIS — E785 Hyperlipidemia, unspecified: Secondary | ICD-10-CM

## 2017-12-08 DIAGNOSIS — I10 Essential (primary) hypertension: Secondary | ICD-10-CM

## 2017-12-08 DIAGNOSIS — F320 Major depressive disorder, single episode, mild: Secondary | ICD-10-CM

## 2017-12-08 DIAGNOSIS — E039 Hypothyroidism, unspecified: Secondary | ICD-10-CM

## 2017-12-15 ENCOUNTER — Ambulatory Visit: Payer: 59 | Admitting: Family Medicine

## 2017-12-15 VITALS — BP 120/86 | HR 71 | Temp 97.8°F | Resp 16 | Wt 188.0 lb

## 2017-12-15 DIAGNOSIS — F320 Major depressive disorder, single episode, mild: Secondary | ICD-10-CM

## 2017-12-15 DIAGNOSIS — E119 Type 2 diabetes mellitus without complications: Secondary | ICD-10-CM | POA: Diagnosis not present

## 2017-12-15 DIAGNOSIS — I2510 Atherosclerotic heart disease of native coronary artery without angina pectoris: Secondary | ICD-10-CM

## 2017-12-15 DIAGNOSIS — I10 Essential (primary) hypertension: Secondary | ICD-10-CM | POA: Diagnosis not present

## 2017-12-15 DIAGNOSIS — E785 Hyperlipidemia, unspecified: Secondary | ICD-10-CM | POA: Diagnosis not present

## 2017-12-15 DIAGNOSIS — E039 Hypothyroidism, unspecified: Secondary | ICD-10-CM | POA: Diagnosis not present

## 2017-12-15 DIAGNOSIS — F325 Major depressive disorder, single episode, in full remission: Secondary | ICD-10-CM

## 2017-12-15 LAB — POCT GLYCOSYLATED HEMOGLOBIN (HGB A1C): Hemoglobin A1C: 6.5 % — AB (ref 4.0–5.6)

## 2017-12-15 MED ORDER — EZETIMIBE 10 MG PO TABS: 10.0000 mg | ORAL_TABLET | Freq: Every day | ORAL | 3 refills | Status: DC

## 2017-12-15 MED ORDER — METFORMIN HCL 1000 MG PO TABS: ORAL_TABLET | ORAL | 3 refills | Status: DC

## 2017-12-15 MED ORDER — METOPROLOL SUCCINATE ER 100 MG PO TB24: ORAL_TABLET | ORAL | 3 refills | Status: DC

## 2017-12-15 MED ORDER — LEVOTHYROXINE SODIUM 75 MCG PO TABS: ORAL_TABLET | ORAL | 3 refills | Status: DC

## 2017-12-15 MED ORDER — SERTRALINE HCL 100 MG PO TABS: 100.0000 mg | ORAL_TABLET | Freq: Every day | ORAL | 3 refills | Status: DC

## 2017-12-15 NOTE — Progress Notes (Signed)
[name] [name]  MRN: [id number] DOB: 1955/11/10  Subjective:  HPI   The patient is a 62 year old male who presents for follow up.  He was last seen on 11/18/17 for atrial fibrillation and elevated blood pressure.  At that time reported he was feeling better.  Prior to that visit he was seen in September and had his Diltiazem increased.  He has felt better since  he has been on diltiazem. He is also here for follow up of his diabetes.  His last A1C was on 08/19/17 and it was 6.4.  His home readings have been running in the 80's-90's with occasional 150.    Patient Active Problem List   Diagnosis Date Noted  . Coronary artery disease involving native coronary artery of native heart without angina pectoris 12/07/2014  . Well controlled diabetes mellitus ([place]) 12/07/2014  . Major depressive disorder, single episode, mild ([place]) 12/07/2014  . Essential (primary) hypertension 12/07/2014  . HLD (hyperlipidemia) 12/07/2014  . Adult hypothyroidism 12/07/2014  . Cannot sleep 12/07/2014  . Overweight 12/07/2014  . Raynaud's syndrome 12/07/2014  . Avitaminosis D 12/07/2014  . Acute myocardial infarction ([place]) 05/31/2006    No past medical history on file.  Social History   Socioeconomic History  . Marital status: Married    Spouse name: Not on file  . Number of children: Not on file  . Years of education: Not on file  . Highest education level: Not on file  Occupational History  . Not on file  Social Needs  . Financial resource strain: Not on file  . Food insecurity:    Worry: Not on file    Inability: Not on file  . Transportation needs:    Medical: Not on file    Non-medical: Not on file  Tobacco Use  . Smoking status: Never Smoker  . Smokeless tobacco: Never Used  Substance and Sexual Activity  . Alcohol use: Yes    Comment: 2-3 beer on the weekends  . Drug use: No  . Sexual activity: Yes  Lifestyle  . Physical activity:    Days per week: Not on file    Minutes per  session: Not on file  . Stress: Not on file  Relationships  . Social connections:    Talks on phone: Not on file    Gets together: Not on file    Attends religious service: Not on file    Active member of club or organization: Not on file    Attends meetings of clubs or organizations: Not on file    Relationship status: Not on file  . Intimate partner violence:    Fear of current or ex partner: Not on file    Emotionally abused: Not on file    Physically abused: Not on file    Forced sexual activity: Not on file  Other Topics Concern  . Not on file  Social History Narrative  . Not on file    Outpatient Encounter Medications as of 12/15/2017  Medication Sig Note  . aspirin 81 MG tablet Take by mouth. 12/07/2014: Received from: [facility]  . atorvastatin (LIPITOR) 20 MG tablet Take 1 tablet (20 mg total) by mouth daily at 6 PM.   . cholecalciferol (VITAMIN D) 1000 UNITS tablet Take by mouth. 12/07/2014: Received from: [facility]  . Coenzyme Q10 100 MG TABS Take by mouth. 12/07/2014: Received from: [facility]  . diltiazem (DILACOR XR) 240 MG 24 hr capsule Take 1  capsule (240 mg total) by mouth daily.   Marland Kitchen ezetimibe (ZETIA) 10 MG tablet TAKE 1 TABLET BY MOUTH  DAILY   . fluticasone (FLONASE) 50 MCG/ACT nasal spray Place into the nose. Reported on 08/14/2015 12/07/2014: Received from: [facility]  . glucose blood (ONE TOUCH ULTRA TEST) test strip USE TO CHECK BLOOD SUGAR  ONCE DAILY   . lansoprazole (PREVACID 24HR) 15 MG capsule Take by mouth. 12/07/2014: Received from: [facility]  . levothyroxine (SYNTHROID, LEVOTHROID) 75 MCG tablet TAKE 1 TABLET BY MOUTH  DAILY BEFORE BREAKFAST   . meloxicam (MOBIC) 15 MG tablet TAKE 1 TABLET (15 MG TOTAL) DAILY BY MOUTH.   . metFORMIN (GLUCOPHAGE) 1000 MG tablet TAKE 1 TABLET BY MOUTH 2  TIMES DAILY WITH A MEAL   . metoprolol succinate (TOPROL-XL) 100 MG 24 hr  tablet TAKE 1 TABLET BY MOUTH  DAILY WITH OR IMMEDIATELY  FOLLOWING A MEAL.   . montelukast (SINGULAIR) 10 MG tablet Take 1 tablet (10 mg total) by mouth at bedtime.   . nitroGLYCERIN (NITROSTAT) 0.4 MG SL tablet Place 1 tablet (0.4 mg total) under the tongue every 5 (five) minutes as needed for chest pain.   Marland Kitchen ondansetron (ZOFRAN ODT) 8 MG disintegrating tablet Take by mouth. Reported on 04/25/2015 12/07/2014: Received from: [facility]  . pyridOXINE (VITAMIN B-6) 100 MG tablet Take 100 mg by mouth daily.   . Saw Palmetto 160 MG TABS Take by mouth. 12/07/2014: Received from: [facility]  . sertraline (ZOLOFT) 100 MG tablet TAKE 1 TABLET DAILY BY  MOUTH.   . temazepam (RESTORIL) 30 MG capsule Take 1 capsule (30 mg total) by mouth at bedtime.    No facility-administered encounter medications on file as of 12/15/2017.     Allergies  Allergen Reactions  . Enalapril Swelling    Lip swelling    Review of Systems  Constitutional: Negative for fever and malaise/fatigue.  Eyes: Negative.   Respiratory: Negative for cough, shortness of breath and wheezing.   Cardiovascular: Negative for chest pain, palpitations, orthopnea, claudication and leg swelling.  Gastrointestinal: Negative.   Genitourinary: Negative.   Skin: Negative.   Neurological: Negative.   Endo/Heme/Allergies: Negative.   Psychiatric/Behavioral: Negative.     Objective:  BP 120/86 (BP Location: Right Arm, Patient Position: Sitting, Cuff Size: Normal)   Pulse 71   Temp 97.8 F (36.6 C) (Oral)   Resp 16   Wt 188 lb (85.3 kg)   SpO2 98%   BMI 26.98 kg/m   Physical Exam  Constitutional: He is oriented to person, place, and time and well-developed, well-nourished, and in no distress.  HENT:  Head: Normocephalic and atraumatic.  Right Ear: External ear normal.  Left Ear: External ear normal.  Nose: Nose normal.  Eyes: Conjunctivae are normal. No scleral icterus.  Neck: No thyromegaly  present.  Cardiovascular: Normal rate, regular rhythm, normal heart sounds and intact distal pulses.  Pulmonary/Chest: Effort normal and breath sounds normal.  Abdominal: Soft.  Neurological: He is alert and oriented to person, place, and time. Gait normal. GCS score is 15.  Skin: Skin is warm and dry.  Psychiatric: Mood, memory, affect and judgment normal.    Assessment and Plan :   1. Essential (primary) hypertension Stable, patient feeling well.  Patient requested refill. - metoprolol succinate (TOPROL-XL) 100 MG 24 hr tablet; TAKE 1 TABLET BY MOUTH  DAILY WITH OR IMMEDIATELY  FOLLOWING A MEAL.  Dispense: 90 tablet; Refill: 3  2. Well  controlled diabetes mellitus ([place])  Stable, patient feeling well.  Patient requested refill. - POCT glycosylated hemoglobin (Hb A1C) - metFORMIN (GLUCOPHAGE) 1000 MG tablet; TAKE 1 TABLET BY MOUTH 2  TIMES DAILY WITH A MEAL  Dispense: 180 tablet; Refill: 3  3. Adult hypothyroidism  No complaints by patient of any associated symptoms.  Patient requested refill. - levothyroxine (SYNTHROID, LEVOTHROID) 75 MCG tablet; TAKE 1 TABLET BY MOUTH  DAILY BEFORE BREAKFAST  Dispense: 90 tablet; Refill: 3  4. Hyperlipidemia, unspecified hyperlipidemia type  Patient requested refill. - ezetimibe (ZETIA) 10 MG tablet; Take 1 tablet (10 mg total) by mouth daily.  Dispense: 90 tablet; Refill: 3  5. Depression, major, single episode, complete remission [facility])  Patient requested refill.  Will try to wean patient off medication in the spring. - sertraline (ZOLOFT) 100 MG tablet; Take 1 tablet (100 mg total) by mouth daily.  Dispense: 90 tablet; Refill: 3 6.CAD Risk factors treated and doing well.   HPI, Exam and A&P Transcribed under the direction and in the presence of Miguel Aschoff, Brooke Bonito., MD. Electronically Signed: Althea Charon, RMA I have done the exam and reviewed the chart and it is accurate to the best of my knowledge. Development worker, community has been used and   any errors in dictation or transcription are unintentional. Miguel Aschoff M.D. [facility]

## 2018-01-19 ENCOUNTER — Ambulatory Visit: Payer: 59 | Admitting: Family Medicine

## 2018-01-19 ENCOUNTER — Encounter: Payer: Self-pay | Admitting: Family Medicine

## 2018-01-19 VITALS — BP 102/60 | HR 91 | Temp 98.0°F | Resp 16 | Wt 188.0 lb

## 2018-01-19 DIAGNOSIS — J069 Acute upper respiratory infection, unspecified: Secondary | ICD-10-CM

## 2018-01-19 MED ORDER — AMOXICILLIN 875 MG PO TABS: 875.0000 mg | ORAL_TABLET | Freq: Two times a day (BID) | ORAL | 0 refills | Status: DC

## 2018-01-19 NOTE — Progress Notes (Signed)
Patient: [name] [name] Male    DOB: Jul 31, 1955   62 y.o.   MRN: [id number] Visit Date: 01/19/2018  Today's Provider: Vernie Murders, PA   Chief Complaint  Patient presents with  . Cough   Subjective:     Cough  This is a new problem. The current episode started in the past 7 days (2 days). The cough is productive of sputum. Associated symptoms include chills, ear congestion, headaches, myalgias, nasal congestion, postnasal drip, rhinorrhea, a sore throat and sweats. Pertinent negatives include no chest pain, ear pain, fever, heartburn, hemoptysis, rash, shortness of breath or wheezing. Nothing aggravates the symptoms. Treatments tried: mucinex  The treatment provided mild relief. His past medical history is significant for bronchitis and environmental allergies. There is no history of pneumonia.   Patient has had a cough and sinus congestion for 2 days. Cough is productive. Patient also has symptoms of chills, sweats, headaches, muscle aches, sore throat, sinus pressure and runny nose. Patient has been taking otc Mucinex with mild relief.   Patient Active Problem List   Diagnosis Date Noted  . Coronary artery disease involving native coronary artery of native heart without angina pectoris 12/07/2014  . Well controlled diabetes mellitus ([place]) 12/07/2014  . Major depressive disorder, single episode, mild ([place]) 12/07/2014  . Essential (primary) hypertension 12/07/2014  . HLD (hyperlipidemia) 12/07/2014  . Adult hypothyroidism 12/07/2014  . Cannot sleep 12/07/2014  . Overweight 12/07/2014  . Raynaud's syndrome 12/07/2014  . Avitaminosis D 12/07/2014  . Acute myocardial infarction [facility]) 05/31/2006   Past Surgical History:  Procedure Laterality Date  . CARDIAC SURGERY  05/30/05   stent placement   Family History  Problem Relation Age of Onset  . Diabetes Mother   . Heart disease Mother   . Hypertension Mother   . Skin cancer Mother   . Glaucoma Mother   . Ulcers  Mother   . Gout Mother   . Heart disease Father   . Hypertension Father   . Prostate cancer Father   . Hypertension Sister   . Hypertension Sister    Allergies  Allergen Reactions  . Enalapril Swelling    Lip swelling    Current Outpatient Medications:  .  aspirin 81 MG tablet, Take by mouth., Disp: , Rfl:  .  atorvastatin (LIPITOR) 20 MG tablet, Take 1 tablet (20 mg total) by mouth daily at 6 PM., Disp: 90 tablet, Rfl: 3 .  cholecalciferol (VITAMIN D) 1000 UNITS tablet, Take by mouth., Disp: , Rfl:  .  Coenzyme Q10 100 MG TABS, Take by mouth., Disp: , Rfl:  .  diltiazem (DILACOR XR) 240 MG 24 hr capsule, Take 1 capsule (240 mg total) by mouth daily., Disp: 90 capsule, Rfl: 3 .  ezetimibe (ZETIA) 10 MG tablet, Take 1 tablet (10 mg total) by mouth daily., Disp: 90 tablet, Rfl: 3 .  fluticasone (FLONASE) 50 MCG/ACT nasal spray, Place into the nose. Reported on 08/14/2015, Disp: , Rfl:  .  glucose blood (ONE TOUCH ULTRA TEST) test strip, USE TO CHECK BLOOD SUGAR  ONCE DAILY, Disp: 100 each, Rfl: 11 .  lansoprazole (PREVACID 24HR) 15 MG capsule, Take by mouth., Disp: , Rfl:  .  levothyroxine (SYNTHROID, LEVOTHROID) 75 MCG tablet, TAKE 1 TABLET BY MOUTH  DAILY BEFORE BREAKFAST, Disp: 90 tablet, Rfl: 3 .  meloxicam (MOBIC) 15 MG tablet, TAKE 1 TABLET (15 MG TOTAL) DAILY BY MOUTH., Disp: 30 tablet, Rfl: 5 .  metFORMIN (GLUCOPHAGE) 1000  MG tablet, TAKE 1 TABLET BY MOUTH 2  TIMES DAILY WITH A MEAL, Disp: 180 tablet, Rfl: 3 .  metoprolol succinate (TOPROL-XL) 100 MG 24 hr tablet, TAKE 1 TABLET BY MOUTH  DAILY WITH OR IMMEDIATELY  FOLLOWING A MEAL., Disp: 90 tablet, Rfl: 3 .  montelukast (SINGULAIR) 10 MG tablet, Take 1 tablet (10 mg total) by mouth at bedtime., Disp: 90 tablet, Rfl: 3 .  nitroGLYCERIN (NITROSTAT) 0.4 MG SL tablet, Place 1 tablet (0.4 mg total) under the tongue every 5 (five) minutes as needed for chest pain., Disp: 100 tablet, Rfl: 5 .  ondansetron (ZOFRAN ODT) 8 MG disintegrating  tablet, Take by mouth. Reported on 04/25/2015, Disp: , Rfl:  .  pyridOXINE (VITAMIN B-6) 100 MG tablet, Take 100 mg by mouth daily., Disp: , Rfl:  .  Saw Palmetto 160 MG TABS, Take by mouth., Disp: , Rfl:  .  sertraline (ZOLOFT) 100 MG tablet, Take 1 tablet (100 mg total) by mouth daily., Disp: 90 tablet, Rfl: 3 .  temazepam (RESTORIL) 30 MG capsule, Take 1 capsule (30 mg total) by mouth at bedtime., Disp: 90 capsule, Rfl: 1  Review of Systems  Constitutional: Positive for chills. Negative for appetite change and fever.  HENT: Positive for congestion, postnasal drip, rhinorrhea, sinus pressure, sinus pain and sore throat. Negative for ear pain.   Respiratory: Positive for cough. Negative for hemoptysis, chest tightness, shortness of breath and wheezing.   Cardiovascular: Negative for chest pain and palpitations.  Gastrointestinal: Negative for abdominal pain, heartburn, nausea and vomiting.  Musculoskeletal: Positive for myalgias.  Skin: Negative for rash.  Allergic/Immunologic: Positive for environmental allergies.  Neurological: Positive for headaches.   Social History   Tobacco Use  . Smoking status: Never Smoker  . Smokeless tobacco: Never Used  Substance Use Topics  . Alcohol use: Yes    Comment: 2-3 beer on the weekends     Objective:   BP 102/60 (BP Location: Right Arm, Patient Position: Sitting, Cuff Size: Large)   Pulse 91   Temp 98 F (36.7 C) (Oral)   Resp 16   Wt 188 lb (85.3 kg)   SpO2 97%   BMI 26.98 kg/m  Vitals:   01/19/18 1625  BP: 102/60  Pulse: 91  Resp: 16  Temp: 98 F (36.7 C)  TempSrc: Oral  SpO2: 97%  Weight: 188 lb (85.3 kg)   Physical Exam Constitutional:      General: He is not in acute distress.    Appearance: He is well-developed.  HENT:     Head: Normocephalic and atraumatic.     Right Ear: Hearing, tympanic membrane and ear canal normal.     Left Ear: Hearing, tympanic membrane and ear canal normal.     Nose: Congestion present.      Comments: Reddened membranes. Tender sinuses to palpation. Eyes:     General: Lids are normal. No scleral icterus.       Right eye: No discharge.        Left eye: No discharge.     Conjunctiva/sclera: Conjunctivae normal.  Neck:     Musculoskeletal: Normal range of motion and neck supple.  Cardiovascular:     Rate and Rhythm: Normal rate and regular rhythm.     Pulses: Normal pulses.     Heart sounds: Normal heart sounds.  Pulmonary:     Effort: Pulmonary effort is normal. No respiratory distress.     Breath sounds: Normal breath sounds. No wheezing, rhonchi or rales.  Abdominal:     Palpations: Abdomen is soft.  Musculoskeletal: Normal range of motion.  Skin:    Findings: No lesion or rash.  Neurological:     Mental Status: He is alert and oriented to person, place, and time.  Psychiatric:        Speech: Speech normal.        Behavior: Behavior normal.        Thought Content: Thought content normal.       Assessment & Plan    1. URI with cough and congestion Onset with cough, slight headache and rhinorrhea the past 2 days. No fever but has had some chills and sweats. Tenderness to palpate sinuses but good transillumination with red nasal membranes. Throat clean and no lymphadenopathy. May use Delsym or Mucinex-DM with Tylenol or Advil. Add Amoxil for 10 days if purulent mucus starts or sinus pressure worsens. Recheck in 7-10 days if needed. - amoxicillin (AMOXIL) 875 MG tablet; Take 1 tablet (875 mg total) by mouth 2 (two) times daily.  Dispense: 20 tablet; Refill: [place], PA  [facility]

## 2018-04-07 DIAGNOSIS — I2510 Atherosclerotic heart disease of native coronary artery without angina pectoris: Secondary | ICD-10-CM | POA: Diagnosis not present

## 2018-04-17 ENCOUNTER — Other Ambulatory Visit: Payer: Self-pay | Admitting: Family Medicine

## 2018-04-17 DIAGNOSIS — G4709 Other insomnia: Secondary | ICD-10-CM

## 2018-04-17 DIAGNOSIS — M7130 Other bursal cyst, unspecified site: Secondary | ICD-10-CM

## 2018-04-17 MED ORDER — MELOXICAM 15 MG PO TABS: 15.0000 mg | ORAL_TABLET | Freq: Every day | ORAL | 5 refills | Status: DC

## 2018-04-17 NOTE — Telephone Encounter (Signed)
Pt needing refills:  meloxicam (MOBIC) 15 MG tablet   - [id number] - [name], [place] [phone] (Phone) [phone] (Fax)     temazepam (RESTORIL) 30 MG capsule - [facility] - [address] [facility] [place], [place] [phone] (Phone) [phone] (Fax)   Thanks, [employer]

## 2018-04-17 NOTE — Addendum Note (Signed)
Addended by: Ashley Royalty E on: 04/17/2018 10:56 AM   Modules accepted: Orders

## 2018-04-17 NOTE — Telephone Encounter (Signed)
Please note.Marland KitchenMarland KitchenTemazepam is to go to [facility] and Meloxicam is to go to [facility] in [place].  Thanks,   Mickel Baas

## 2018-04-20 ENCOUNTER — Ambulatory Visit: Payer: Self-pay | Admitting: Family Medicine

## 2018-04-20 MED ORDER — TEMAZEPAM 30 MG PO CAPS: 30.0000 mg | ORAL_CAPSULE | Freq: Every day | ORAL | 1 refills | Status: DC

## 2018-04-23 ENCOUNTER — Encounter: Payer: Self-pay | Admitting: Emergency Medicine

## 2018-04-23 ENCOUNTER — Emergency Department
Admission: EM | Admit: 2018-04-23 | Discharge: 2018-04-23 | Disposition: A | Discharge status: Home / Self Care | Payer: 59 | Attending: Student in an Organized Health Care Education/Training Program | Admitting: Student in an Organized Health Care Education/Training Program

## 2018-04-23 ENCOUNTER — Other Ambulatory Visit: Payer: Self-pay

## 2018-04-23 DIAGNOSIS — I10 Essential (primary) hypertension: Secondary | ICD-10-CM | POA: Diagnosis not present

## 2018-04-23 DIAGNOSIS — R55 Syncope and collapse: Secondary | ICD-10-CM

## 2018-04-23 DIAGNOSIS — I2510 Atherosclerotic heart disease of native coronary artery without angina pectoris: Secondary | ICD-10-CM | POA: Insufficient documentation

## 2018-04-23 DIAGNOSIS — Z7982 Long term (current) use of aspirin: Secondary | ICD-10-CM | POA: Diagnosis not present

## 2018-04-23 DIAGNOSIS — R531 Weakness: Secondary | ICD-10-CM | POA: Insufficient documentation

## 2018-04-23 DIAGNOSIS — E119 Type 2 diabetes mellitus without complications: Secondary | ICD-10-CM | POA: Diagnosis not present

## 2018-04-23 DIAGNOSIS — I252 Old myocardial infarction: Secondary | ICD-10-CM | POA: Insufficient documentation

## 2018-04-23 DIAGNOSIS — E039 Hypothyroidism, unspecified: Secondary | ICD-10-CM | POA: Insufficient documentation

## 2018-04-23 DIAGNOSIS — Z79899 Other long term (current) drug therapy: Secondary | ICD-10-CM | POA: Diagnosis not present

## 2018-04-23 LAB — CBC
HCT: 39.9 % (ref 39.0–52.0)
Hemoglobin: 13.1 g/dL (ref 13.0–17.0)
MCH: 29.4 pg (ref 26.0–34.0)
MCHC: 32.8 g/dL (ref 30.0–36.0)
MCV: 89.5 fL (ref 80.0–100.0)
Platelets: 200 10*3/uL (ref 150–400)
RBC: 4.46 MIL/uL (ref 4.22–5.81)
RDW: 13.5 % (ref 11.5–15.5)
WBC: 7.1 10*3/uL (ref 4.0–10.5)
nRBC: 0 % (ref 0.0–0.2)

## 2018-04-23 LAB — URINALYSIS, COMPLETE (UACMP) WITH MICROSCOPIC
Bacteria, UA: NONE SEEN
Bilirubin Urine: NEGATIVE
GLUCOSE, UA: NEGATIVE mg/dL
Hgb urine dipstick: NEGATIVE
Ketones, ur: NEGATIVE mg/dL
Leukocytes,Ua: NEGATIVE
Nitrite: NEGATIVE
Protein, ur: NEGATIVE mg/dL
Specific Gravity, Urine: 1.002 — ABNORMAL LOW (ref 1.005–1.030)
Squamous Epithelial / HPF: NONE SEEN (ref 0–5)
pH: 6 (ref 5.0–8.0)

## 2018-04-23 LAB — BASIC METABOLIC PANEL
Anion gap: 10 (ref 5–15)
BUN: 12 mg/dL (ref 8–23)
CO2: 26 mmol/L (ref 22–32)
Calcium: 9.5 mg/dL (ref 8.9–10.3)
Chloride: 103 mmol/L (ref 98–111)
Creatinine, Ser: 0.87 mg/dL (ref 0.61–1.24)
GFR calc Af Amer: >60 mL/min (ref >60)
GFR calc non Af Amer: >60 mL/min (ref >60)
Glucose, Bld: 156 mg/dL — ABNORMAL HIGH (ref 70–99)
Potassium: 3.7 mmol/L (ref 3.5–5.1)
Sodium: 139 mmol/L (ref 135–145)

## 2018-04-23 LAB — TROPONIN I
Troponin I: <0.03 ng/mL (ref <0.03)
Troponin I: <0.03 ng/mL (ref <0.03)

## 2018-04-23 LAB — GLUCOSE, CAPILLARY: Glucose-Capillary: 127 mg/dL — ABNORMAL HIGH (ref 70–99)

## 2018-04-23 MED ORDER — SODIUM CHLORIDE 0.9% FLUSH: 3.0000 mL | Freq: Once | INTRAVENOUS | Status: DC

## 2018-04-23 NOTE — ED Notes (Signed)
Pt provided sandwich tray at this time

## 2018-04-23 NOTE — ED Provider Notes (Signed)
[facility] Emergency Department Provider Note    First MD Initiated Contact with Patient 04/23/18 1518     (approximate)  I have reviewed the triage vital signs and the nursing notes.   HISTORY  Chief Complaint Weakness    HPI [name] [name] is a 63 y.o. male remote history of CAD status post resents the ER for episode of feeling lightheaded.  Describes his symptoms as feeling as though the "ground was coming up towards his head ".  This occurred this morning.  States he was not having any focal chest pain.  States he is a borderline diabetic.  Will like his blood sugar was low and so he ate something with some improvement.  Denied any chest pressure.  No shortness of breath.  Denies any numbness or tingling.  No headache.  Is never had symptoms like this before.  Was directed to the ER at the insistence of his wife.  No sick contacts.  Did not become diaphoretic during this episode.    History reviewed. No pertinent past medical history. Family History  Problem Relation Age of Onset  . Diabetes Mother   . Heart disease Mother   . Hypertension Mother   . Skin cancer Mother   . Glaucoma Mother   . Ulcers Mother   . Gout Mother   . Heart disease Father   . Hypertension Father   . Prostate cancer Father   . Hypertension Sister   . Hypertension Sister    Past Surgical History:  Procedure Laterality Date  . CARDIAC SURGERY  05/30/05   stent placement   Patient Active Problem List   Diagnosis Date Noted  . Coronary artery disease involving native coronary artery of native heart without angina pectoris 12/07/2014  . Well controlled diabetes mellitus ([place]) 12/07/2014  . Major depressive disorder, single episode, mild ([place]) 12/07/2014  . Essential (primary) hypertension 12/07/2014  . HLD (hyperlipidemia) 12/07/2014  . Adult hypothyroidism 12/07/2014  . Cannot sleep 12/07/2014  . Overweight 12/07/2014  . Raynaud's syndrome 12/07/2014  .  Avitaminosis D 12/07/2014  . Acute myocardial infarction ([place]) 05/31/2006      Prior to Admission medications   Medication Sig Start Date End Date Taking? Authorizing Provider  amoxicillin (AMOXIL) 875 MG tablet Take 1 tablet (875 mg total) by mouth 2 (two) times daily. 01/19/18   Chrismon, Vickki Muff, PA  aspirin 81 MG tablet Take by mouth. 12/03/11   [provider]  atorvastatin (LIPITOR) 20 MG tablet Take 1 tablet (20 mg total) by mouth daily at 6 PM. 08/19/17   Jerrol Banana., MD  cholecalciferol (VITAMIN D) 1000 UNITS tablet Take by mouth. 12/03/11   [provider]  Coenzyme Q10 100 MG TABS Take by mouth. 12/03/11   [provider]  diltiazem (DILACOR XR) 240 MG 24 hr capsule Take 1 capsule (240 mg total) by mouth daily. 10/16/17   Jerrol Banana., MD  Esomeprazole Magnesium (NEXIUM PO) Take by mouth.    [provider]  ezetimibe (ZETIA) 10 MG tablet Take 1 tablet (10 mg total) by mouth daily. 12/15/17   Jerrol Banana., MD  fluticasone [facility]) 50 MCG/ACT nasal spray Place into the nose. Reported on 08/14/2015 07/17/12   [provider]  glucose blood (ONE TOUCH ULTRA TEST) test strip USE TO CHECK BLOOD SUGAR  ONCE DAILY 11/18/17   Jerrol Banana., MD  lansoprazole (PREVACID 24HR) 15 MG capsule Take by mouth. 06/11/12  [provider]  levothyroxine (SYNTHROID, LEVOTHROID) 75 MCG tablet TAKE 1 TABLET BY MOUTH  DAILY BEFORE BREAKFAST 12/15/17   Jerrol Banana., MD  meloxicam (MOBIC) 15 MG tablet Take 1 tablet (15 mg total) by mouth daily. 04/17/18   Jerrol Banana., MD  metFORMIN (GLUCOPHAGE) 1000 MG tablet TAKE 1 TABLET BY MOUTH 2  TIMES DAILY WITH A MEAL 12/15/17   Jerrol Banana., MD  metoprolol succinate (TOPROL-XL) 100 MG 24 hr tablet TAKE 1 TABLET BY MOUTH  DAILY WITH OR IMMEDIATELY  FOLLOWING A MEAL. 12/15/17   Jerrol Banana., MD  montelukast (SINGULAIR) 10 MG tablet Take 1  tablet (10 mg total) by mouth at bedtime. Patient not taking: Reported on 01/19/2018 04/17/16   Jerrol Banana., MD  nitroGLYCERIN (NITROSTAT) 0.4 MG SL tablet Place 1 tablet (0.4 mg total) under the tongue every 5 (five) minutes as needed for chest pain. 11/23/15   Jerrol Banana., MD  ondansetron (ZOFRAN ODT) 8 MG disintegrating tablet Take by mouth. Reported on 04/25/2015 05/20/11   [provider]  pyridOXINE (VITAMIN B-6) 100 MG tablet Take 100 mg by mouth daily.    [provider]  Saw Palmetto 160 MG TABS Take by mouth. 12/03/11   [provider]  sertraline (ZOLOFT) 100 MG tablet Take 1 tablet (100 mg total) by mouth daily. 12/15/17   Jerrol Banana., MD  temazepam (RESTORIL) 30 MG capsule Take 1 capsule (30 mg total) by mouth at bedtime. 04/20/18   Jerrol Banana., MD    Allergies Enalapril    Social History Social History   Tobacco Use  . Smoking status: Never Smoker  . Smokeless tobacco: Never Used  Substance Use Topics  . Alcohol use: Yes    Comment: 2-3 beer on the weekends  . Drug use: No    Review of Systems Patient denies headaches, rhinorrhea, blurry vision, numbness, shortness of breath, chest pain, edema, cough, abdominal pain, nausea, vomiting, diarrhea, dysuria, fevers, rashes or hallucinations unless otherwise stated above in HPI. ____________________________________________   PHYSICAL EXAM:  VITAL SIGNS: Vitals:   04/23/18 1602 04/23/18 1730  BP:  (!) 157/96  Pulse: 64 73  Resp: 18 19  Temp:    SpO2: 98% 95%    Constitutional: Alert and oriented.  Eyes: Conjunctivae are normal.  Head: Atraumatic. Nose: No congestion/rhinnorhea. Mouth/Throat: Mucous membranes are moist.   Neck: No stridor. Painless ROM.  Cardiovascular: Normal rate, regular rhythm. Grossly normal heart sounds.  Good peripheral circulation. Respiratory: Normal respiratory effort.  No retractions. Lungs CTAB. Gastrointestinal:  Soft and nontender. No distention. No abdominal bruits. No CVA tenderness. Genitourinary:  Musculoskeletal: No lower extremity tenderness nor edema.  No joint effusions. Neurologic: CN- intact.  No facial droop, Normal FNF.  Normal heel to shin.  Sensation intact bilaterally. Normal speech and language. No gross focal neurologic deficits are appreciated. No gait instability. Skin:  Skin is warm, dry and intact. No rash noted. Psychiatric: Mood and affect are normal. Speech and behavior are normal.  ____________________________________________   LABS (all labs ordered are listed, but only abnormal results are displayed)  Results for orders placed or performed during the hospital encounter of 04/23/18 (from the past 24 hour(s))  Basic metabolic panel     Status: Abnormal   Collection Time: 04/23/18  2:51 PM  Result Value Ref Range   Sodium 139 135 - 145 mmol/L   Potassium 3.7 3.5 - 5.1  mmol/L   Chloride 103 98 - 111 mmol/L   CO2 26 22 - 32 mmol/L   Glucose, Bld 156 (H) 70 - 99 mg/dL   BUN 12 8 - 23 mg/dL   Creatinine, Ser 0.87 0.61 - 1.24 mg/dL   Calcium 9.5 8.9 - 10.3 mg/dL   GFR calc non Af Amer >60 >60 mL/min   GFR calc Af Amer >60 >60 mL/min   Anion gap 10 5 - 15  CBC     Status: None   Collection Time: 04/23/18  2:51 PM  Result Value Ref Range   WBC 7.1 4.0 - 10.5 K/uL   RBC 4.46 4.22 - 5.81 MIL/uL   Hemoglobin 13.1 13.0 - 17.0 g/dL   HCT 39.9 39.0 - 52.0 %   MCV 89.5 80.0 - 100.0 fL   MCH 29.4 26.0 - 34.0 pg   MCHC 32.8 30.0 - 36.0 g/dL   RDW 13.5 11.5 - 15.5 %   Platelets 200 150 - 400 K/uL   nRBC 0.0 0.0 - 0.2 %  Troponin I - Add-On to previous collection     Status: None   Collection Time: 04/23/18  2:51 PM  Result Value Ref Range   Troponin I <0.03 <0.03 ng/mL  Glucose, capillary     Status: Abnormal   Collection Time: 04/23/18  2:55 PM  Result Value Ref Range   Glucose-Capillary 127 (H) 70 - 99 mg/dL  Urinalysis, Complete w Microscopic     Status: Abnormal    Collection Time: 04/23/18  3:45 PM  Result Value Ref Range   Color, Urine STRAW (A) YELLOW   APPearance CLEAR (A) CLEAR   Specific Gravity, Urine 1.002 (L) 1.005 - 1.030   pH 6.0 5.0 - 8.0   Glucose, UA NEGATIVE NEGATIVE mg/dL   Hgb urine dipstick NEGATIVE NEGATIVE   Bilirubin Urine NEGATIVE NEGATIVE   Ketones, ur NEGATIVE NEGATIVE mg/dL   Protein, ur NEGATIVE NEGATIVE mg/dL   Nitrite NEGATIVE NEGATIVE   Leukocytes,Ua NEGATIVE NEGATIVE   WBC, UA 0-5 0 - 5 WBC/hpf   Bacteria, UA NONE SEEN NONE SEEN   Squamous Epithelial / LPF NONE SEEN 0 - 5  Troponin I - Once-Timed     Status: None   Collection Time: 04/23/18  5:50 PM  Result Value Ref Range   Troponin I <0.03 <0.03 ng/mL   ____________________________________________  EKG My review and personal interpretation at Time: 14:39   Indication: near syncope  Rate: 60  Rhythm: sinus Axis: normal Other: normal intervals, nostemi ____________________________________________  RADIOLOGY  I personally reviewed all radiographic images ordered to evaluate for the above acute complaints and reviewed radiology reports and findings.  These findings were personally discussed with the patient.  Please see medical record for radiology report.  ____________________________________________   PROCEDURES  Procedure(s) performed:  Procedures    Critical Care performed: no ____________________________________________   INITIAL IMPRESSION / ASSESSMENT AND PLAN / ED COURSE  Pertinent labs & imaging results that were available during my care of the patient were reviewed by me and considered in my medical decision making (see chart for details).   DDX: Agitation, hypoglycemia, vasovagal, dysrhythmia, bradycardia, ACS, anemia  [name] [name] is a 63 y.o. who presents to the ED with symptoms as described above.  He is currently well-appearing in no acute distress.  Physical exam is reassuring.  No focal neuro deficits.  Does not seem  consistent with TIA or CVA.  Possible hypoglycemia or dehydration given improvement in symptoms after  eating.  Nevertheless given his heart history will further re-stratify for ACS with serial enzymes and place patient on cardiac monitoring to evaluate for any bradycardia or dysrhythmia.  Clinical Course as of Apr 23 1818  Thu Apr 23, 2018  4008 Repeat troponin is negative.  Patient is well-appearing.  Repeat exam is reassuring.  I see no indication for hospitalization at this time.  He is able to ambulate with steady gait.  Tolerating oral hydration.  The patient will be placed on continuous pulse oximetry and telemetry for monitoring.  Laboratory evaluation will be sent to evaluate for the above complaints.      [PR]    Clinical Course User Index [PR] Merlyn Lot, MD    The patient was evaluated in Emergency Department today for the symptoms described in the history of present illness. He/she was evaluated in the context of the global COVID-19 pandemic, which necessitated consideration that the patient might be at risk for infection with the SARS-CoV-2 virus that causes COVID-19. Institutional protocols and algorithms that pertain to the evaluation of patients at risk for COVID-19 are in a state of rapid change based on information released by regulatory bodies including the CDC and federal and state organizations. These policies and algorithms were followed during the patient's care in the ED.   As part of my medical decision making, I reviewed the following data within the [place] notes reviewed and incorporated, Labs reviewed, notes from prior ED visits and [place] Controlled Substance Database   ____________________________________________   FINAL CLINICAL IMPRESSION(S) / ED DIAGNOSES  Final diagnoses:  Weakness  Near syncope      NEW MEDICATIONS STARTED DURING THIS VISIT:  New Prescriptions   No medications on file     Note:  This document was  prepared using Dragon voice recognition software and may include unintentional dictation errors.    Merlyn Lot, MD 04/23/18 Tresa Moore

## 2018-04-23 NOTE — ED Triage Notes (Signed)
Patient presents to the ED with generalized, "not feeling well".  Patient states he was at work and was complaining of not feeling well.  Patient states he was walking and he felt like, "the ground was coming up to meet him."  Patient reports vision changes.  Patient states he thought his blood sugar was down.  Patient states he reports eating to try to increase sugar.  Patient had his bp and heart rate checked at work and heart rate was 50.  Patient states his resting heart rate is normally 100 or greater.  Patient states, "i've never felt like this before."  Patient denies chest pain.

## 2018-04-23 NOTE — ED Notes (Signed)
Pt ambulated to restroom, states " feeling of the floor rising on him" has resolved. PT steady gait noted.

## 2018-04-23 NOTE — ED Notes (Signed)
Pt vebralizes d/c understanding and follow up , VSS, ambualtory. PT unable to sign due to signature pad malfnx

## 2018-06-11 ENCOUNTER — Ambulatory Visit: Payer: 59 | Admitting: Family Medicine

## 2018-06-11 ENCOUNTER — Other Ambulatory Visit: Payer: Self-pay

## 2018-06-11 ENCOUNTER — Encounter: Payer: Self-pay | Admitting: Family Medicine

## 2018-06-11 VITALS — BP 138/72 | HR 68 | Temp 98.3°F | Resp 16 | Wt 183.0 lb

## 2018-06-11 DIAGNOSIS — E119 Type 2 diabetes mellitus without complications: Secondary | ICD-10-CM

## 2018-06-11 DIAGNOSIS — I2510 Atherosclerotic heart disease of native coronary artery without angina pectoris: Secondary | ICD-10-CM | POA: Diagnosis not present

## 2018-06-11 DIAGNOSIS — E039 Hypothyroidism, unspecified: Secondary | ICD-10-CM | POA: Diagnosis not present

## 2018-06-11 DIAGNOSIS — F325 Major depressive disorder, single episode, in full remission: Secondary | ICD-10-CM | POA: Diagnosis not present

## 2018-06-11 DIAGNOSIS — I10 Essential (primary) hypertension: Secondary | ICD-10-CM

## 2018-06-11 DIAGNOSIS — E785 Hyperlipidemia, unspecified: Secondary | ICD-10-CM | POA: Diagnosis not present

## 2018-06-11 MED ORDER — SERTRALINE HCL 100 MG PO TABS: 100.0000 mg | ORAL_TABLET | Freq: Every day | ORAL | 3 refills | Status: DC

## 2018-06-11 MED ORDER — LEVOTHYROXINE SODIUM 75 MCG PO TABS: ORAL_TABLET | ORAL | 3 refills | Status: DC

## 2018-06-11 MED ORDER — METFORMIN HCL 1000 MG PO TABS: ORAL_TABLET | ORAL | 3 refills | Status: DC

## 2018-06-11 MED ORDER — EZETIMIBE 10 MG PO TABS: 10.0000 mg | ORAL_TABLET | Freq: Every day | ORAL | 3 refills | Status: DC

## 2018-06-11 MED ORDER — ATORVASTATIN CALCIUM 20 MG PO TABS: 20.0000 mg | ORAL_TABLET | Freq: Every day | ORAL | 3 refills | Status: DC

## 2018-06-11 MED ORDER — METOPROLOL SUCCINATE ER 100 MG PO TB24: ORAL_TABLET | ORAL | 3 refills | Status: DC

## 2018-06-11 NOTE — Progress Notes (Signed)
Patient: [name] [name] Male    DOB: 05-29-1955   63 y.o.   MRN: [id number] Visit Date: 06/11/2018  Today's Provider: [name], MD   Chief Complaint  Patient presents with  . Diabetes  . Hypertension  . Hypothyroidism  . Depression  . Hyperlipidemia   Subjective:     HPI  Diabetes Mellitus Type II, Follow-up:   Lab Results  Component Value Date   HGBA1C 6.5 (A) 12/15/2017   HGBA1C 6.4 (H) 08/19/2017   HGBA1C 6.4 04/15/2017    Last seen for diabetes 6 months ago.  Management since then includes no changes. He reports good compliance with treatment. He is not having side effects.  Current symptoms include none and have been stable. Home blood sugar records: fasting range: 110s  Episodes of hypoglycemia? no   Current Insulin Regimen: none Most Recent Eye Exam: up to date Weight trend: stable Prior visit with dietician: No Current exercise: no regular exercise Current diet habits: well balanced  Pertinent Labs:    Component Value Date/Time   CHOL 116 08/19/2017 0913   TRIG 135 08/19/2017 0913   HDL 45 08/19/2017 0913   LDLCALC 44 08/19/2017 0913   CREATININE 0.87 04/23/2018 1451    Wt Readings from Last 3 Encounters:  06/11/18 183 lb (83 kg)  04/23/18 185 lb (83.9 kg)  01/19/18 188 lb (85.3 kg)    Hypertension, follow-up:  BP Readings from Last 3 Encounters:  06/11/18 138/72  04/23/18 (!) 157/96  01/19/18 102/60    He was last seen for hypertension 6 months ago.  BP at that visit was 120/86. Management since that visit includes no changes. He reports good compliance with treatment. He is not having side effects.  He is exercising. He is adherent to low salt diet.   Outside blood pressures are checked occasionally. He is experiencing none.  Patient denies exertional chest pressure/discomfort, lower extremity edema and palpitations.   Cardiovascular risk factors include diabetes mellitus and dyslipidemia.     Lipid/Cholesterol, Follow-up:   Last seen for this6 months ago.  Management changes since that visit include no changes. . Last Lipid Panel:    Component Value Date/Time   CHOL 116 08/19/2017 0913   TRIG 135 08/19/2017 0913   HDL 45 08/19/2017 0913   CHOLHDL 2.4 04/18/2017 0819   LDLCALC 44 08/19/2017 0913    Risk factors for vascular disease include diabetes mellitus and hypertension  He reports good compliance with treatment. He is not having side effects.  Current symptoms include none and have been stable.  Hypothyroidism, follow up: Patient was last seen in the office 6 months ago. No medications were changed since last visit. He is currently taking levothyroxine 32mcg daily. He reports good compliance, and good symptom control.  Lab Results  Component Value Date   TSH 3.870 08/19/2017   Depression, follow up: Patient was last seen in the office 6 months ago. No medications were changed since last visit. He is currently taking sertraline 100mg  daily, and reports good compliance and good symptom control.  Depression screen [facility] 2/9 06/11/2018 08/19/2017 04/15/2017  Decreased Interest 0 0 0  Down, Depressed, Hopeless 0 0 0  PHQ - 2 Score 0 0 0  Altered sleeping 0 1 1  Tired, decreased energy 1 1 0  Change in appetite 0 0 0  Feeling bad or failure about yourself  0 0 0  Trouble concentrating 0 0 0  Moving slowly or fidgety/restless  0 0 0  Suicidal thoughts 0 0 0  PHQ-9 Score 1 2 1   Difficult doing work/chores Not difficult at all Not difficult at all Not difficult at all     Allergies  Allergen Reactions  . Enalapril Swelling    Lip swelling     Current Outpatient Medications:  .  amoxicillin (AMOXIL) 875 MG tablet, Take 1 tablet (875 mg total) by mouth 2 (two) times daily., Disp: 20 tablet, Rfl: 0 .  aspirin 81 MG tablet, Take by mouth., Disp: , Rfl:  .  atorvastatin (LIPITOR) 20 MG tablet, Take 1 tablet (20 mg total) by mouth daily at 6 PM., Disp: 90 tablet,  Rfl: 3 .  cholecalciferol (VITAMIN D) 1000 UNITS tablet, Take by mouth., Disp: , Rfl:  .  Coenzyme Q10 100 MG TABS, Take by mouth., Disp: , Rfl:  .  diltiazem (DILACOR XR) 240 MG 24 hr capsule, Take 1 capsule (240 mg total) by mouth daily., Disp: 90 capsule, Rfl: 3 .  Esomeprazole Magnesium (NEXIUM PO), Take by mouth., Disp: , Rfl:  .  ezetimibe (ZETIA) 10 MG tablet, Take 1 tablet (10 mg total) by mouth daily., Disp: 90 tablet, Rfl: 3 .  fluticasone (FLONASE) 50 MCG/ACT nasal spray, Place into the nose. Reported on 08/14/2015, Disp: , Rfl:  .  glucose blood (ONE TOUCH ULTRA TEST) test strip, USE TO CHECK BLOOD SUGAR  ONCE DAILY, Disp: 100 each, Rfl: 11 .  lansoprazole (PREVACID 24HR) 15 MG capsule, Take by mouth., Disp: , Rfl:  .  levothyroxine (SYNTHROID, LEVOTHROID) 75 MCG tablet, TAKE 1 TABLET BY MOUTH  DAILY BEFORE BREAKFAST, Disp: 90 tablet, Rfl: 3 .  meloxicam (MOBIC) 15 MG tablet, Take 1 tablet (15 mg total) by mouth daily., Disp: 30 tablet, Rfl: 5 .  metFORMIN (GLUCOPHAGE) 1000 MG tablet, TAKE 1 TABLET BY MOUTH 2  TIMES DAILY WITH A MEAL, Disp: 180 tablet, Rfl: 3 .  metoprolol succinate (TOPROL-XL) 100 MG 24 hr tablet, TAKE 1 TABLET BY MOUTH  DAILY WITH OR IMMEDIATELY  FOLLOWING A MEAL., Disp: 90 tablet, Rfl: 3 .  montelukast (SINGULAIR) 10 MG tablet, Take 1 tablet (10 mg total) by mouth at bedtime. (Patient not taking: Reported on 01/19/2018), Disp: 90 tablet, Rfl: 3 .  nitroGLYCERIN (NITROSTAT) 0.4 MG SL tablet, Place 1 tablet (0.4 mg total) under the tongue every 5 (five) minutes as needed for chest pain., Disp: 100 tablet, Rfl: 5 .  ondansetron (ZOFRAN ODT) 8 MG disintegrating tablet, Take by mouth. Reported on 04/25/2015, Disp: , Rfl:  .  pyridOXINE (VITAMIN B-6) 100 MG tablet, Take 100 mg by mouth daily., Disp: , Rfl:  .  Saw Palmetto 160 MG TABS, Take by mouth., Disp: , Rfl:  .  sertraline (ZOLOFT) 100 MG tablet, Take 1 tablet (100 mg total) by mouth daily., Disp: 90 tablet, Rfl: 3 .   temazepam (RESTORIL) 30 MG capsule, Take 1 capsule (30 mg total) by mouth at bedtime., Disp: 90 capsule, Rfl: 1  Review of Systems  Constitutional: Negative.  Negative for activity change, appetite change, chills and fever.  Eyes: Negative.   Respiratory: Negative.  Negative for cough and shortness of breath.   Cardiovascular: Negative.  Negative for chest pain, palpitations and leg swelling.  Gastrointestinal: Negative.   Endocrine: Negative.  Negative for cold intolerance, heat intolerance, polydipsia, polyphagia and polyuria.  Musculoskeletal: Negative.  Negative for arthralgias and neck pain.  Allergic/Immunologic: Negative.   Neurological: Negative for dizziness, light-headedness and headaches.  Psychiatric/Behavioral: Negative.  Negative for agitation, decreased concentration, self-injury, sleep disturbance and suicidal ideas. The patient is not nervous/anxious.     Social History   Tobacco Use  . Smoking status: Never Smoker  . Smokeless tobacco: Never Used  Substance Use Topics  . Alcohol use: Yes    Comment: 2-3 beer on the weekends      Objective:   BP 138/72 (BP Location: Left Arm, Patient Position: Sitting, Cuff Size: Normal)   Pulse 68   Temp 98.3 F (36.8 C)   Resp 16   Wt 183 lb (83 kg)   SpO2 97%   BMI 26.26 kg/m  Vitals:   06/11/18 1532  BP: 138/72  Pulse: 68  Resp: 16  Temp: 98.3 F (36.8 C)  SpO2: 97%  Weight: 183 lb (83 kg)     Physical Exam Vitals signs reviewed.  Constitutional:      Appearance: He is well-developed.     Comments: WNWDWM appears younger than his age.  HENT:     Head: Normocephalic and atraumatic.     Right Ear: External ear normal.     Left Ear: External ear normal.     Nose: Nose normal.  Eyes:     Conjunctiva/sclera: Conjunctivae normal.     Pupils: Pupils are equal, round, and reactive to light.  Neck:     Musculoskeletal: Normal range of motion and neck supple.  Cardiovascular:     Rate and Rhythm: Normal rate  and regular rhythm.     Heart sounds: Normal heart sounds.  Pulmonary:     Effort: Pulmonary effort is normal.     Breath sounds: Normal breath sounds.  Abdominal:     General: Bowel sounds are normal.     Palpations: Abdomen is soft.  Genitourinary:    Penis: Normal.      Prostate: Normal.     Rectum: Normal.  Musculoskeletal: Normal range of motion.  Skin:    General: Skin is warm and dry.  Neurological:     Mental Status: He is alert and oriented to person, place, and time.  Psychiatric:        Mood and Affect: Mood normal.        Behavior: Behavior normal.        Thought Content: Thought content normal.        Judgment: Judgment normal.         Assessment & Plan    1. Adult hypothyroidism  - TSH - levothyroxine (SYNTHROID) 75 MCG tablet; TAKE 1 TABLET BY MOUTH  DAILY BEFORE BREAKFAST  Dispense: 90 tablet; Refill: 3  2. Hyperlipidemia, unspecified hyperlipidemia type  - Comprehensive metabolic panel - Lipid panel - atorvastatin (LIPITOR) 20 MG tablet; Take 1 tablet (20 mg total) by mouth daily at 6 PM.  Dispense: 90 tablet; Refill: 3 - ezetimibe (ZETIA) 10 MG tablet; Take 1 tablet (10 mg total) by mouth daily.  Dispense: 90 tablet; Refill: 3  3. Major depression, single episode, in complete remission ([place]) Consider stopping on next visit.PHQ9 is 1. - CBC with Differential/Platelet - sertraline (ZOLOFT) 100 MG tablet; Take 1 tablet (100 mg total) by mouth daily.  Dispense: 90 tablet; Refill: 3  4. Well controlled diabetes mellitus ([place])  - Hemoglobin A1c - metFORMIN (GLUCOPHAGE) 1000 MG tablet; TAKE 1 TABLET BY MOUTH 2  TIMES DAILY WITH A MEAL  Dispense: 180 tablet; Refill: 3  5. Essential (primary) hypertension  - metoprolol succinate (TOPROL-XL) 100 MG 24 hr tablet; TAKE 1 TABLET BY MOUTH  DAILY WITH OR IMMEDIATELY  FOLLOWING A MEAL.  Dispense: 90 tablet; Refill: 3 6.CAD All risk factors treated.     Bettyjean Stefanski Cranford Mon, MD  [facility]

## 2018-06-17 LAB — CBC WITH DIFFERENTIAL/PLATELET
Basophils Absolute: 0.1 10*3/uL (ref 0.0–0.2)
Basos: 2 %
EOS (ABSOLUTE): 0.2 10*3/uL (ref 0.0–0.4)
Eos: 3 %
Hematocrit: 38.6 % (ref 37.5–51.0)
Hemoglobin: 13.4 g/dL (ref 13.0–17.7)
Immature Grans (Abs): 0 10*3/uL (ref 0.0–0.1)
Immature Granulocytes: 0 %
Lymphocytes Absolute: 1.8 10*3/uL (ref 0.7–3.1)
Lymphs: 34 %
MCH: 30.2 pg (ref 26.6–33.0)
MCHC: 34.7 g/dL (ref 31.5–35.7)
MCV: 87 fL (ref 79–97)
Monocytes Absolute: 0.6 10*3/uL (ref 0.1–0.9)
Monocytes: 11 %
Neutrophils Absolute: 2.6 10*3/uL (ref 1.4–7.0)
Neutrophils: 50 %
Platelets: 201 10*3/uL (ref 150–450)
RBC: 4.44 x10E6/uL (ref 4.14–5.80)
RDW: 12.9 % (ref 11.6–15.4)
WBC: 5.2 10*3/uL (ref 3.4–10.8)

## 2018-06-17 LAB — COMPREHENSIVE METABOLIC PANEL
ALT: 34 IU/L (ref 0–44)
AST: 29 IU/L (ref 0–40)
Albumin/Globulin Ratio: 1.8 (ref 1.2–2.2)
Albumin: 4.5 g/dL (ref 3.8–4.8)
Alkaline Phosphatase: 81 IU/L (ref 39–117)
BUN/Creatinine Ratio: 15 (ref 10–24)
BUN: 13 mg/dL (ref 8–27)
Bilirubin Total: 0.5 mg/dL (ref 0.0–1.2)
CO2: 24 mmol/L (ref 20–29)
Calcium: 10 mg/dL (ref 8.6–10.2)
Chloride: 103 mmol/L (ref 96–106)
Creatinine, Ser: 0.84 mg/dL (ref 0.76–1.27)
GFR calc Af Amer: 108 mL/min/{1.73_m2} (ref >59)
GFR calc non Af Amer: 94 mL/min/{1.73_m2} (ref >59)
Globulin, Total: 2.5 g/dL (ref 1.5–4.5)
Glucose: 130 mg/dL — ABNORMAL HIGH (ref 65–99)
Potassium: 4.7 mmol/L (ref 3.5–5.2)
Sodium: 140 mmol/L (ref 134–144)
Total Protein: 7 g/dL (ref 6.0–8.5)

## 2018-06-17 LAB — LIPID PANEL
Chol/HDL Ratio: 2.3 ratio (ref 0.0–5.0)
Cholesterol, Total: 115 mg/dL (ref 100–199)
HDL: 50 mg/dL (ref >39)
LDL Calculated: 38 mg/dL (ref 0–99)
Triglycerides: 137 mg/dL (ref 0–149)
VLDL Cholesterol Cal: 27 mg/dL (ref 5–40)

## 2018-06-17 LAB — HEMOGLOBIN A1C
Est. average glucose Bld gHb Est-mCnc: 143 mg/dL
Hgb A1c MFr Bld: 6.6 % — ABNORMAL HIGH (ref 4.8–5.6)

## 2018-06-17 LAB — TSH: TSH: 3.37 u[IU]/mL (ref 0.450–4.500)

## 2018-06-24 ENCOUNTER — Telehealth: Payer: Self-pay

## 2018-06-24 NOTE — Telephone Encounter (Signed)
-----   Message from Richard L Gilbert Jr., MD sent at 06/20/2018  1:53 PM EDT ----- Labs stable. 

## 2018-06-24 NOTE — Telephone Encounter (Signed)
Pt advised.   Thanks,   -Ronnel Zuercher  

## 2018-10-19 ENCOUNTER — Other Ambulatory Visit: Payer: Self-pay | Admitting: Family Medicine

## 2018-10-19 DIAGNOSIS — I10 Essential (primary) hypertension: Secondary | ICD-10-CM

## 2018-11-02 LAB — HM DIABETES EYE EXAM

## 2018-11-24 ENCOUNTER — Other Ambulatory Visit: Payer: Self-pay | Admitting: Family Medicine

## 2018-11-24 DIAGNOSIS — G4709 Other insomnia: Secondary | ICD-10-CM

## 2018-11-24 MED ORDER — TEMAZEPAM 30 MG PO CAPS: 30.0000 mg | ORAL_CAPSULE | Freq: Every day | ORAL | 1 refills | Status: DC

## 2018-11-24 NOTE — Telephone Encounter (Signed)
Pt needing a 90 day refill on:  temazepam (RESTORIL) 30 MG capsule  Please fill at:  [id number] - [name], [place] AT [address] [phone] (Phone) [phone] (Fax)   Thanks, [employer]

## 2018-12-04 ENCOUNTER — Other Ambulatory Visit: Payer: Self-pay | Admitting: Family Medicine

## 2018-12-04 DIAGNOSIS — E119 Type 2 diabetes mellitus without complications: Secondary | ICD-10-CM

## 2018-12-11 NOTE — Progress Notes (Signed)
Patient: [name] [name] Male    DOB: [birth year]   63 y.o.   MRN: [id number] Visit Date: 12/15/2018  Today's Provider: [name], MD   Chief Complaint  Patient presents with  . Hypertension  . Diabetes   Subjective:     HPI  Patient comes in today for follow-up of his diabetes.  No cardiac symptoms at all.  No complaints today. Adult hypothyroidism From 06/11/2018-labs checked, stable.  Hyperlipidemia, unspecified hyperlipidemia type From 06/11/2018-labs checked, stable.  Major depression, single episode, in complete remission ([place]) From 06/11/2018-Consider stopping on next visit. PHQ9 is 1.  Well controlled diabetes mellitus ([place]) From 06/11/2018-labs checked, stable. Hgb A1c 6.6.  Essential (primary) hypertension From 06/11/2018-labs checked, stable.  CAD From 06/11/2018-labs checked, stable.All risk factors treated.    Allergies  Allergen Reactions  . Enalapril Swelling    Lip swelling     Current Outpatient Medications:  .  aspirin 81 MG tablet, Take by mouth., Disp: , Rfl:  .  atorvastatin (LIPITOR) 20 MG tablet, Take 1 tablet (20 mg total) by mouth daily at 6 PM., Disp: 90 tablet, Rfl: 3 .  cholecalciferol (VITAMIN D) 1000 UNITS tablet, Take by mouth., Disp: , Rfl:  .  Coenzyme Q10 100 MG TABS, Take by mouth., Disp: , Rfl:  .  DILT-XR 240 MG 24 hr capsule, TAKE 1 CAPSULE(240 MG) BY MOUTH DAILY, Disp: 90 capsule, Rfl: 3 .  Esomeprazole Magnesium (NEXIUM PO), Take by mouth., Disp: , Rfl:  .  ezetimibe (ZETIA) 10 MG tablet, Take 1 tablet (10 mg total) by mouth daily., Disp: 90 tablet, Rfl: 3 .  fluticasone (FLONASE) 50 MCG/ACT nasal spray, Place into the nose. Reported on 08/14/2015, Disp: , Rfl:  .  lansoprazole (PREVACID 24HR) 15 MG capsule, Take by mouth., Disp: , Rfl:  .  levothyroxine (SYNTHROID) 75 MCG tablet, TAKE 1 TABLET BY MOUTH  DAILY BEFORE BREAKFAST, Disp: 90 tablet, Rfl: 3 .  metFORMIN (GLUCOPHAGE) 1000 MG tablet, TAKE 1 TABLET BY  MOUTH 2  TIMES DAILY WITH A MEAL, Disp: 180 tablet, Rfl: 3 .  metoprolol succinate (TOPROL-XL) 100 MG 24 hr tablet, TAKE 1 TABLET BY MOUTH  DAILY WITH OR IMMEDIATELY  FOLLOWING A MEAL., Disp: 90 tablet, Rfl: 3 .  nitroGLYCERIN (NITROSTAT) 0.4 MG SL tablet, Place 1 tablet (0.4 mg total) under the tongue every 5 (five) minutes as needed for chest pain., Disp: 100 tablet, Rfl: 5 .  ondansetron (ZOFRAN ODT) 8 MG disintegrating tablet, Take by mouth. Reported on 04/25/2015, Disp: , Rfl:  .  ONETOUCH ULTRA test strip, CHECK BLOOD SUGAR ONCE  DAILY, Disp: 100 strip, Rfl: 3 .  pyridOXINE (VITAMIN B-6) 100 MG tablet, Take 100 mg by mouth daily., Disp: , Rfl:  .  Saw Palmetto 160 MG TABS, Take by mouth., Disp: , Rfl:  .  sertraline (ZOLOFT) 100 MG tablet, Take 1 tablet (100 mg total) by mouth daily., Disp: 90 tablet, Rfl: 3 .  temazepam (RESTORIL) 30 MG capsule, Take 1 capsule (30 mg total) by mouth at bedtime., Disp: 90 capsule, Rfl: 1 .  montelukast (SINGULAIR) 10 MG tablet, Take 1 tablet (10 mg total) by mouth at bedtime. (Patient not taking: Reported on 01/19/2018), Disp: 90 tablet, Rfl: 3  Review of Systems  Constitutional: Negative for appetite change, chills and fever.  HENT: Negative.   Eyes: Negative.   Respiratory: Negative for chest tightness, shortness of breath and wheezing.   Cardiovascular: Negative for chest pain,  palpitations and leg swelling.  Gastrointestinal: Negative for abdominal pain, nausea and vomiting.  Allergic/Immunologic: Negative.   Neurological: Negative.   Hematological: Negative.   Psychiatric/Behavioral: Negative.     Social History   Tobacco Use  . Smoking status: Never Smoker  . Smokeless tobacco: Never Used  Substance Use Topics  . Alcohol use: Yes    Comment: 2-3 beer on the weekends      Objective:   BP 130/80 (BP Location: Right Arm, Patient Position: Sitting, Cuff Size: Normal)   Pulse 78   Temp (!) 97.1 F (36.2 C) (Skin)   Wt 184 lb (83.5 kg)    SpO2 99%   BMI 26.40 kg/m  Vitals:   12/15/18 0920  BP: 130/80  Pulse: 78  Temp: (!) 97.1 F (36.2 C)  TempSrc: Skin  SpO2: 99%  Weight: 184 lb (83.5 kg)  Body mass index is 26.4 kg/m.   Physical Exam Vitals signs reviewed.  Constitutional:      Appearance: He is well-developed.  Eyes:     Conjunctiva/sclera: Conjunctivae normal.     Pupils: Pupils are equal, round, and reactive to light.  Neck:     Musculoskeletal: Normal range of motion and neck supple.  Cardiovascular:     Rate and Rhythm: Normal rate and regular rhythm.     Heart sounds: Normal heart sounds.  Pulmonary:     Effort: Pulmonary effort is normal.     Breath sounds: Normal breath sounds.  Musculoskeletal: Normal range of motion.  Skin:    General: Skin is warm and dry.  Neurological:     Mental Status: He is alert and oriented to person, place, and time.     Deep Tendon Reflexes: Reflexes are normal and symmetric.  Psychiatric:        Behavior: Behavior normal.        Thought Content: Thought content normal.        Judgment: Judgment normal.      No results found for any visits on 12/15/18.     Assessment & Plan    Essential (primary) hypertension  Well controlled diabetes mellitus ([place]) - Plan: POCT HgB A1C  Need for influenza vaccination - Plan: Flu Vaccine QUAD High Dose(Fluad)  Coronary artery disease involving native coronary artery of native heart without angina pectoris  Hyperlipidemia, unspecified hyperlipidemia type , A1c is 6.9.  Patient doing well.  Flu shot given.  Follow-up 4 months.    [name] [name] Mon, MD  [facility]

## 2018-12-14 ENCOUNTER — Ambulatory Visit: Payer: Self-pay | Admitting: Family Medicine

## 2018-12-15 ENCOUNTER — Other Ambulatory Visit: Payer: Self-pay

## 2018-12-15 ENCOUNTER — Ambulatory Visit: Payer: 59 | Admitting: Family Medicine

## 2018-12-15 VITALS — BP 130/80 | HR 78 | Temp 97.1°F | Wt 184.0 lb

## 2018-12-15 DIAGNOSIS — E119 Type 2 diabetes mellitus without complications: Secondary | ICD-10-CM

## 2018-12-15 DIAGNOSIS — Z23 Encounter for immunization: Secondary | ICD-10-CM | POA: Diagnosis not present

## 2018-12-15 DIAGNOSIS — I10 Essential (primary) hypertension: Secondary | ICD-10-CM | POA: Diagnosis not present

## 2018-12-15 DIAGNOSIS — E785 Hyperlipidemia, unspecified: Secondary | ICD-10-CM

## 2018-12-15 DIAGNOSIS — I2510 Atherosclerotic heart disease of native coronary artery without angina pectoris: Secondary | ICD-10-CM | POA: Diagnosis not present

## 2018-12-15 LAB — POCT GLYCOSYLATED HEMOGLOBIN (HGB A1C): Hemoglobin A1C: 6.9 % — AB (ref 4.0–5.6)

## 2018-12-21 ENCOUNTER — Ambulatory Visit: Payer: Self-pay | Admitting: Family Medicine

## 2018-12-29 ENCOUNTER — Ambulatory Visit: Payer: Self-pay | Admitting: Family Medicine

## 2019-04-13 NOTE — Progress Notes (Signed)
Patient: [name] [name] Male    DOB: 1955/06/11   64 y.o.   MRN: [id number] Visit Date: 04/14/2019  Today's Provider: [name], MD   Chief Complaint  Patient presents with  . Follow-up  . Diabetes  . Hypertension  . Hyperlipidemia  . Hypothyroidism  . Coronary Artery Disease  . Depression   Subjective:     HPI  Patient is been doing well.  He suffered a slight concussion about several weeks ago with no sequelae. Occasionally he is slightly lightheaded when he first stands up but this occurred before the concussion. Last a few seconds. Adult hypothyroidism From 06/16/2018-labs checked showing-stable.  Hyperlipidemia, unspecified hyperlipidemia type From 06/16/2018-labs checked showing-stable.  Major depression, single episode, in complete remission ([place]) Consider stopping sertraline on next visit.PHQ9 is 1.  Well controlled diabetes mellitus ([place]) From 12/15/2018-Hemoglobin A1c 6.9.  Home blood sugars averaging 120-200.   Essential (primary) hypertension From 06/16/2018-labs checked showing-stable.  CAD From 06/16/2018-All risk factors treated.   Allergies  Allergen Reactions  . Enalapril Swelling    Lip swelling     Current Outpatient Medications:  .  aspirin 81 MG tablet, Take by mouth., Disp: , Rfl:  .  atorvastatin (LIPITOR) 20 MG tablet, Take 1 tablet (20 mg total) by mouth daily at 6 PM., Disp: 90 tablet, Rfl: 3 .  cholecalciferol (VITAMIN D) 1000 UNITS tablet, Take by mouth., Disp: , Rfl:  .  Coenzyme Q10 100 MG TABS, Take by mouth., Disp: , Rfl:  .  DILT-XR 240 MG 24 hr capsule, TAKE 1 CAPSULE(240 MG) BY MOUTH DAILY, Disp: 90 capsule, Rfl: 3 .  Esomeprazole Magnesium (NEXIUM PO), Take by mouth., Disp: , Rfl:  .  ezetimibe (ZETIA) 10 MG tablet, Take 1 tablet (10 mg total) by mouth daily., Disp: 90 tablet, Rfl: 3 .  fluticasone (FLONASE) 50 MCG/ACT nasal spray, Place into the nose. Reported on 08/14/2015, Disp: , Rfl:  .   lansoprazole (PREVACID 24HR) 15 MG capsule, Take by mouth., Disp: , Rfl:  .  levothyroxine (SYNTHROID) 75 MCG tablet, TAKE 1 TABLET BY MOUTH  DAILY BEFORE BREAKFAST, Disp: 90 tablet, Rfl: 3 .  metFORMIN (GLUCOPHAGE) 1000 MG tablet, TAKE 1 TABLET BY MOUTH 2  TIMES DAILY WITH A MEAL, Disp: 180 tablet, Rfl: 3 .  metoprolol succinate (TOPROL-XL) 100 MG 24 hr tablet, TAKE 1 TABLET BY MOUTH  DAILY WITH OR IMMEDIATELY  FOLLOWING A MEAL., Disp: 90 tablet, Rfl: 3 .  nitroGLYCERIN (NITROSTAT) 0.4 MG SL tablet, Place 1 tablet (0.4 mg total) under the tongue every 5 (five) minutes as needed for chest pain., Disp: 100 tablet, Rfl: 5 .  ONETOUCH ULTRA test strip, CHECK BLOOD SUGAR ONCE  DAILY, Disp: 100 strip, Rfl: 3 .  pyridOXINE (VITAMIN B-6) 100 MG tablet, Take 100 mg by mouth daily., Disp: , Rfl:  .  Saw Palmetto 160 MG TABS, Take by mouth., Disp: , Rfl:  .  sertraline (ZOLOFT) 100 MG tablet, Take 1 tablet (100 mg total) by mouth daily., Disp: 90 tablet, Rfl: 3 .  temazepam (RESTORIL) 30 MG capsule, Take 1 capsule (30 mg total) by mouth at bedtime., Disp: 90 capsule, Rfl: 1 .  montelukast (SINGULAIR) 10 MG tablet, Take 1 tablet (10 mg total) by mouth at bedtime. (Patient not taking: Reported on 01/19/2018), Disp: 90 tablet, Rfl: 3 .  ondansetron (ZOFRAN ODT) 8 MG disintegrating tablet, Take by mouth. Reported on 04/25/2015, Disp: , Rfl:   Review of  Systems  Constitutional: Negative for appetite change, chills and fever.  HENT: Negative.   Eyes: Negative.   Respiratory: Negative for chest tightness, shortness of breath and wheezing.   Cardiovascular: Negative for chest pain and palpitations.  Gastrointestinal: Negative for abdominal pain, nausea and vomiting.  Endocrine: Negative.   Musculoskeletal: Negative.   Allergic/Immunologic: Negative.   Neurological: Negative.   Hematological: Negative.   Psychiatric/Behavioral: Negative.     Social History   Tobacco Use  . Smoking status: Never Smoker  .  Smokeless tobacco: Never Used  Substance Use Topics  . Alcohol use: Yes    Comment: 2-3 beer on the weekends      Objective:   BP 107/73 (BP Location: Right Arm, Patient Position: Sitting, Cuff Size: Large)   Pulse 77   Temp (!) 96.8 F (36 C) (Other (Comment))   Resp 18   Ht 5\' 10"  (1.778 m)   Wt 183 lb (83 kg)   SpO2 97%   BMI 26.26 kg/m  Vitals:   04/14/19 0859  BP: 107/73  Pulse: 77  Resp: 18  Temp: (!) 96.8 F (36 C)  TempSrc: Other (Comment)  SpO2: 97%  Weight: 183 lb (83 kg)  Height: 5\' 10"  (1.778 m)  Body mass index is 26.26 kg/m.   Physical Exam Vitals reviewed.  Constitutional:      Appearance: He is well-developed.  HENT:     Head: Normocephalic and atraumatic.     Right Ear: External ear normal.     Left Ear: External ear normal.     Nose: Nose normal.  Eyes:     General: No scleral icterus.    Conjunctiva/sclera: Conjunctivae normal.  Neck:     Thyroid: No thyromegaly.  Cardiovascular:     Rate and Rhythm: Normal rate and regular rhythm.     Heart sounds: Normal heart sounds.  Pulmonary:     Effort: Pulmonary effort is normal.  Abdominal:     Palpations: Abdomen is soft.  Musculoskeletal:     Right lower leg: No edema.     Left lower leg: No edema.  Skin:    General: Skin is warm and dry.  Neurological:     General: No focal deficit present.     Mental Status: He is alert and oriented to person, place, and time.  Psychiatric:        Mood and Affect: Mood normal.        Behavior: Behavior normal.        Thought Content: Thought content normal.        Judgment: Judgment normal.      No results found for any visits on 04/14/19.     Assessment & Plan    1. Well controlled diabetes mellitus ([place]) A1c is 7.1.  No changes in care.  He states [place] is in to be more [place] - POCT glycosylated hemoglobin (Hb A1C) - CBC w/Diff/Platelet - Comprehensive Metabolic Panel (CMET) - Lipid panel - TSH -  metFORMIN (GLUCOPHAGE) 1000 MG tablet; TAKE 1 TABLET BY MOUTH 2  TIMES DAILY WITH A MEAL  Dispense: 180 tablet; Refill: 3  2. Essential (primary) hypertension Stop diltiazem.  I think he might be getting a little postural hypotension at times.  - CBC w/Diff/Platelet - Comprehensive Metabolic Panel (CMET) - Lipid panel - TSH - metoprolol succinate (TOPROL-XL) 100 MG 24 hr tablet; TAKE 1 TABLET BY MOUTH  DAILY WITH OR IMMEDIATELY  FOLLOWING A  MEAL.  Dispense: 90 tablet; Refill: 3  3. Hyperlipidemia, unspecified hyperlipidemia type On atorvastatin and Zetia. - CBC w/Diff/Platelet - Comprehensive Metabolic Panel (CMET) - Lipid panel - TSH - atorvastatin (LIPITOR) 20 MG tablet; Take 1 tablet (20 mg total) by mouth daily at 6 PM.  Dispense: 90 tablet; Refill: 3 - ezetimibe (ZETIA) 10 MG tablet; Take 1 tablet (10 mg total) by mouth daily.  Dispense: 90 tablet; Refill: 3  4. Adult hypothyroidism  - CBC w/Diff/Platelet - Comprehensive Metabolic Panel (CMET) - Lipid panel - TSH - levothyroxine (SYNTHROID) 75 MCG tablet; TAKE 1 TABLET BY MOUTH  DAILY BEFORE BREAKFAST  Dispense: 90 tablet; Refill: 3  5. Coronary artery disease involving native coronary artery of native heart without angina pectoris All risk factors treated. - CBC w/Diff/Platelet - Comprehensive Metabolic Panel (CMET) - Lipid panel - TSH  6. Major depression, single episode, in complete remission ([place]) Consider weaning sertraline in the future.  7. Other insomnia    Follow up in 3-4 months.    I,Antrell Tipler,acting as a scribe for [name], MD.,have documented all relevant documentation on the behalf of [name], MD,as directed by  [name], MD while in the presence of [name], MD.      [name], MD  [facility]

## 2019-04-14 ENCOUNTER — Other Ambulatory Visit: Payer: Self-pay | Admitting: Family Medicine

## 2019-04-14 ENCOUNTER — Encounter: Payer: Self-pay | Admitting: Family Medicine

## 2019-04-14 ENCOUNTER — Ambulatory Visit (INDEPENDENT_AMBULATORY_CARE_PROVIDER_SITE_OTHER): Payer: PRIVATE HEALTH INSURANCE | Admitting: Family Medicine

## 2019-04-14 ENCOUNTER — Other Ambulatory Visit: Payer: Self-pay

## 2019-04-14 ENCOUNTER — Other Ambulatory Visit: Payer: Self-pay | Admitting: *Deleted

## 2019-04-14 VITALS — BP 107/73 | HR 77 | Temp 96.8°F | Resp 18 | Ht 70.0 in | Wt 183.0 lb

## 2019-04-14 DIAGNOSIS — I10 Essential (primary) hypertension: Secondary | ICD-10-CM | POA: Diagnosis not present

## 2019-04-14 DIAGNOSIS — F325 Major depressive disorder, single episode, in full remission: Secondary | ICD-10-CM

## 2019-04-14 DIAGNOSIS — I2510 Atherosclerotic heart disease of native coronary artery without angina pectoris: Secondary | ICD-10-CM

## 2019-04-14 DIAGNOSIS — G4709 Other insomnia: Secondary | ICD-10-CM

## 2019-04-14 DIAGNOSIS — E039 Hypothyroidism, unspecified: Secondary | ICD-10-CM | POA: Diagnosis not present

## 2019-04-14 DIAGNOSIS — E119 Type 2 diabetes mellitus without complications: Secondary | ICD-10-CM

## 2019-04-14 DIAGNOSIS — E785 Hyperlipidemia, unspecified: Secondary | ICD-10-CM | POA: Diagnosis not present

## 2019-04-14 DIAGNOSIS — Z20822 Contact with and (suspected) exposure to covid-19: Secondary | ICD-10-CM

## 2019-04-14 LAB — POCT GLYCOSYLATED HEMOGLOBIN (HGB A1C)
Est. average glucose Bld gHb Est-mCnc: 157
Hemoglobin A1C: 7.1 % — AB (ref 4.0–5.6)

## 2019-04-14 MED ORDER — EZETIMIBE 10 MG PO TABS: 10.0000 mg | ORAL_TABLET | Freq: Every day | ORAL | 3 refills | Status: DC

## 2019-04-14 MED ORDER — LEVOTHYROXINE SODIUM 75 MCG PO TABS: ORAL_TABLET | ORAL | 3 refills | Status: DC

## 2019-04-14 MED ORDER — TEMAZEPAM 30 MG PO CAPS: 30.0000 mg | ORAL_CAPSULE | Freq: Every day | ORAL | 1 refills | Status: DC

## 2019-04-14 MED ORDER — SERTRALINE HCL 100 MG PO TABS: 100.0000 mg | ORAL_TABLET | Freq: Every day | ORAL | 3 refills | Status: DC

## 2019-04-14 MED ORDER — METFORMIN HCL 1000 MG PO TABS: ORAL_TABLET | ORAL | 3 refills | Status: DC

## 2019-04-14 MED ORDER — METOPROLOL SUCCINATE ER 100 MG PO TB24: ORAL_TABLET | ORAL | 3 refills | Status: DC

## 2019-04-14 MED ORDER — ATORVASTATIN CALCIUM 20 MG PO TABS: 20.0000 mg | ORAL_TABLET | Freq: Every day | ORAL | 3 refills | Status: DC

## 2019-04-14 NOTE — Patient Instructions (Signed)
Stop diltiazem. Follow up in 3-4 months.

## 2019-04-15 ENCOUNTER — Telehealth: Payer: Self-pay

## 2019-04-15 LAB — CBC WITH DIFFERENTIAL/PLATELET
Basophils Absolute: 0.1 10*3/uL (ref 0.0–0.2)
Basos: 1 %
EOS (ABSOLUTE): 0.2 10*3/uL (ref 0.0–0.4)
Eos: 3 %
Hematocrit: 40.9 % (ref 37.5–51.0)
Hemoglobin: 13.2 g/dL (ref 13.0–17.7)
Immature Grans (Abs): 0 10*3/uL (ref 0.0–0.1)
Immature Granulocytes: 0 %
Lymphocytes Absolute: 1.8 10*3/uL (ref 0.7–3.1)
Lymphs: 26 %
MCH: 27.4 pg (ref 26.6–33.0)
MCHC: 32.3 g/dL (ref 31.5–35.7)
MCV: 85 fL (ref 79–97)
Monocytes Absolute: 0.6 10*3/uL (ref 0.1–0.9)
Monocytes: 9 %
Neutrophils Absolute: 4.3 10*3/uL (ref 1.4–7.0)
Neutrophils: 61 %
Platelets: 222 10*3/uL (ref 150–450)
RBC: 4.81 x10E6/uL (ref 4.14–5.80)
RDW: 13.8 % (ref 11.6–15.4)
WBC: 7.1 10*3/uL (ref 3.4–10.8)

## 2019-04-15 LAB — COMPREHENSIVE METABOLIC PANEL
ALT: 34 IU/L (ref 0–44)
AST: 27 IU/L (ref 0–40)
Albumin/Globulin Ratio: 1.7 (ref 1.2–2.2)
Albumin: 4.5 g/dL (ref 3.8–4.8)
Alkaline Phosphatase: 86 IU/L (ref 39–117)
BUN/Creatinine Ratio: 13 (ref 10–24)
BUN: 13 mg/dL (ref 8–27)
Bilirubin Total: 0.4 mg/dL (ref 0.0–1.2)
CO2: 23 mmol/L (ref 20–29)
Calcium: 10.1 mg/dL (ref 8.6–10.2)
Chloride: 102 mmol/L (ref 96–106)
Creatinine, Ser: 0.97 mg/dL (ref 0.76–1.27)
GFR calc Af Amer: 96 mL/min/{1.73_m2} (ref >59)
GFR calc non Af Amer: 83 mL/min/{1.73_m2} (ref >59)
Globulin, Total: 2.6 g/dL (ref 1.5–4.5)
Glucose: 161 mg/dL — ABNORMAL HIGH (ref 65–99)
Potassium: 4.7 mmol/L (ref 3.5–5.2)
Sodium: 138 mmol/L (ref 134–144)
Total Protein: 7.1 g/dL (ref 6.0–8.5)

## 2019-04-15 LAB — TSH: TSH: 4.96 u[IU]/mL — ABNORMAL HIGH (ref 0.450–4.500)

## 2019-04-15 LAB — SAR COV2 SEROLOGY (COVID19)AB(IGG),IA: DiaSorin SARS-CoV-2 Ab, IgG: NEGATIVE

## 2019-04-15 NOTE — Telephone Encounter (Signed)
Patient advised.

## 2019-04-15 NOTE — Telephone Encounter (Signed)
-----   Message from Jerrol Banana., MD sent at 04/15/2019  8:20 AM EDT ----- Labs stable.  No Covid antibodies/exposure.

## 2019-05-26 ENCOUNTER — Telehealth: Payer: Self-pay | Admitting: Endocrinology

## 2019-05-31 ENCOUNTER — Other Ambulatory Visit: Payer: Self-pay | Admitting: Family Medicine

## 2019-05-31 DIAGNOSIS — G4709 Other insomnia: Secondary | ICD-10-CM

## 2019-05-31 MED ORDER — TEMAZEPAM 30 MG PO CAPS: 30.0000 mg | ORAL_CAPSULE | Freq: Every day | ORAL | 1 refills | Status: DC

## 2019-05-31 NOTE — Telephone Encounter (Signed)
Requested medication (s) are due for refill today: yes  Requested medication (s) are on the active medication list: yes  Last refill:  04/14/2019  Future visit scheduled: yes  Notes to clinic:  this refill cannot be delegated    Requested Prescriptions  Pending Prescriptions Disp Refills   temazepam (RESTORIL) 30 MG capsule 90 capsule 1    Sig: Take 1 capsule (30 mg total) by mouth at bedtime.      Not Delegated - Psychiatry:  Anxiolytics/Hypnotics Failed - 05/31/2019  8:38 AM      Failed - This refill cannot be delegated      Failed - Urine Drug Screen completed in last 360 days.      Passed - Valid encounter within last 6 months    Recent Outpatient Visits           1 month ago Well controlled diabetes mellitus [facility])   [facility] Jerrol Banana., MD   5 months ago Essential (primary) hypertension   [facility] Jerrol Banana., MD   11 months ago Coronary artery disease involving native coronary artery of native heart without angina pectoris   [facility] Jerrol Banana., MD   1 year ago URI with cough and congestion   [place], [place]ickki Muff, [place]   1 year ago Essential (primary) hypertension   [facility] Jerrol Banana., MD       Future Appointments             In 2 months Jerrol Banana., MD [facility], [place]

## 2019-05-31 NOTE — Telephone Encounter (Signed)
Copied from [place] [id number]. Topic: Quick Communication - Rx Refill/Question >> May 31, 2019  8:32 AM [name] [name] wrote: Medication: temazepam (RESTORIL) 30 MG capsule  Has the patient contacted their pharmacy?Yes (Agent: If no, request that the patient contact the pharmacy for the refill.) (Agent: If yes, when and what did the pharmacy advise?)Contact PCP  Preferred Pharmacy (with phone number or street name): [facility] [name], [place]  Phone:  [phone] Fax:  [phone]     Agent: Please be advised that RX refills may take up to 3 business days. We ask that you follow-up with your pharmacy.

## 2019-06-23 ENCOUNTER — Other Ambulatory Visit: Payer: Self-pay | Admitting: Family Medicine

## 2019-06-23 DIAGNOSIS — E039 Hypothyroidism, unspecified: Secondary | ICD-10-CM

## 2019-06-23 DIAGNOSIS — E785 Hyperlipidemia, unspecified: Secondary | ICD-10-CM

## 2019-06-23 DIAGNOSIS — I10 Essential (primary) hypertension: Secondary | ICD-10-CM

## 2019-06-23 DIAGNOSIS — E119 Type 2 diabetes mellitus without complications: Secondary | ICD-10-CM

## 2019-06-23 DIAGNOSIS — F325 Major depressive disorder, single episode, in full remission: Secondary | ICD-10-CM

## 2019-08-13 NOTE — Progress Notes (Signed)
Trena Platt Annalisse Minkoff,acting as a scribe for [name], MD.,have documented all relevant documentation on the behalf of [name], MD,as directed by  [name], MD while in the presence of [name], MD.  Established patient visit   Patient: [name] [name]   DOB: Jul 01, 1955   64 y.o. Male  MRN: [id number] Visit Date: 08/17/2019  Today's healthcare provider: [name], MD   Chief Complaint  Patient presents with  . Depression  . Diabetes  . Hypertension   Subjective    HPI  Patient having no problems with his Metformin.  He does not have the energy that he used to have but otherwise has no complaints.  Certainly no cardiac complaints.  No chest pain or shortness of breath. Diabetes Mellitus Type II, follow-up  Lab Results  Component Value Date   HGBA1C 7.1 (A) 04/14/2019   HGBA1C 6.9 (A) 12/15/2018   HGBA1C 6.6 (H) 06/16/2018   Last seen for diabetes 4 months ago.  Management since then includes continuing the same treatment. He reports excellent compliance with treatment. He is not having side effects.   Home blood sugar records: fasting range: 154  Episodes of hypoglycemia? No    Current insulin regiment:  Most Recent Eye Exam: Oct. 2020  --------------------------------------------------------------------------------------------------- Hypertension, follow-up  BP Readings from Last 3 Encounters:  08/17/19 130/86  04/14/19 107/73  12/15/18 130/80   Wt Readings from Last 3 Encounters:  08/17/19 184 lb (83.5 kg)  04/14/19 183 lb (83 kg)  12/15/18 184 lb (83.5 kg)     He was last seen for hypertension 4 months ago.  BP at that visit was 107/73. Management since that visit includes; Stop diltiazem. I think he might be getting a little postural hypotension at times. He reports excellent compliance with treatment. He is not having side effects.  He is exercising. He is adherent to low salt diet.   Outside blood  pressures are 130/72.  He does not smoke.  Use of agents associated with hypertension: none.   ---------------------------------------------------------------------------------------------------  Major depression, single episode, in complete remission ([place]) From 04/14/2019-Consider weaning sertraline in the future.   Past Surgical History:  Procedure Laterality Date  . CARDIAC SURGERY  05/30/05   stent placement   Social History   Tobacco Use  . Smoking status: Never Smoker  . Smokeless tobacco: Never Used  Substance Use Topics  . Alcohol use: Yes    Comment: 2-3 beer on the weekends  . Drug use: No       Medications: Outpatient Medications Prior to Visit  Medication Sig  . aspirin 81 MG tablet Take by mouth.  Marland Kitchen atorvastatin (LIPITOR) 20 MG tablet TAKE 1 TABLET BY MOUTH  DAILY AT 6 PM.  . cholecalciferol (VITAMIN D) 1000 UNITS tablet Take by mouth.  . Coenzyme Q10 100 MG TABS Take by mouth.  Marland Kitchen DILT-XR 240 MG 24 hr capsule TAKE 1 CAPSULE(240 MG) BY MOUTH DAILY  . Esomeprazole Magnesium (NEXIUM PO) Take by mouth.  . ezetimibe (ZETIA) 10 MG tablet TAKE 1 TABLET BY MOUTH  DAILY  . fluticasone (FLONASE) 50 MCG/ACT nasal spray Place into the nose. Reported on 08/14/2015  . lansoprazole (PREVACID 24HR) 15 MG capsule Take by mouth.  . levothyroxine (SYNTHROID) 75 MCG tablet TAKE 1 TABLET BY MOUTH  DAILY BEFORE BREAKFAST  . metFORMIN (GLUCOPHAGE) 1000 MG tablet TAKE 1 TABLET BY MOUTH 2  TIMES DAILY WITH A MEAL  . metoprolol succinate (TOPROL-XL) 100  MG 24 hr tablet TAKE 1 TABLET BY MOUTH  DAILY WITH OR IMMEDIATELY  FOLLOWING A MEAL.  . montelukast (SINGULAIR) 10 MG tablet Take 1 tablet (10 mg total) by mouth at bedtime.  . nitroGLYCERIN (NITROSTAT) 0.4 MG SL tablet Place 1 tablet (0.4 mg total) under the tongue every 5 (five) minutes as needed for chest pain.  Marland Kitchen ondansetron (ZOFRAN ODT) 8 MG disintegrating tablet Take by mouth. Reported on 04/25/2015  . ONETOUCH ULTRA test strip  CHECK BLOOD SUGAR ONCE  DAILY  . pyridOXINE (VITAMIN B-6) 100 MG tablet Take 100 mg by mouth daily.  . Saw Palmetto 160 MG TABS Take by mouth.  . sertraline (ZOLOFT) 100 MG tablet TAKE 1 TABLET BY MOUTH  DAILY  . temazepam (RESTORIL) 30 MG capsule Take 1 capsule (30 mg total) by mouth at bedtime.   No facility-administered medications prior to visit.    Review of Systems  Constitutional: Negative for appetite change, chills and fever.  Respiratory: Negative for chest tightness, shortness of breath and wheezing.   Cardiovascular: Negative for chest pain and palpitations.  Gastrointestinal: Negative for abdominal pain, nausea and vomiting.    Last hemoglobin A1c Lab Results  Component Value Date   HGBA1C 7.7 (A) 08/17/2019      Objective    BP 130/86 (BP Location: Right Arm, Patient Position: Sitting, Cuff Size: Normal)   Pulse 80   Temp (!) 97.1 F (36.2 C) (Temporal)   Ht 5\' 10"  (1.778 m)   Wt 184 lb (83.5 kg)   BMI 26.40 kg/m  BP Readings from Last 3 Encounters:  08/17/19 130/86  04/14/19 107/73  12/15/18 130/80   Wt Readings from Last 3 Encounters:  08/17/19 184 lb (83.5 kg)  04/14/19 183 lb (83 kg)  12/15/18 184 lb (83.5 kg)      Physical Exam Vitals reviewed.  Constitutional:      Appearance: Normal appearance.  HENT:     Head: Normocephalic and atraumatic.     Right Ear: External ear normal.     Left Ear: External ear normal.  Eyes:     General: No scleral icterus.    Conjunctiva/sclera: Conjunctivae normal.  Cardiovascular:     Rate and Rhythm: Normal rate and regular rhythm.     Pulses: Normal pulses.     Heart sounds: Normal heart sounds.  Pulmonary:     Effort: Pulmonary effort is normal.     Breath sounds: Normal breath sounds.  Musculoskeletal:     Right lower leg: No edema.     Left lower leg: No edema.  Skin:    General: Skin is warm and dry.  Neurological:     General: No focal deficit present.     Mental Status: He is alert and  oriented to person, place, and time.  Psychiatric:        Mood and Affect: Mood normal.        Behavior: Behavior normal.        Thought Content: Thought content normal.        Judgment: Judgment normal.       No results found for any visits on 08/17/19.  Assessment & Plan     1. Type 2 diabetes mellitus with other circulatory complication, without long-term current use of insulin (HCC) A1c 7.7 today which continues to trend upward.  At this time add pioglitazone 15 mg daily.  He is going to check with the pharmacist with how much Vania Rea would cost through his insurance.  Return to clinic 3 to 4 months.  2. Essential (primary) hypertension Controlled. 3. Major depression, single episode, in complete remission ([place]) Good control.  4. Well controlled diabetes mellitus ([place])  - POCT HgB A1C - pioglitazone (ACTOS) 15 MG tablet; Take 1 tablet (15 mg total) by mouth daily.  Dispense: 30 tablet; Refill: 12  5. Coronary artery disease involving native coronary artery of native heart without angina pectoris All risk factors treated.  Consider adding Jardiance.  6. Hyperlipidemia, unspecified hyperlipidemia type On stati  7. Adult hypothyroidism Follow-up TSH when appropriate.  No follow-ups on file.      I, [name], MD, have reviewed all documentation for this visit. The documentation on 08/17/19 for the exam, diagnosis, procedures, and orders are all accurate and complete.    Richard Cranford Mon, MD  [facility] [phone] (phone) [phone] (fax)  [place]

## 2019-08-17 ENCOUNTER — Ambulatory Visit (INDEPENDENT_AMBULATORY_CARE_PROVIDER_SITE_OTHER): Payer: PRIVATE HEALTH INSURANCE | Admitting: Family Medicine

## 2019-08-17 ENCOUNTER — Encounter: Payer: Self-pay | Admitting: Family Medicine

## 2019-08-17 ENCOUNTER — Other Ambulatory Visit: Payer: Self-pay

## 2019-08-17 VITALS — BP 130/86 | HR 80 | Temp 97.1°F | Ht 70.0 in | Wt 184.0 lb

## 2019-08-17 DIAGNOSIS — E1159 Type 2 diabetes mellitus with other circulatory complications: Secondary | ICD-10-CM | POA: Diagnosis not present

## 2019-08-17 DIAGNOSIS — I10 Essential (primary) hypertension: Secondary | ICD-10-CM

## 2019-08-17 DIAGNOSIS — F325 Major depressive disorder, single episode, in full remission: Secondary | ICD-10-CM | POA: Diagnosis not present

## 2019-08-17 DIAGNOSIS — I2510 Atherosclerotic heart disease of native coronary artery without angina pectoris: Secondary | ICD-10-CM

## 2019-08-17 DIAGNOSIS — E119 Type 2 diabetes mellitus without complications: Secondary | ICD-10-CM

## 2019-08-17 DIAGNOSIS — Z8679 Personal history of other diseases of the circulatory system: Secondary | ICD-10-CM | POA: Insufficient documentation

## 2019-08-17 DIAGNOSIS — E039 Hypothyroidism, unspecified: Secondary | ICD-10-CM

## 2019-08-17 DIAGNOSIS — I4891 Unspecified atrial fibrillation: Secondary | ICD-10-CM

## 2019-08-17 DIAGNOSIS — E785 Hyperlipidemia, unspecified: Secondary | ICD-10-CM

## 2019-08-17 LAB — POCT GLYCOSYLATED HEMOGLOBIN (HGB A1C)
Estimated Average Glucose: 174
Hemoglobin A1C: 7.7 % — AB (ref 4.0–5.6)

## 2019-08-17 MED ORDER — PIOGLITAZONE HCL 15 MG PO TABS: 15.0000 mg | ORAL_TABLET | Freq: Every day | ORAL | 12 refills | Status: DC

## 2019-09-16 ENCOUNTER — Other Ambulatory Visit: Payer: Self-pay | Admitting: Family Medicine

## 2019-09-16 DIAGNOSIS — E119 Type 2 diabetes mellitus without complications: Secondary | ICD-10-CM

## 2019-09-16 DIAGNOSIS — I10 Essential (primary) hypertension: Secondary | ICD-10-CM

## 2019-09-16 DIAGNOSIS — E785 Hyperlipidemia, unspecified: Secondary | ICD-10-CM

## 2019-09-16 DIAGNOSIS — E039 Hypothyroidism, unspecified: Secondary | ICD-10-CM

## 2019-09-16 DIAGNOSIS — F325 Major depressive disorder, single episode, in full remission: Secondary | ICD-10-CM

## 2019-09-22 ENCOUNTER — Ambulatory Visit: Payer: Self-pay | Admitting: Family Medicine

## 2019-09-22 DIAGNOSIS — E119 Type 2 diabetes mellitus without complications: Secondary | ICD-10-CM

## 2019-09-22 MED ORDER — PIOGLITAZONE HCL 30 MG PO TABS: 30.0000 mg | ORAL_TABLET | Freq: Every day | ORAL | 12 refills | Status: DC

## 2019-09-22 NOTE — Addendum Note (Signed)
Addended by: Gerald Stabs on: 09/22/2019 01:34 PM   Modules accepted: Orders

## 2019-09-22 NOTE — Telephone Encounter (Signed)
Ret'd. Call to pt.  Stated he started Pioglitazone about a month ago.  Reported his blood sugars were in the 130 range, fasting, and now he can't get it under 160.  Reported blood sugar this AM was "258".  Stated it had been 178-188 the previous 3 mornings.  Reported his bedtime BS's are 175-196.  C/o feeling tired, and this has increased over past 4-6 weeks.  Reported has intermittent headaches.  Denied any blurred vision, nausea/ vomiting, or change in concentration.  Taking the Metformin and Pioglitazone as prescribed.  Works outside in [employer].  Advised on importance of rehydrating in the heat, especially with his diabetes.  Care advice given per protocol.  Verb. Understanding.  Will send note to PCP for review/ further recommendations.  Pt. Encouraged to call back if blood sugars continue to be > 240.  Agreed with plan.     Reason for Disposition . [1] Blood glucose 240 - 300 mg/dL (13.3 - 16.7 mmol/L) AND [2] does not  use insulin (e.g., not insulin-dependent; most people with type 2 diabetes)  Answer Assessment - Initial Assessment Questions 1. BLOOD GLUCOSE: "What is your blood glucose level?"      258 this morning  2. ONSET: "When did you check the blood glucose?"     6:00-7:00 AM  3. USUAL RANGE: "What is your glucose level usually?" (e.g., usual fasting morning value, usual evening value)     Fasting readings has been 178-188 the past 3 days; today it was 258.  Bedtime readings: 170's - 180's and on 8/24 196 4. KETONES: "Do you check for ketones (urine or blood test strips)?" If yes, ask: "What does the test show now?"      No/ denies 5. TYPE 1 or 2:  "Do you know what type of diabetes you have?"  (e.g., Type 1, Type 2, Gestational; doesn't know)      Type 2  6. INSULIN: "Do you take insulin?" "What type of insulin(s) do you use? What is the mode of delivery? (syringe, pen; injection or pump)?"      n/a 7. DIABETES PILLS: "Do you take any pills for your diabetes?" If yes, ask:  "Have you missed taking any pills recently?"    Has not missed any doses  8. OTHER SYMPTOMS: "Do you have any symptoms?" (e.g., fever, frequent urination, difficulty breathing, dizziness, weakness, vomiting)     Feels very tired.  Is working in the heat, doing Architect.  "I don't have the energy that I did have 6 mos. Ago, and it is worse the past 4-6 weeks." 9. PREGNANCY: "Is there any chance you are pregnant?" "When was your last menstrual period?"    N/a  Protocols used: DIABETES - HIGH BLOOD SUGAR-A-AH  Message from Celene Kras sent at 09/22/2019 8:41 AM EDT  Pts wife called this morning stating that the pt has had hight Blood sugar. She states that the pt has blood sugar of 285 this morning. She states that pt is experiencing fatigue as well. Please advise.

## 2019-09-22 NOTE — Telephone Encounter (Signed)
Patient advised and refill sent to patient's pharmacy.

## 2019-09-22 NOTE — Telephone Encounter (Signed)
Increase pioglitazone from 15 to 30 mg.  We may have to change to a different medication if fasting blood sugars get above 200 and stay above 200.

## 2019-10-28 ENCOUNTER — Other Ambulatory Visit: Payer: Self-pay | Admitting: Family Medicine

## 2019-10-28 DIAGNOSIS — E119 Type 2 diabetes mellitus without complications: Secondary | ICD-10-CM

## 2019-11-02 ENCOUNTER — Other Ambulatory Visit: Payer: Self-pay | Admitting: Family Medicine

## 2019-11-02 DIAGNOSIS — E119 Type 2 diabetes mellitus without complications: Secondary | ICD-10-CM

## 2019-11-03 LAB — HM DIABETES EYE EXAM

## 2019-11-13 ENCOUNTER — Other Ambulatory Visit: Payer: Self-pay | Admitting: Family Medicine

## 2019-11-13 DIAGNOSIS — E119 Type 2 diabetes mellitus without complications: Secondary | ICD-10-CM

## 2019-11-13 NOTE — Telephone Encounter (Signed)
Requested medications are due for refill today?   Yes  - patient needs an alternative as these OneTouch Ultra in Vitro Strips are not on formulary for the patient's insurance plan.  Please review.    Requested medications are on active medication list?  Yes  Last Refill:   10/29/2019  # 100 with 3 refills   Future visit scheduled?  Yes  Notes to Clinic:  Please see below.     Why Am I Seeing These Alternatives?   ONETOUCH ULTRA test strip [Pharmacy Med Name: OneTouch Ultra In Vitro Strip] is not on the preferred formulary for the patient's insurance plan. Below are alternatives which are likely to be more affordable. Do not assume that every medication presented is a clinically appropriate alternative.  These alternatives were suggested by the patient's pharmacy benefit manager.

## 2019-11-22 ENCOUNTER — Other Ambulatory Visit: Payer: Self-pay | Admitting: Family Medicine

## 2019-11-22 DIAGNOSIS — G4709 Other insomnia: Secondary | ICD-10-CM

## 2019-11-22 NOTE — Telephone Encounter (Signed)
Requested medication (s) are due for refill today: no early   Requested medication (s) are on the active medication list: yes   Last refill:  05/31/19 #90 1 refill   Future visit scheduled: yes   Notes to clinic:  not delegated per protocol     Requested Prescriptions  Pending Prescriptions Disp Refills   temazepam (RESTORIL) 30 MG capsule 90 capsule 1    Sig: Take 1 capsule (30 mg total) by mouth at bedtime.      Not Delegated - Psychiatry:  Anxiolytics/Hypnotics Failed - 11/22/2019  1:11 PM      Failed - This refill cannot be delegated      Failed - Urine Drug Screen completed in last 360 days.      Passed - Valid encounter within last 6 months    Recent Outpatient Visits           3 months ago Type 2 diabetes mellitus with other circulatory complication, without long-term current use of insulin [facility])   [facility] Jerrol Banana., MD   7 months ago Well controlled diabetes mellitus [facility])   [facility] Jerrol Banana., MD   11 months ago Essential (primary) hypertension   [facility] Rosanna Randy, Retia Passe., MD   1 year ago Coronary artery disease involving native coronary artery of native heart without angina pectoris   [facility] Jerrol Banana., MD   1 year ago URI with cough and congestion   [place], [place]       Future Appointments             In 4 weeks Jerrol Banana., MD [facility]

## 2019-11-22 NOTE — Telephone Encounter (Signed)
PT need a refill  temazepam (RESTORIL) 30 MG capsule [[id number]]  [id number] - [name], [place] [address] AT [facility] [address]  [place] [address]  Phone: [phone] Fax: [phone]

## 2019-11-23 MED ORDER — TEMAZEPAM 30 MG PO CAPS: 30.0000 mg | ORAL_CAPSULE | Freq: Every day | ORAL | 1 refills | Status: DC

## 2019-12-13 ENCOUNTER — Other Ambulatory Visit: Payer: Self-pay

## 2019-12-13 ENCOUNTER — Encounter: Payer: Self-pay | Admitting: Family Medicine

## 2019-12-13 ENCOUNTER — Ambulatory Visit (INDEPENDENT_AMBULATORY_CARE_PROVIDER_SITE_OTHER): Payer: PRIVATE HEALTH INSURANCE | Admitting: Family Medicine

## 2019-12-13 VITALS — BP 131/83 | HR 89 | Temp 98.2°F | Resp 16 | Ht 70.0 in | Wt 192.0 lb

## 2019-12-13 DIAGNOSIS — F325 Major depressive disorder, single episode, in full remission: Secondary | ICD-10-CM

## 2019-12-13 DIAGNOSIS — I10 Essential (primary) hypertension: Secondary | ICD-10-CM

## 2019-12-13 DIAGNOSIS — E1159 Type 2 diabetes mellitus with other circulatory complications: Secondary | ICD-10-CM

## 2019-12-13 DIAGNOSIS — I2510 Atherosclerotic heart disease of native coronary artery without angina pectoris: Secondary | ICD-10-CM

## 2019-12-13 DIAGNOSIS — E039 Hypothyroidism, unspecified: Secondary | ICD-10-CM

## 2019-12-13 LAB — POCT GLYCOSYLATED HEMOGLOBIN (HGB A1C)
Est. average glucose Bld gHb Est-mCnc: 160
Hemoglobin A1C: 7.2 % — AB (ref 4.0–5.6)

## 2019-12-13 MED ORDER — SERTRALINE HCL 100 MG PO TABS: 100.0000 mg | ORAL_TABLET | Freq: Every day | ORAL | 1 refills | Status: DC

## 2019-12-13 NOTE — Progress Notes (Signed)
I,[name],acting as a scribe for [name], MD.,have documented all relevant documentation on the behalf of [name], MD,as directed by  [name], MD while in the presence of [name], MD.   Established patient visit   Patient: [name] [name]   DOB: [birth year]   64 y.o. Male  MRN: [id number] Visit Date: 12/13/2019  Today's healthcare provider: [name], MD   Chief Complaint  Patient presents with  . Depression  . Diabetes  . Follow-up  . Hypertension   Subjective    HPI  Overall patient is feeling well and works full-time.  He has no complaints.  No chest pain.  He is taking his medications. Diabetes Mellitus Type II, follow-up  Lab Results  Component Value Date   HGBA1C 7.7 (A) 08/17/2019   HGBA1C 7.1 (A) 04/14/2019   HGBA1C 6.9 (A) 12/15/2018   Last seen for diabetes 4 months ago.  Management since then includes; A1c 7.7 today which continues to trend upward. At this time add pioglitazone 15 mg daily. He is going to check with the pharmacist with how much Vania Rea would cost through his insurance.  He reports good compliance with treatment. He is not having side effects. none  Home blood sugar records: fasting range: 105-115  Episodes of hypoglycemia? No none   Current insulin regiment: n/a Most Recent Eye Exam: 11/03/2019  --------------------------------------------------------------------  Hypertension, follow-up  BP Readings from Last 3 Encounters:  12/13/19 131/83  08/17/19 130/86  04/14/19 107/73   Wt Readings from Last 3 Encounters:  12/13/19 192 lb (87.1 kg)  08/17/19 184 lb (83.5 kg)  04/14/19 183 lb (83 kg)     He was last seen for hypertension 4 months ago.  BP at that visit was 130/86. Management since that visit includes; Controlled. He reports good compliance with treatment. He is not having side effects. none He is exercising. He is adherent to low salt diet.   Outside blood  pressures are 130/80.  He does not smoke.  Use of agents associated with hypertension: none.   --------------------------------------------------------------------  Depression, Follow-up  He  was last seen for this 4 months ago. Changes made at last visit include; Good control.   He reports good compliance with treatment. He is not having side effects. none  He reports good tolerance of treatment. Current symptoms include: fatigue He feels he is Unchanged since last visit.  Depression screen [facility] 2/9 12/13/2019 08/17/2019 06/11/2018  Decreased Interest 0 0 0  Down, Depressed, Hopeless 1 1 0  PHQ - 2 Score 1 1 0  Altered sleeping 0 1 0  Tired, decreased energy 1 2 1   Change in appetite 0 0 0  Feeling bad or failure about yourself  0 0 0  Trouble concentrating 0 0 0  Moving slowly or fidgety/restless 0 0 0  Suicidal thoughts 0 0 0  PHQ-9 Score 2 4 1   Difficult doing work/chores Not difficult at all Not difficult at all Not difficult at all    --------------------------------------------------------------------   History reviewed. No pertinent past medical history.     Medications: Outpatient Medications Prior to Visit  Medication Sig  . aspirin 81 MG tablet Take by mouth.  Marland Kitchen atorvastatin (LIPITOR) 20 MG tablet TAKE 1 TABLET BY MOUTH  DAILY AT 6 PM  . cholecalciferol (VITAMIN D) 1000 UNITS tablet Take by mouth.  . Coenzyme Q10 100 MG TABS Take by mouth.  Marland Kitchen DILT-XR 240 MG 24 hr capsule  TAKE 1 CAPSULE(240 MG) BY MOUTH DAILY  . Esomeprazole Magnesium (NEXIUM PO) Take by mouth.  . ezetimibe (ZETIA) 10 MG tablet TAKE 1 TABLET BY MOUTH  DAILY  . fluticasone (FLONASE) 50 MCG/ACT nasal spray Place into the nose. Reported on 08/14/2015  . lansoprazole (PREVACID 24HR) 15 MG capsule Take by mouth.  . levothyroxine (SYNTHROID) 75 MCG tablet TAKE 1 TABLET BY MOUTH  DAILY BEFORE BREAKFAST  . metFORMIN (GLUCOPHAGE) 1000 MG tablet TAKE 1 TABLET BY MOUTH  TWICE DAILY WITH MEALS  .  metoprolol succinate (TOPROL-XL) 100 MG 24 hr tablet TAKE 1 TABLET BY MOUTH  DAILY WITH OR IMMEDIATELY  FOLLOWING A MEAL  . montelukast (SINGULAIR) 10 MG tablet Take 1 tablet (10 mg total) by mouth at bedtime.  . nitroGLYCERIN (NITROSTAT) 0.4 MG SL tablet Place 1 tablet (0.4 mg total) under the tongue every 5 (five) minutes as needed for chest pain.  Marland Kitchen ondansetron (ZOFRAN ODT) 8 MG disintegrating tablet Take by mouth. Reported on 04/25/2015  . ONETOUCH ULTRA test strip CHECK BLOOD SUGAR ONCE  DAILY  . pioglitazone (ACTOS) 30 MG tablet Take 1 tablet (30 mg total) by mouth daily.  Marland Kitchen pyridOXINE (VITAMIN B-6) 100 MG tablet Take 100 mg by mouth daily.  . Saw Palmetto 160 MG TABS Take by mouth.  . sertraline (ZOLOFT) 100 MG tablet TAKE 1 TABLET BY MOUTH  DAILY  . temazepam (RESTORIL) 30 MG capsule Take 1 capsule (30 mg total) by mouth at bedtime.   No facility-administered medications prior to visit.    Review of Systems  Constitutional: Negative for appetite change, chills and fever.  Respiratory: Negative for chest tightness, shortness of breath and wheezing.   Cardiovascular: Negative for chest pain and palpitations.  Gastrointestinal: Negative for abdominal pain, nausea and vomiting.    Last hemoglobin A1c Lab Results  Component Value Date   HGBA1C 7.2 (A) 12/13/2019      Objective    BP 131/83 (BP Location: Left Arm, Patient Position: Sitting, Cuff Size: Large)   Pulse 89   Temp 98.2 F (36.8 C) (Oral)   Resp 16   Ht 5\' 10"  (1.778 m)   Wt 192 lb (87.1 kg)   SpO2 98%   BMI 27.55 kg/m  BP Readings from Last 3 Encounters:  12/13/19 131/83  08/17/19 130/86  04/14/19 107/73   Wt Readings from Last 3 Encounters:  12/13/19 192 lb (87.1 kg)  08/17/19 184 lb (83.5 kg)  04/14/19 183 lb (83 kg)      Physical Exam Vitals reviewed.  Constitutional:      Appearance: Normal appearance.  HENT:     Head: Normocephalic and atraumatic.     Right Ear: External ear normal.     Left  Ear: External ear normal.  Eyes:     General: No scleral icterus.    Conjunctiva/sclera: Conjunctivae normal.  Cardiovascular:     Rate and Rhythm: Normal rate and regular rhythm.     Pulses: Normal pulses.     Heart sounds: Normal heart sounds.  Pulmonary:     Effort: Pulmonary effort is normal.     Breath sounds: Normal breath sounds.  Musculoskeletal:     Right lower leg: No edema.     Left lower leg: No edema.  Skin:    General: Skin is warm and dry.  Neurological:     General: No focal deficit present.     Mental Status: He is alert and oriented to person, place, and time.  Psychiatric:        Mood and Affect: Mood normal.        Behavior: Behavior normal.        Thought Content: Thought content normal.        Judgment: Judgment normal.       No results found for any visits on 12/13/19.  Assessment & Plan     1. Type 2 diabetes mellitus with other circulatory complication, without long-term current use of insulin (HCC) A1c down to 7.2 from 7.7.  Continue present regimen. - POCT glycosylated hemoglobin (Hb A1C)  2. Major depression, single episode, in complete remission ([place]) Continue sertraline indefinitely.  Follow-up neck sprain - sertraline (ZOLOFT) 100 MG tablet; Take 1 tablet (100 mg total) by mouth daily.  Dispense: 90 tablet; Refill: 1  3. Essential (primary) hypertension Good control  4. Coronary artery disease involving native coronary artery of native heart without angina pectoris All risk factors treated.  Lipids treated  5. Adult hypothyroidism TSH stable   No follow-ups on file.         Kyren Knick Cranford Mon, MD  [facility] [phone] (phone) [phone] (fax)  [place]

## 2019-12-20 ENCOUNTER — Ambulatory Visit: Payer: Self-pay | Admitting: Family Medicine

## 2020-03-01 ENCOUNTER — Other Ambulatory Visit: Payer: Self-pay | Admitting: Family Medicine

## 2020-03-01 DIAGNOSIS — E119 Type 2 diabetes mellitus without complications: Secondary | ICD-10-CM

## 2020-03-01 DIAGNOSIS — F325 Major depressive disorder, single episode, in full remission: Secondary | ICD-10-CM

## 2020-03-01 DIAGNOSIS — E785 Hyperlipidemia, unspecified: Secondary | ICD-10-CM

## 2020-03-01 DIAGNOSIS — I10 Essential (primary) hypertension: Secondary | ICD-10-CM

## 2020-03-01 DIAGNOSIS — E039 Hypothyroidism, unspecified: Secondary | ICD-10-CM

## 2020-03-01 NOTE — Telephone Encounter (Signed)
Requested medications are due for refill today yes  Requested medications are on the active medication list yes  Last refill Dec 2021  Last visit 11/2019  Future visit scheduled 03/2020  Notes to clinic Pertinent labs are older than 360 days, pt does have an upcoming appt scheduled 03/2020, please assess,

## 2020-03-08 ENCOUNTER — Other Ambulatory Visit: Payer: Self-pay

## 2020-03-08 ENCOUNTER — Ambulatory Visit (INDEPENDENT_AMBULATORY_CARE_PROVIDER_SITE_OTHER): Payer: 59

## 2020-03-08 DIAGNOSIS — Z23 Encounter for immunization: Secondary | ICD-10-CM | POA: Diagnosis not present

## 2020-04-11 NOTE — Progress Notes (Signed)
Complete physical exam   Patient: [name] [name]   DOB: 1955/04/30   65 y.o. Male  MRN: [id number] Visit Date: 04/12/2020  Today's healthcare provider: [name], MD   Chief Complaint  Patient presents with  . Annual Exam   Subjective    [name] [name] is a 65 y.o. male who presents today for a complete physical exam.  He reports consuming a general diet. The patient does not participate in regular exercise at present. He generally feels well. He reports sleeping well. He does not have additional problems to discuss today.    No past medical history on file. Past Surgical History:  Procedure Laterality Date  . CARDIAC SURGERY  05/30/05   stent placement   Social History   Socioeconomic History  . Marital status: Married    Spouse name: Not on file  . Number of children: Not on file  . Years of education: Not on file  . Highest education level: Not on file  Occupational History  . Not on file  Tobacco Use  . Smoking status: Never Smoker  . Smokeless tobacco: Never Used  Substance and Sexual Activity  . Alcohol use: Yes    Comment: 2-3 beer on the weekends  . Drug use: No  . Sexual activity: Yes  Other Topics Concern  . Not on file  Social History Narrative  . Not on file   Social Determinants of Health   Financial Resource Strain: Not on file  Food Insecurity: Not on file  Transportation Needs: Not on file  Physical Activity: Not on file  Stress: Not on file  Social Connections: Not on file  Intimate Partner Violence: Not on file   Family Status  Relation Name Status  . Mother  Deceased at age 58  . Father  Deceased  . Sister  Alive  . Brother  Alive  . Daughter  Alive  . Son  Alive  . Brother  Alive  . Sister  Alive   Family History  Problem Relation Age of Onset  . Diabetes Mother   . Heart disease Mother   . Hypertension Mother   . Skin cancer Mother   . Glaucoma Mother   . Ulcers Mother   . Gout Mother   . Heart disease  Father   . Hypertension Father   . Prostate cancer Father   . Hypertension Sister   . Hypertension Sister    Allergies  Allergen Reactions  . Enalapril Swelling    Lip swelling    Patient Care Team: Jerrol Banana., MD as PCP - General (Family Medicine)   Medications: Outpatient Medications Prior to Visit  Medication Sig  . aspirin 81 MG tablet Take by mouth.  Marland Kitchen atorvastatin (LIPITOR) 20 MG tablet TAKE 1 TABLET BY MOUTH  DAILY AT 6 PM  . cholecalciferol (VITAMIN D) 1000 UNITS tablet Take by mouth.  . Coenzyme Q10 100 MG TABS Take by mouth.  Marland Kitchen DILT-XR 240 MG 24 hr capsule TAKE 1 CAPSULE(240 MG) BY MOUTH DAILY  . Esomeprazole Magnesium (NEXIUM PO) Take by mouth.  . ezetimibe (ZETIA) 10 MG tablet TAKE 1 TABLET BY MOUTH  DAILY  . fluticasone (FLONASE) 50 MCG/ACT nasal spray Place into the nose. Reported on 08/14/2015  . lansoprazole (PREVACID) 15 MG capsule Take by mouth.  . levothyroxine (SYNTHROID) 75 MCG tablet TAKE 1 TABLET BY MOUTH  DAILY BEFORE BREAKFAST  . metFORMIN (GLUCOPHAGE) 1000 MG tablet TAKE 1 TABLET BY  MOUTH  TWICE DAILY WITH MEALS  . metoprolol succinate (TOPROL-XL) 100 MG 24 hr tablet TAKE 1 TABLET BY MOUTH  DAILY WITH OR IMMEDIATELY  FOLLOWING A MEAL  . montelukast (SINGULAIR) 10 MG tablet Take 1 tablet (10 mg total) by mouth at bedtime.  . nitroGLYCERIN (NITROSTAT) 0.4 MG SL tablet Place 1 tablet (0.4 mg total) under the tongue every 5 (five) minutes as needed for chest pain.  Marland Kitchen ondansetron (ZOFRAN-ODT) 8 MG disintegrating tablet Take by mouth. Reported on 04/25/2015  . ONETOUCH ULTRA test strip CHECK BLOOD SUGAR ONCE  DAILY  . pioglitazone (ACTOS) 30 MG tablet Take 1 tablet (30 mg total) by mouth daily.  Marland Kitchen pyridOXINE (VITAMIN B-6) 100 MG tablet Take 100 mg by mouth daily.  . Saw Palmetto 160 MG TABS Take by mouth.  . sertraline (ZOLOFT) 100 MG tablet TAKE 1 TABLET BY MOUTH  DAILY  . temazepam (RESTORIL) 30 MG capsule Take 1 capsule (30 mg total) by mouth at  bedtime.   No facility-administered medications prior to visit.    Review of Systems  All other systems reviewed and are negative.      Objective    BP (!) 133/93   Pulse 90   Temp 98.3 F (36.8 C)   Resp 16   Ht 5\' 11"  (1.803 m)   Wt 192 lb (87.1 kg)   BMI 26.78 kg/m  BP Readings from Last 3 Encounters:  04/12/20 (!) 133/93  12/13/19 131/83  08/17/19 130/86   Wt Readings from Last 3 Encounters:  04/12/20 192 lb (87.1 kg)  12/13/19 192 lb (87.1 kg)  08/17/19 184 lb (83.5 kg)      Physical Exam Vitals reviewed.  Constitutional:      Appearance: He is well-developed.     Comments: WNWDWM appears younger than his age.  HENT:     Head: Normocephalic and atraumatic.     Right Ear: External ear normal.     Left Ear: External ear normal.     Nose: Nose normal.  Eyes:     Conjunctiva/sclera: Conjunctivae normal.     Pupils: Pupils are equal, round, and reactive to light.  Cardiovascular:     Rate and Rhythm: Normal rate and regular rhythm.     Heart sounds: Normal heart sounds.  Pulmonary:     Effort: Pulmonary effort is normal.     Breath sounds: Normal breath sounds.  Abdominal:     Palpations: Abdomen is soft.  Genitourinary:    Penis: Normal.      Prostate: Normal.     Rectum: Normal. Guaiac result positive.  Musculoskeletal:        General: Normal range of motion.     Cervical back: Normal range of motion and neck supple.  Skin:    General: Skin is warm and dry.  Neurological:     General: No focal deficit present.     Mental Status: He is alert and oriented to person, place, and time.  Psychiatric:        Mood and Affect: Mood normal.        Behavior: Behavior normal.        Thought Content: Thought content normal.        Judgment: Judgment normal.       Last depression screening scores PHQ 2/9 Scores 12/13/2019 08/17/2019 06/11/2018  PHQ - 2 Score 1 1 0  PHQ- 9 Score 2 4 1    Last fall risk screening Fall Risk  08/19/2017  Falls  in the past  year? No   Last Audit-C alcohol use screening Alcohol Use Disorder Test (AUDIT) 12/13/2019  1. How often do you have a drink containing alcohol? 2  2. How many drinks containing alcohol do you have on a typical day when you are drinking? 0  3. How often do you have six or more drinks on one occasion? 1  AUDIT-C Score 3  4. How often during the last year have you found that you were not able to stop drinking once you had started? 0  5. How often during the last year have you failed to do what was normally expected from you because of drinking? 0  6. How often during the last year have you needed a first drink in the morning to get yourself going after a heavy drinking session? 0  7. How often during the last year have you had a feeling of guilt of remorse after drinking? 0  8. How often during the last year have you been unable to remember what happened the night before because you had been drinking? 0  9. Have you or someone else been injured as a result of your drinking? 0  10. Has a relative or friend or a doctor or another health worker been concerned about your drinking or suggested you cut down? 0  Alcohol Use Disorder Identification Test Final Score (AUDIT) 3  Alcohol Brief Interventions/Follow-up AUDIT Score <7 follow-up not indicated   A score of 3 or more in women, and 4 or more in men indicates increased risk for alcohol abuse, EXCEPT if all of the points are from question 1   No results found for any visits on 04/12/20.  Assessment & Plan    Routine Health Maintenance and Physical Exam  Exercise Activities and Dietary recommendations Goals   None     Immunization History  Administered Date(s) Administered  . Fluad Quad(high Dose 65+) 12/15/2018  . Influenza Split 12/04/2010, 12/03/2011  . Influenza,inj,Quad PF,6+ Mos 01/05/2013, 10/27/2013, 12/25/2015, 12/16/2016, 10/16/2017  . PFIZER(Purple Top)SARS-COV-2 Vaccination 04/21/2019, 05/12/2019, 03/08/2020  . Pneumococcal  Conjugate-13 01/05/2013  . Tdap 02/09/2008    Health Maintenance  Topic Date Due  . Hepatitis C Screening  Never done  . FOOT EXAM  08/21/2017  . TETANUS/TDAP  02/08/2018  . URINE MICROALBUMIN  04/16/2018  . INFLUENZA VACCINE  08/29/2019  . HEMOGLOBIN A1C  06/11/2020  . OPHTHALMOLOGY EXAM  11/02/2020  . COLONOSCOPY (Pts 45-25yrs Insurance coverage will need to be confirmed)  11/30/2023  . COVID-19 Vaccine  Completed  . HIV Screening  Completed  . HPV VACCINES  Aged Out    Discussed health benefits of physical activity, and encouraged him to engage in regular exercise appropriate for his age and condition.  1. Annual physical exam OC is positive.  We will follow-up with work-up options in the future. - POCT urinalysis dipstick  2. Type 2 diabetes mellitus with other circulatory complication, without long-term current use of insulin (HCC)  - Hemoglobin A1c  3. Essential (primary) hypertension  - Comprehensive Metabolic Panel (CMET)  4. Hyperlipidemia, unspecified hyperlipidemia type  - Lipid panel  5. Atrial fibrillation, unspecified type ([place])   6. Adult hypothyroidism  - TSH  7. Coronary artery disease involving native coronary artery of native heart without angina pectoris  - CBC w/Diff/Platelet  8. Screening for blood or protein in urine   9. Screening for prostate cancer  - PSA   No follow-ups on file.  I, [name], MD, have reviewed all documentation for this visit. The documentation on 04/14/20 for the exam, diagnosis, procedures, and orders are all accurate and complete.    Richard Cranford Mon, MD  [facility] [phone] (phone) [phone] (fax)  [place]

## 2020-04-12 ENCOUNTER — Encounter: Payer: Self-pay | Admitting: Family Medicine

## 2020-04-12 ENCOUNTER — Ambulatory Visit (INDEPENDENT_AMBULATORY_CARE_PROVIDER_SITE_OTHER): Payer: 59 | Admitting: Family Medicine

## 2020-04-12 ENCOUNTER — Other Ambulatory Visit: Payer: Self-pay

## 2020-04-12 ENCOUNTER — Telehealth: Payer: Self-pay

## 2020-04-12 VITALS — BP 133/93 | HR 90 | Temp 98.3°F | Resp 16 | Ht 71.0 in | Wt 192.0 lb

## 2020-04-12 DIAGNOSIS — Z1211 Encounter for screening for malignant neoplasm of colon: Secondary | ICD-10-CM | POA: Diagnosis not present

## 2020-04-12 DIAGNOSIS — I2510 Atherosclerotic heart disease of native coronary artery without angina pectoris: Secondary | ICD-10-CM

## 2020-04-12 DIAGNOSIS — I4891 Unspecified atrial fibrillation: Secondary | ICD-10-CM

## 2020-04-12 DIAGNOSIS — Z0000 Encounter for general adult medical examination without abnormal findings: Secondary | ICD-10-CM

## 2020-04-12 DIAGNOSIS — E785 Hyperlipidemia, unspecified: Secondary | ICD-10-CM

## 2020-04-12 DIAGNOSIS — Z125 Encounter for screening for malignant neoplasm of prostate: Secondary | ICD-10-CM

## 2020-04-12 DIAGNOSIS — I10 Essential (primary) hypertension: Secondary | ICD-10-CM | POA: Diagnosis not present

## 2020-04-12 DIAGNOSIS — E1159 Type 2 diabetes mellitus with other circulatory complications: Secondary | ICD-10-CM

## 2020-04-12 DIAGNOSIS — Z1389 Encounter for screening for other disorder: Secondary | ICD-10-CM

## 2020-04-12 DIAGNOSIS — E039 Hypothyroidism, unspecified: Secondary | ICD-10-CM

## 2020-04-12 LAB — POCT URINALYSIS DIPSTICK
Bilirubin, UA: NEGATIVE
Blood, UA: NEGATIVE
Glucose, UA: NEGATIVE
Ketones, UA: NEGATIVE
Leukocytes, UA: NEGATIVE
Nitrite, UA: NEGATIVE
Protein, UA: NEGATIVE
Spec Grav, UA: 1.02 (ref 1.010–1.025)
Urobilinogen, UA: 0.2 E.U./dL
pH, UA: 6.5 (ref 5.0–8.0)

## 2020-04-12 LAB — IFOBT (OCCULT BLOOD): IFOBT: POSITIVE

## 2020-04-12 NOTE — Telephone Encounter (Signed)
[facility] if in network

## 2020-04-12 NOTE — Telephone Encounter (Signed)
Patient came back in the office after his appt with Dr. Rosanna Randy to advise [place] that his insurance is not accepted at [facility] cardiology and that he needs to find a cardiologist that does accept [facility].   Please refer to cardiologist in network.

## 2020-04-12 NOTE — Telephone Encounter (Signed)
Advised patient. Also, pt was seen in the office earlier and his OC lite was positive. FYI.

## 2020-04-12 NOTE — Telephone Encounter (Signed)
Who would you like to refer the patient to?

## 2020-04-13 LAB — CBC WITH DIFFERENTIAL/PLATELET
Basophils Absolute: 0.1 10*3/uL (ref 0.0–0.2)
Basos: 1 %
EOS (ABSOLUTE): 0.1 10*3/uL (ref 0.0–0.4)
Eos: 2 %
Hematocrit: 35.4 % — ABNORMAL LOW (ref 37.5–51.0)
Hemoglobin: 11.3 g/dL — ABNORMAL LOW (ref 13.0–17.7)
Immature Grans (Abs): 0 10*3/uL (ref 0.0–0.1)
Immature Granulocytes: 0 %
Lymphocytes Absolute: 1.9 10*3/uL (ref 0.7–3.1)
Lymphs: 28 %
MCH: 26.2 pg — ABNORMAL LOW (ref 26.6–33.0)
MCHC: 31.9 g/dL (ref 31.5–35.7)
MCV: 82 fL (ref 79–97)
Monocytes Absolute: 0.5 10*3/uL (ref 0.1–0.9)
Monocytes: 8 %
Neutrophils Absolute: 4 10*3/uL (ref 1.4–7.0)
Neutrophils: 61 %
Platelets: 235 10*3/uL (ref 150–450)
RBC: 4.32 x10E6/uL (ref 4.14–5.80)
RDW: 15.1 % (ref 11.6–15.4)
WBC: 6.6 10*3/uL (ref 3.4–10.8)

## 2020-04-13 LAB — LIPID PANEL
Chol/HDL Ratio: 2.2 ratio (ref 0.0–5.0)
Cholesterol, Total: 126 mg/dL (ref 100–199)
HDL: 57 mg/dL (ref >39)
LDL Chol Calc (NIH): 41 mg/dL (ref 0–99)
Triglycerides: 171 mg/dL — ABNORMAL HIGH (ref 0–149)
VLDL Cholesterol Cal: 28 mg/dL (ref 5–40)

## 2020-04-13 LAB — HEMOGLOBIN A1C
Est. average glucose Bld gHb Est-mCnc: 171 mg/dL
Hgb A1c MFr Bld: 7.6 % — ABNORMAL HIGH (ref 4.8–5.6)

## 2020-04-13 LAB — COMPREHENSIVE METABOLIC PANEL
ALT: 29 IU/L (ref 0–44)
AST: 27 IU/L (ref 0–40)
Albumin/Globulin Ratio: 1.8 (ref 1.2–2.2)
Albumin: 4.5 g/dL (ref 3.8–4.8)
Alkaline Phosphatase: 76 IU/L (ref 44–121)
BUN/Creatinine Ratio: 16 (ref 10–24)
BUN: 16 mg/dL (ref 8–27)
Bilirubin Total: 0.4 mg/dL (ref 0.0–1.2)
CO2: 19 mmol/L — ABNORMAL LOW (ref 20–29)
Calcium: 9.9 mg/dL (ref 8.6–10.2)
Chloride: 103 mmol/L (ref 96–106)
Creatinine, Ser: 0.97 mg/dL (ref 0.76–1.27)
Globulin, Total: 2.5 g/dL (ref 1.5–4.5)
Glucose: 157 mg/dL — ABNORMAL HIGH (ref 65–99)
Potassium: 4.7 mmol/L (ref 3.5–5.2)
Sodium: 140 mmol/L (ref 134–144)
Total Protein: 7 g/dL (ref 6.0–8.5)
eGFR: 87 mL/min/{1.73_m2} (ref >59)

## 2020-04-13 LAB — PSA: Prostate Specific Ag, Serum: 1.4 ng/mL (ref 0.0–4.0)

## 2020-04-13 LAB — TSH: TSH: 3.52 u[IU]/mL (ref 0.450–4.500)

## 2020-04-19 ENCOUNTER — Other Ambulatory Visit: Payer: Self-pay | Admitting: Family Medicine

## 2020-04-19 DIAGNOSIS — G4709 Other insomnia: Secondary | ICD-10-CM

## 2020-04-19 MED ORDER — TEMAZEPAM 30 MG PO CAPS: 30.0000 mg | ORAL_CAPSULE | Freq: Every day | ORAL | 1 refills | Status: DC

## 2020-04-19 NOTE — Telephone Encounter (Signed)
Medication Refill - Medication: Temazepam   Has the patient contacted their pharmacy? No. Pts wife called stating that they always have to call it into the office. She is requesting to have a 90 day supply of medication. Please advise.  (Agent: If no, request that the patient contact the pharmacy for the refill.) (Agent: If yes, when and what did the pharmacy advise?)  Preferred Pharmacy (with phone number or street name):  [facility] [place], [address]  [place] [address]  Phone: [phone] Fax: [phone]  Hours: Not open 24 hours     Agent: Please be advised that RX refills may take up to 3 business days. We ask that you follow-up with your pharmacy.

## 2020-04-20 ENCOUNTER — Other Ambulatory Visit: Payer: Self-pay | Admitting: Family Medicine

## 2020-04-20 DIAGNOSIS — F325 Major depressive disorder, single episode, in full remission: Secondary | ICD-10-CM

## 2020-04-25 ENCOUNTER — Other Ambulatory Visit: Payer: Self-pay

## 2020-04-25 DIAGNOSIS — F325 Major depressive disorder, single episode, in full remission: Secondary | ICD-10-CM

## 2020-04-25 NOTE — Telephone Encounter (Signed)
Patient is out of Zoloft refills.  Please send to [facility] in [place]

## 2020-04-26 ENCOUNTER — Other Ambulatory Visit: Payer: Self-pay | Admitting: *Deleted

## 2020-04-26 ENCOUNTER — Telehealth: Payer: Self-pay

## 2020-04-26 DIAGNOSIS — I2510 Atherosclerotic heart disease of native coronary artery without angina pectoris: Secondary | ICD-10-CM

## 2020-04-26 MED ORDER — SERTRALINE HCL 100 MG PO TABS: 100.0000 mg | ORAL_TABLET | Freq: Every day | ORAL | 3 refills | Status: DC

## 2020-04-26 NOTE — Progress Notes (Signed)
Referral ordered

## 2020-04-26 NOTE — Telephone Encounter (Signed)
Patient was suppose to be sent to a cardiologist that takes his Insurance for an ultrasound of his heart.  He has not heard a word from anyone about this.  [name] [name] didn't get a referral from [place] either.  Can someone please do this ASAP.

## 2020-05-22 ENCOUNTER — Other Ambulatory Visit: Payer: Self-pay | Admitting: Family Medicine

## 2020-05-22 DIAGNOSIS — F325 Major depressive disorder, single episode, in full remission: Secondary | ICD-10-CM

## 2020-05-22 DIAGNOSIS — E039 Hypothyroidism, unspecified: Secondary | ICD-10-CM

## 2020-05-22 DIAGNOSIS — E119 Type 2 diabetes mellitus without complications: Secondary | ICD-10-CM

## 2020-05-22 DIAGNOSIS — I10 Essential (primary) hypertension: Secondary | ICD-10-CM

## 2020-05-22 NOTE — Telephone Encounter (Signed)
Requested Prescriptions  Pending Prescriptions Disp Refills  . metoprolol succinate (TOPROL-XL) 100 MG 24 hr tablet [Pharmacy Med Name: Metoprolol Succinate ER 100 MG Oral Tablet Extended Release 24 Hour] 90 tablet 1    Sig: TAKE 1 TABLET BY MOUTH  DAILY WITH OR IMMEDIATELY  FOLLOWING A MEAL     Cardiovascular:  Beta Blockers Failed - 05/22/2020  9:15 PM      Failed - Last BP in normal range    BP Readings from Last 1 Encounters:  04/12/20 (!) 133/93         Passed - Last Heart Rate in normal range    Pulse Readings from Last 1 Encounters:  04/12/20 90         Passed - Valid encounter within last 6 months    Recent Outpatient Visits          1 month ago Annual physical exam   [facility] Jerrol Banana., MD   5 months ago Type 2 diabetes mellitus with other circulatory complication, without long-term current use of insulin [facility])   [facility] Jerrol Banana., MD   9 months ago Type 2 diabetes mellitus with other circulatory complication, without long-term current use of insulin [facility])   [facility] Jerrol Banana., MD   1 year ago Well controlled diabetes mellitus [facility])   [facility] Jerrol Banana., MD   1 year ago Essential (primary) hypertension   [facility] Jerrol Banana., MD      Future Appointments            In 2 days Jerrol Banana., MD [facility], [place]   In 2 months Jerrol Banana., MD [facility], [place]   In 3 months Gollan, Kathlene November, MD [facility], [facility]           . metFORMIN (GLUCOPHAGE) 1000 MG tablet [Pharmacy Med Name: metFORMIN HCl 1000 MG Oral Tablet] 180 tablet 1    Sig: TAKE 1 TABLET BY MOUTH  TWICE DAILY WITH MEALS     Endocrinology:  Diabetes - Biguanides Passed - 05/22/2020  9:15 PM      Passed - Cr in normal range and within 360 days    Creatinine, Ser  Date Value Ref Range  Status  04/12/2020 0.97 0.76 - 1.27 mg/dL Final         Passed - HBA1C is between 0 and 7.9 and within 180 days    Hgb A1c MFr Bld  Date Value Ref Range Status  04/12/2020 7.6 (H) 4.8 - 5.6 % Final    Comment:             Prediabetes: 5.7 - 6.4          Diabetes: >6.4          Glycemic control for adults with diabetes: <7.0          Passed - eGFR in normal range and within 360 days    GFR calc Af Amer  Date Value Ref Range Status  04/14/2019 96 >59 mL/min/1.73 Final   GFR calc non Af Amer  Date Value Ref Range Status  04/14/2019 83 >59 mL/min/1.73 Final   eGFR  Date Value Ref Range Status  04/12/2020 87 >59 mL/min/1.73 Final         Passed - Valid encounter within last 6 months    Recent Outpatient Visits  1 month ago Annual physical exam   [facility] Jerrol Banana., MD   5 months ago Type 2 diabetes mellitus with other circulatory complication, without long-term current use of insulin [facility])   [facility] Jerrol Banana[facility] MD   9 months ago Type 2 diabetes mellitus with other circulatory complication, without long-term current use of insulin [facility])   [facility] Jerrol Banana., MD   1 year ago Well controlled diabetes mellitus [facility])   [facility] Jerrol Banana., MD   1 year ago Essential (primary) hypertension   [facility] Jerrol Banana., MD      Future Appointments            In 2 days Jerrol Banana., MD [facility], [place]   In 2 months Jerrol Banana., MD [facility], [place]   In 3 months Gollan, Kathlene November, MD [facility], [facility]           . levothyroxine (SYNTHROID) 75 MCG tablet [Pharmacy Med Name: Levothyroxine Sodium 75 MCG Oral Tablet] 90 tablet 3    Sig: TAKE 1 TABLET BY MOUTH  DAILY BEFORE BREAKFAST     Endocrinology:  Hypothyroid Agents Failed - 05/22/2020  9:15 PM       Failed - TSH needs to be rechecked within 3 months after an abnormal result. Refill until TSH is due.      Passed - TSH in normal range and within 360 days    TSH  Date Value Ref Range Status  04/12/2020 3.520 0.450 - 4.500 uIU/mL Final         Passed - Valid encounter within last 12 months    Recent Outpatient Visits          1 month ago Annual physical exam   [facility] Jerrol Banana., MD   5 months ago Type 2 diabetes mellitus with other circulatory complication, without long-term current use of insulin [facility])   [facility] Jerrol Banana., MD   9 months ago Type 2 diabetes mellitus with other circulatory complication, without long-term current use of insulin [facility])   [facility] Jerrol Banana., MD   1 year ago Well controlled diabetes mellitus [facility])   [facility] Jerrol Banana., MD   1 year ago Essential (primary) hypertension   [facility] Jerrol Banana., MD      Future Appointments            In 2 days Jerrol Banana., MD [facility], [place]   In 2 months Jerrol Banana., MD [facility], [place]   In 3 months Gollan, Kathlene November, MD [facility], [facility]           . sertraline (ZOLOFT) 100 MG tablet [Pharmacy Med Name: Sertraline HCl 100 MG Oral Tablet] 90 tablet 3    Sig: TAKE 1 TABLET BY MOUTH  DAILY     Psychiatry:  Antidepressants - SSRI Passed - 05/22/2020  9:15 PM      Passed - Completed PHQ-2 or PHQ-9 in the last 360 days      Passed - Valid encounter within last 6 months    Recent Outpatient Visits          1 month ago Annual physical exam   [facility] Jerrol Banana., MD  5 months ago Type 2 diabetes mellitus with other circulatory complication, without long-term current use of insulin [facility])   [facility] Jerrol Banana., MD   9 months ago Type 2  diabetes mellitus with other circulatory complication, without long-term current use of insulin [facility])   [facility] Jerrol Banana., MD   1 year ago Well controlled diabetes mellitus [facility])   [facility] Jerrol Banana., MD   1 year ago Essential (primary) hypertension   [facility] Jerrol Banana., MD      Future Appointments            In 2 days Jerrol Banana., MD [facility], [place]   In 2 months Jerrol Banana., MD [facility], [place]   In 3 months Gollan, Kathlene November, MD [facility], [place]

## 2020-05-24 ENCOUNTER — Encounter: Payer: Self-pay | Admitting: Family Medicine

## 2020-05-24 ENCOUNTER — Other Ambulatory Visit: Payer: Self-pay

## 2020-05-24 ENCOUNTER — Ambulatory Visit (INDEPENDENT_AMBULATORY_CARE_PROVIDER_SITE_OTHER): Payer: 59 | Admitting: Family Medicine

## 2020-05-24 VITALS — BP 132/99 | HR 118 | Temp 98.4°F | Wt 196.8 lb

## 2020-05-24 DIAGNOSIS — K649 Unspecified hemorrhoids: Secondary | ICD-10-CM

## 2020-05-24 DIAGNOSIS — D509 Iron deficiency anemia, unspecified: Secondary | ICD-10-CM | POA: Diagnosis not present

## 2020-05-24 NOTE — Progress Notes (Signed)
Established patient visit   Patient: [name] [name]   DOB: 09/05/1955   65 y.o. Male  MRN: [id number] Visit Date: 05/24/2020  Today's healthcare provider: [name], MD   Chief Complaint  Patient presents with  . Follow-up   I,Porsha C McClurkin,acting as a scribe for [name], MD.,have documented all relevant documentation on the behalf of [name], MD,as directed by  [name], MD while in the presence of [name], MD.  Subjective    HPI  Follow-up Patient is here for follow up on positive fecal blood and discuss labs from 04/12/2020. He forgot to mention to [place] when he was here that he had some bleeding hemorrhoids recently.  He is feeling better than he was     Medications: Outpatient Medications Prior to Visit  Medication Sig  . aspirin 81 MG tablet Take by mouth.  Marland Kitchen atorvastatin (LIPITOR) 20 MG tablet TAKE 1 TABLET BY MOUTH  DAILY AT 6 PM  . cholecalciferol (VITAMIN D) 1000 UNITS tablet Take by mouth.  . Coenzyme Q10 100 MG TABS Take by mouth.  Marland Kitchen DILT-XR 240 MG 24 hr capsule TAKE 1 CAPSULE(240 MG) BY MOUTH DAILY  . Esomeprazole Magnesium (NEXIUM PO) Take by mouth.  . ezetimibe (ZETIA) 10 MG tablet TAKE 1 TABLET BY MOUTH  DAILY  . fluticasone (FLONASE) 50 MCG/ACT nasal spray Place into the nose. Reported on 08/14/2015  . lansoprazole (PREVACID) 15 MG capsule Take by mouth.  . levothyroxine (SYNTHROID) 75 MCG tablet TAKE 1 TABLET BY MOUTH  DAILY BEFORE BREAKFAST  . metFORMIN (GLUCOPHAGE) 1000 MG tablet TAKE 1 TABLET BY MOUTH  TWICE DAILY WITH MEALS  . metoprolol succinate (TOPROL-XL) 100 MG 24 hr tablet TAKE 1 TABLET BY MOUTH  DAILY WITH OR IMMEDIATELY  FOLLOWING A MEAL  . montelukast (SINGULAIR) 10 MG tablet Take 1 tablet (10 mg total) by mouth at bedtime.  . nitroGLYCERIN (NITROSTAT) 0.4 MG SL tablet Place 1 tablet (0.4 mg total) under the tongue every 5 (five) minutes as needed for chest pain.  Marland Kitchen ondansetron  (ZOFRAN-ODT) 8 MG disintegrating tablet Take by mouth. Reported on 04/25/2015  . ONETOUCH ULTRA test strip CHECK BLOOD SUGAR ONCE  DAILY  . pioglitazone (ACTOS) 30 MG tablet Take 1 tablet (30 mg total) by mouth daily.  Marland Kitchen pyridOXINE (VITAMIN B-6) 100 MG tablet Take 100 mg by mouth daily.  . Saw Palmetto 160 MG TABS Take by mouth.  . sertraline (ZOLOFT) 100 MG tablet Take 1 tablet (100 mg total) by mouth daily.  . temazepam (RESTORIL) 30 MG capsule Take 1 capsule (30 mg total) by mouth at bedtime.   No facility-administered medications prior to visit.    Review of Systems  Constitutional: Negative for appetite change, chills and fever.  Respiratory: Negative for chest tightness, shortness of breath and wheezing.   Cardiovascular: Negative for chest pain and palpitations.  Gastrointestinal: Negative for abdominal pain, nausea and vomiting.        Objective    BP (!) 132/99 (BP Location: Left Arm, Patient Position: Sitting, Cuff Size: Normal)   Pulse (!) 118   Temp 98.4 F (36.9 C) (Oral)   Wt 196 lb 12.8 oz (89.3 kg)   SpO2 99%   BMI 27.45 kg/m  BP Readings from Last 3 Encounters:  05/24/20 (!) 132/99  04/12/20 (!) 133/93  12/13/19 131/83   Wt Readings from Last 3 Encounters:  05/24/20 196 lb 12.8 oz (89.3 kg)  04/12/20 192 lb (87.1 kg)  12/13/19 192 lb (87.1 kg)       Physical Exam Vitals reviewed.  Constitutional:      Appearance: Normal appearance.  Eyes:     General: No scleral icterus.    Conjunctiva/sclera: Conjunctivae normal.  Cardiovascular:     Rate and Rhythm: Normal rate.     Heart sounds: Normal heart sounds.  Pulmonary:     Breath sounds: Normal breath sounds.  Neurological:     Mental Status: He is alert and oriented to person, place, and time.  Psychiatric:        Mood and Affect: Mood normal.        Behavior: Behavior normal.        Thought Content: Thought content normal.        Judgment: Judgment normal.       No results found for any  visits on 05/24/20.  Assessment & Plan     1. Iron deficiency anemia, unspecified iron deficiency anemia type As long as CBC is stable and not dropping we will just treat this expectantly as he is up-to-date on his colonoscopy.  May need referral for the hemorrhoids himself. - CBC w/Diff/Platelet - Iron, TIBC and Ferritin Panel  2. Hemorrhoids, unspecified hemorrhoid type  - CBC w/Diff/Platelet - Iron, TIBC and Ferritin Panel   Return in about 3 months (around 08/15/2020).      I, [name], MD, have reviewed all documentation for this visit. The documentation on 05/29/20 for the exam, diagnosis, procedures, and orders are all accurate and complete.    Shaterria Sager Cranford Mon, MD  [facility] [phone] (phone) [phone] (fax)  [place]

## 2020-05-25 LAB — CBC WITH DIFFERENTIAL/PLATELET
Basophils Absolute: 0.1 10*3/uL (ref 0.0–0.2)
Basos: 1 %
EOS (ABSOLUTE): 0.1 10*3/uL (ref 0.0–0.4)
Eos: 2 %
Hematocrit: 39.4 % (ref 37.5–51.0)
Hemoglobin: 12.2 g/dL — ABNORMAL LOW (ref 13.0–17.7)
Immature Grans (Abs): 0 10*3/uL (ref 0.0–0.1)
Immature Granulocytes: 0 %
Lymphocytes Absolute: 2.3 10*3/uL (ref 0.7–3.1)
Lymphs: 30 %
MCH: 25.7 pg — ABNORMAL LOW (ref 26.6–33.0)
MCHC: 31 g/dL — ABNORMAL LOW (ref 31.5–35.7)
MCV: 83 fL (ref 79–97)
Monocytes Absolute: 0.5 10*3/uL (ref 0.1–0.9)
Monocytes: 7 %
Neutrophils Absolute: 4.4 10*3/uL (ref 1.4–7.0)
Neutrophils: 60 %
Platelets: 281 10*3/uL (ref 150–450)
RBC: 4.75 x10E6/uL (ref 4.14–5.80)
RDW: 14.7 % (ref 11.6–15.4)
WBC: 7.4 10*3/uL (ref 3.4–10.8)

## 2020-05-25 LAB — IRON,TIBC AND FERRITIN PANEL
Ferritin: 9 ng/mL — ABNORMAL LOW (ref 30–400)
Iron Saturation: 7 % — CL (ref 15–55)
Iron: 39 ug/dL (ref 38–169)
Total Iron Binding Capacity: 539 ug/dL — ABNORMAL HIGH (ref 250–450)
UIBC: 500 ug/dL — ABNORMAL HIGH (ref 111–343)

## 2020-08-15 ENCOUNTER — Ambulatory Visit: Payer: 59 | Admitting: Family Medicine

## 2020-08-15 ENCOUNTER — Ambulatory Visit: Payer: Self-pay

## 2020-08-15 ENCOUNTER — Other Ambulatory Visit: Payer: Self-pay

## 2020-08-15 DIAGNOSIS — U071 COVID-19: Secondary | ICD-10-CM

## 2020-08-15 MED ORDER — NIRMATRELVIR/RITONAVIR (PAXLOVID)TABLET: 3.0000 | ORAL_TABLET | Freq: Two times a day (BID) | ORAL | 0 refills | Status: DC

## 2020-08-15 MED ORDER — NIRMATRELVIR/RITONAVIR (PAXLOVID)TABLET: ORAL_TABLET | ORAL | 0 refills | Status: DC

## 2020-08-15 NOTE — Telephone Encounter (Signed)
Pt. Tested positive today for COVID 19. Symptoms started Saturday. Cough, headache,sore throat,joint pain. Asking for medications. No availability in the practice. Request virtual visit through [facility] if possible. Pt. Does not have My Chart to do a visit with them.Please advise.

## 2020-08-15 NOTE — Telephone Encounter (Signed)
Pt called in stating he has tested positive for covid, and wanted a nurse to give him a call to see about what he can take or what his next steps are. Please advise.  Reason for Disposition  MILD difficulty breathing (e.g., minimal/no SOB at rest, SOB with walking, pulse <100)  Answer Assessment - Initial Assessment Questions 1. COVID-19 DIAGNOSIS: "Who made your COVID-19 diagnosis?" "Was it confirmed by a positive lab test or self-test?" If not diagnosed by a doctor (or NP/PA), ask "Are there lots of cases (community spread) where you live?" Note: See public health department website, if unsure.     [facility] 2. COVID-19 EXPOSURE: "Was there any known exposure to COVID before the symptoms began?" CDC Definition of close contact: within 6 feet (2 meters) for a total of 15 minutes or more over a 24-hour period.      No 3. ONSET: "When did the COVID-19 symptoms start?"      Saturday 4. WORST SYMPTOM: "What is your worst symptom?" (e.g., cough, fever, shortness of breath, muscle aches)     Cough 5. COUGH: "Do you have a cough?" If Yes, ask: "How bad is the cough?"       Yes - severe 6. FEVER: "Do you have a fever?" If Yes, ask: "What is your temperature, how was it measured, and when did it start?"     Yesterday 7. RESPIRATORY STATUS: "Describe your breathing?" (e.g., shortness of breath, wheezing, unable to speak)      Shortness of breath 8. BETTER-SAME-WORSE: "Are you getting better, staying the same or getting worse compared to yesterday?"  If getting worse, ask, "In what way?"     Same 9. HIGH RISK DISEASE: "Do you have any chronic medical problems?" (e.g., asthma, heart or lung disease, weak immune system, obesity, etc.)     HTN,Diabetes 10. VACCINE: "Have you had the COVID-19 vaccine?" If Yes, ask: "Which one, how many shots, when did you get it?"       Yes 11. BOOSTER: "Have you received your COVID-19 booster?" If Yes, ask: "Which one and when did you get it?"       Yes 12.  PREGNANCY: "Is there any chance you are pregnant?" "When was your last menstrual period?"       N/A 13. OTHER SYMPTOMS: "Do you have any other symptoms?"  (e.g., chills, fatigue, headache, loss of smell or taste, muscle pain, sore throat)       Headache, joint pain,sore throat 14. O2 SATURATION MONITOR:  "Do you use an oxygen saturation monitor (pulse oximeter) at home?" If Yes, ask "What is your reading (oxygen level) today?" "What is your usual oxygen saturation reading?" (e.g., 95%)       No  Protocols used: Coronavirus (COVID-19) Diagnosed or Suspected-A-AH

## 2020-08-15 NOTE — Progress Notes (Deleted)
Established patient visit   Patient: [name] [name]   DOB: 08/01/1955   65 y.o. Male  MRN: [id number] Visit Date: 08/15/2020  Today's healthcare provider: [name], MD   No chief complaint on file.  Subjective    HPI  Diabetes Mellitus Type II, follow-up  Lab Results  Component Value Date   HGBA1C 7.6 (H) 04/12/2020   HGBA1C 7.2 (A) 12/13/2019   HGBA1C 7.7 (A) 08/17/2019   Last seen for diabetes 4 months ago.  Management since then includes continuing the same treatment. He reports {excellent/good/fair/poor:19665} compliance with treatment. He {is/is not:21021397} having side effects. {document side effects if present:1}  Home blood sugar records: {diabetes glucometry results:16657}  Episodes of hypoglycemia? {Yes/No:20286} {enter details if yes:1}   Current insulin regiment: {***Type 'None' if not taking insulin                                                otherwise enter complete                                                 details of insulin regiment:1} Most Recent Eye Exam: ***  --------------------------------------------------------------------------------------------------- Hypertension, follow-up  BP Readings from Last 3 Encounters:  05/24/20 (!) 132/99  04/12/20 (!) 133/93  12/13/19 131/83   Wt Readings from Last 3 Encounters:  05/24/20 196 lb 12.8 oz (89.3 kg)  04/12/20 192 lb (87.1 kg)  12/13/19 192 lb (87.1 kg)     He was last seen for hypertension 4 months ago.  BP at that visit was 133/93. Management since that visit includes; on metoprolol. He reports {excellent/good/fair/poor:19665} compliance with treatment. He {is/is not:9024} having side effects. {document side effects if present:1} He {is/is not:9024} exercising. He {is/is not:9024} adherent to low salt diet.   Outside blood pressures are {enter patient reported home BP, or 'not being checked':1}.  He {does/does not:200015} smoke.  Use of agents associated with  hypertension: {bp agents assoc with hypertension:511}.   ---------------------------------------------------------------------------------------------------   {Show patient history (optional):23778}   Medications: Outpatient Medications Prior to Visit  Medication Sig   aspirin 81 MG tablet Take by mouth.   atorvastatin (LIPITOR) 20 MG tablet TAKE 1 TABLET BY MOUTH  DAILY AT 6 PM   cholecalciferol (VITAMIN D) 1000 UNITS tablet Take by mouth.   Coenzyme Q10 100 MG TABS Take by mouth.   DILT-XR 240 MG 24 hr capsule TAKE 1 CAPSULE(240 MG) BY MOUTH DAILY   Esomeprazole Magnesium (NEXIUM PO) Take by mouth.   ezetimibe (ZETIA) 10 MG tablet TAKE 1 TABLET BY MOUTH  DAILY   fluticasone (FLONASE) 50 MCG/ACT nasal spray Place into the nose. Reported on 08/14/2015   lansoprazole (PREVACID) 15 MG capsule Take by mouth.   levothyroxine (SYNTHROID) 75 MCG tablet TAKE 1 TABLET BY MOUTH  DAILY BEFORE BREAKFAST   metFORMIN (GLUCOPHAGE) 1000 MG tablet TAKE 1 TABLET BY MOUTH  TWICE DAILY WITH MEALS   metoprolol succinate (TOPROL-XL) 100 MG 24 hr tablet TAKE 1 TABLET BY MOUTH  DAILY WITH OR IMMEDIATELY  FOLLOWING A MEAL   montelukast (SINGULAIR) 10 MG tablet Take 1 tablet (10 mg total) by mouth at bedtime.   nitroGLYCERIN (NITROSTAT) 0.4 MG SL  tablet Place 1 tablet (0.4 mg total) under the tongue every 5 (five) minutes as needed for chest pain.   ondansetron (ZOFRAN-ODT) 8 MG disintegrating tablet Take by mouth. Reported on 04/25/2015   ONETOUCH ULTRA test strip CHECK BLOOD SUGAR ONCE  DAILY   pioglitazone (ACTOS) 30 MG tablet Take 1 tablet (30 mg total) by mouth daily.   pyridOXINE (VITAMIN B-6) 100 MG tablet Take 100 mg by mouth daily.   Saw Palmetto 160 MG TABS Take by mouth.   sertraline (ZOLOFT) 100 MG tablet Take 1 tablet (100 mg total) by mouth daily.   temazepam (RESTORIL) 30 MG capsule Take 1 capsule (30 mg total) by mouth at bedtime.   No facility-administered medications prior to visit.     Review of Systems  Constitutional:  Negative for appetite change, chills and fever.  Respiratory:  Negative for chest tightness, shortness of breath and wheezing.   Cardiovascular:  Negative for chest pain and palpitations.  Gastrointestinal:  Negative for abdominal pain, nausea and vomiting.   {Labs  Heme  Chem  Endocrine  Serology  Results Review (optional):23779}   Objective    There were no vitals taken for this visit. {Show previous vital signs (optional):23777}   Physical Exam  ***  No results found for any visits on 08/15/20.  Assessment & Plan     ***  No follow-ups on file.      {provider attestation***:1}   [name], MD  [facility] [phone] (phone) [phone] (fax)  [place]

## 2020-08-16 ENCOUNTER — Other Ambulatory Visit: Payer: Self-pay | Admitting: Family Medicine

## 2020-08-16 DIAGNOSIS — U071 COVID-19: Secondary | ICD-10-CM

## 2020-08-16 MED ORDER — NIRMATRELVIR/RITONAVIR (PAXLOVID)TABLET: 3.0000 | ORAL_TABLET | Freq: Two times a day (BID) | ORAL | 0 refills | Status: AC

## 2020-08-16 NOTE — Telephone Encounter (Signed)
Class for the Rx nirmatrelvir/ritonavir EUA (PAXLOVID) TABS Says print/ RX may not have gone through to pharmacy/ pt stated pharmacy hasnt received script / please resend to [facility], [address]  [place], [place] [address]  Phone:  [phone]  Fax:  [phone]

## 2020-08-16 NOTE — Addendum Note (Signed)
Addended by: Wilburt Finlay on: 08/16/2020 08:21 AM   Modules accepted: Orders

## 2020-08-16 NOTE — Telephone Encounter (Signed)
Rx faxed

## 2020-09-06 ENCOUNTER — Ambulatory Visit
Admission: RE | Admit: 2020-09-06 | Discharge: 2020-09-06 | Disposition: A | Discharge status: Home / Self Care | Payer: 59 | Attending: Family Medicine | Admitting: Family Medicine

## 2020-09-06 ENCOUNTER — Ambulatory Visit (INDEPENDENT_AMBULATORY_CARE_PROVIDER_SITE_OTHER): Payer: 59 | Admitting: Family Medicine

## 2020-09-06 ENCOUNTER — Encounter: Payer: Self-pay | Admitting: Family Medicine

## 2020-09-06 ENCOUNTER — Ambulatory Visit
Admission: RE | Admit: 2020-09-06 | Discharge: 2020-09-06 | Disposition: A | Discharge status: Home / Self Care | Payer: 59 | Source: Ambulatory Visit | Attending: Family Medicine | Admitting: Family Medicine

## 2020-09-06 ENCOUNTER — Other Ambulatory Visit: Payer: Self-pay

## 2020-09-06 VITALS — BP 130/87 | HR 90 | Resp 18 | Ht 71.0 in | Wt 196.0 lb

## 2020-09-06 DIAGNOSIS — J1282 Pneumonia due to coronavirus disease 2019: Secondary | ICD-10-CM

## 2020-09-06 DIAGNOSIS — D509 Iron deficiency anemia, unspecified: Secondary | ICD-10-CM

## 2020-09-06 DIAGNOSIS — I10 Essential (primary) hypertension: Secondary | ICD-10-CM | POA: Diagnosis not present

## 2020-09-06 DIAGNOSIS — U071 COVID-19: Secondary | ICD-10-CM

## 2020-09-06 DIAGNOSIS — Z23 Encounter for immunization: Secondary | ICD-10-CM | POA: Diagnosis not present

## 2020-09-06 DIAGNOSIS — F325 Major depressive disorder, single episode, in full remission: Secondary | ICD-10-CM

## 2020-09-06 DIAGNOSIS — E1159 Type 2 diabetes mellitus with other circulatory complications: Secondary | ICD-10-CM | POA: Diagnosis not present

## 2020-09-06 DIAGNOSIS — E119 Type 2 diabetes mellitus without complications: Secondary | ICD-10-CM

## 2020-09-06 DIAGNOSIS — G4709 Other insomnia: Secondary | ICD-10-CM

## 2020-09-06 MED ORDER — TEMAZEPAM 30 MG PO CAPS: 30.0000 mg | ORAL_CAPSULE | Freq: Every day | ORAL | 1 refills | Status: DC

## 2020-09-06 MED ORDER — SERTRALINE HCL 100 MG PO TABS: 100.0000 mg | ORAL_TABLET | Freq: Every day | ORAL | 3 refills | Status: DC

## 2020-09-06 MED ORDER — METOPROLOL SUCCINATE ER 100 MG PO TB24: ORAL_TABLET | ORAL | 3 refills | Status: DC

## 2020-09-06 MED ORDER — METFORMIN HCL 1000 MG PO TABS: 1000.0000 mg | ORAL_TABLET | Freq: Two times a day (BID) | ORAL | 3 refills | Status: DC

## 2020-09-06 NOTE — Progress Notes (Signed)
I,[name] [name],acting as a scribe for [name] Durie, MD.,have documented all relevant documentation on the behalf of [name] Durie, MD,as directed by  [name] Durie, MD while in the presence of [name] Durie, MD.   Established patient visit   Patient: [name] [name]   DOB: 1955/09/24   65 y.o. Male  MRN: [id number] Visit Date: 09/06/2020  Today's healthcare provider: [name], MD   Chief Complaint  Patient presents with   Follow-up   Diabetes   Anemia   Hypertension   Subjective    HPI  Patient has no new issues.  He did have COVID a month ago and feels that so he is lost some lung capacity since then.  Is more dyspnea on exertion and exercise intolerance standing.  No chest pain suggestive of his previous cardiac issues.  He sees Dr. Rockey Situ next month. He has had some borderline anemia recently so we will get a check an iron panel also today.  It is time for his A1c. Diabetes Mellitus Type II, follow-up  Lab Results  Component Value Date   HGBA1C 7.6 (H) 04/12/2020   HGBA1C 7.2 (A) 12/13/2019   HGBA1C 7.7 (A) 08/17/2019   Last seen for diabetes 5 months ago.  Management since then includes continuing the same treatment. He reports good compliance with treatment. He is not having side effects. none  Home blood sugar records: fasting range: 110-190  Episodes of hypoglycemia? No none   Current insulin regiment: n/a Most Recent Eye Exam: 11/03/2019  -----------------------------------------------------------------------------------------------  Hypertension, follow-up  BP Readings from Last 3 Encounters:  09/06/20 130/87  05/24/20 (!) 132/99  04/12/20 (!) 133/93   Wt Readings from Last 3 Encounters:  09/06/20 196 lb (88.9 kg)  05/24/20 196 lb 12.8 oz (89.3 kg)  04/12/20 192 lb (87.1 kg)     He was last seen for hypertension 5 months ago.  BP at that visit was 133/93. Management since that visit includes; on metoprolol. He  reports good compliance with treatment. He is not having side effects. none He is exercising. He is adherent to low salt diet.   Outside blood pressures are 135/80.  He does not smoke.  Use of agents associated with hypertension: none.   -----------------------------------------------------------------------------------------------  Follow up for Iron deficiency anemia:  The patient was last seen for this 4 months ago. Changes made at last visit include; As long as CBC is stable and not dropping we will just treat this expectantly as he is up-to-date on his colonoscopy.  May need referral for the hemorrhoids himself. Labs were showing a little better.  He reports good compliance with treatment. He feels that condition is Improved. He is not having side effects. none  Has started taking a supplement. -----------------------------------------------------------------------------------------       Medications: Outpatient Medications Prior to Visit  Medication Sig   aspirin 81 MG tablet Take by mouth.   atorvastatin (LIPITOR) 20 MG tablet TAKE 1 TABLET BY MOUTH  DAILY AT 6 PM   cholecalciferol (VITAMIN D) 1000 UNITS tablet Take by mouth.   Coenzyme Q10 100 MG TABS Take by mouth.   DILT-XR 240 MG 24 hr capsule TAKE 1 CAPSULE(240 MG) BY MOUTH DAILY   Esomeprazole Magnesium (NEXIUM PO) Take by mouth.   ezetimibe (ZETIA) 10 MG tablet TAKE 1 TABLET BY MOUTH  DAILY   Ferrous Sulfate (IRON PO) Take by mouth.   fluticasone (FLONASE) 50 MCG/ACT nasal spray Place into the nose. Reported  on 08/14/2015   lansoprazole (PREVACID) 15 MG capsule Take by mouth.   levothyroxine (SYNTHROID) 75 MCG tablet TAKE 1 TABLET BY MOUTH  DAILY BEFORE BREAKFAST   montelukast (SINGULAIR) 10 MG tablet Take 1 tablet (10 mg total) by mouth at bedtime.   nitroGLYCERIN (NITROSTAT) 0.4 MG SL tablet Place 1 tablet (0.4 mg total) under the tongue every 5 (five) minutes as needed for chest pain.   ondansetron  (ZOFRAN-ODT) 8 MG disintegrating tablet Take by mouth. Reported on 04/25/2015   ONETOUCH ULTRA test strip CHECK BLOOD SUGAR ONCE  DAILY   pioglitazone (ACTOS) 30 MG tablet Take 1 tablet (30 mg total) by mouth daily.   pyridOXINE (VITAMIN B-6) 100 MG tablet Take 100 mg by mouth daily.   Saw Palmetto 160 MG TABS Take by mouth.   [DISCONTINUED] metFORMIN (GLUCOPHAGE) 1000 MG tablet TAKE 1 TABLET BY MOUTH  TWICE DAILY WITH MEALS   [DISCONTINUED] metoprolol succinate (TOPROL-XL) 100 MG 24 hr tablet TAKE 1 TABLET BY MOUTH  DAILY WITH OR IMMEDIATELY  FOLLOWING A MEAL   [DISCONTINUED] sertraline (ZOLOFT) 100 MG tablet Take 1 tablet (100 mg total) by mouth daily.   [DISCONTINUED] temazepam (RESTORIL) 30 MG capsule Take 1 capsule (30 mg total) by mouth at bedtime.   No facility-administered medications prior to visit.    Review of Systems  Constitutional:  Negative for appetite change, chills and fever.  Respiratory:  Negative for chest tightness, shortness of breath and wheezing.   Cardiovascular:  Negative for chest pain and palpitations.  Gastrointestinal:  Negative for abdominal pain, nausea and vomiting.       Objective    BP 130/87 (BP Location: Left Arm, Patient Position: Sitting, Cuff Size: Large)   Pulse 90   Resp 18   Ht '5\' 11"'$  (1.803 m)   Wt 196 lb (88.9 kg)   SpO2 97%   BMI 27.34 kg/m  BP Readings from Last 3 Encounters:  09/11/20 130/80  09/06/20 130/87  05/24/20 (!) 132/99   Wt Readings from Last 3 Encounters:  09/11/20 198 lb 4 oz (89.9 kg)  09/06/20 196 lb (88.9 kg)  05/24/20 196 lb 12.8 oz (89.3 kg)       Physical Exam Vitals reviewed.  Constitutional:      Appearance: Normal appearance.  HENT:     Right Ear: External ear normal.     Left Ear: External ear normal.  Eyes:     General: No scleral icterus.    Conjunctiva/sclera: Conjunctivae normal.  Cardiovascular:     Rate and Rhythm: Normal rate and regular rhythm.     Pulses: Normal pulses.     Heart  sounds: Normal heart sounds.  Pulmonary:     Effort: Pulmonary effort is normal.     Breath sounds: Normal breath sounds.  Abdominal:     Palpations: Abdomen is soft.  Neurological:     Mental Status: He is alert and oriented to person, place, and time.  Psychiatric:        Mood and Affect: Mood normal.        Behavior: Behavior normal.        Thought Content: Thought content normal.        Judgment: Judgment normal.      No results found for any visits on 09/06/20.  Assessment & Plan     1. Type 2 diabetes mellitus with other circulatory complication, without long-term current use of insulin (HCC) Goal A1c less than 7.0.  Consider adding Jardiance or  Farxiga - Hemoglobin A1c  2. Iron deficiency anemia, unspecified iron deficiency anemia type May need GI work-up if this is an issue - CBC w/Diff/Platelet - Iron, TIBC and Ferritin Panel  3. Essential (primary) hypertension  - CBC w/Diff/Platelet - Iron, TIBC and Ferritin Panel - metoprolol succinate (TOPROL-XL) 100 MG 24 hr tablet; Take with or immediately following a meal.  Dispense: 90 tablet; Refill: 3  4. Pneumonia due to COVID-19 virus Recent COVID-pneumonia.  Follow-up chest x-ray to make sure there is no secondary pneumonia. - DG Chest 2 View  5. Major depression, single episode, in complete remission ([place]) Clinically stable. - sertraline (ZOLOFT) 100 MG tablet; Take 1 tablet (100 mg total) by mouth daily.  Dispense: 90 tablet; Refill: 3  6. Well controlled diabetes mellitus ([place])  - metFORMIN (GLUCOPHAGE) 1000 MG tablet; Take 1 tablet (1,000 mg total) by mouth 2 (two) times daily with a meal.  Dispense: 180 tablet; Refill: 3  7. Need for Tdap vaccination  - Tdap vaccine greater than or equal to 7yo IM  8. Other insomnia  - temazepam (RESTORIL) 30 MG capsule; Take 1 capsule (30 mg total) by mouth at bedtime.  Dispense: 90 capsule; Refill: 1   Return in about 4 months (around 01/08/2021).      I, [name] Durie, MD, have reviewed all documentation for this visit. The documentation on 09/14/20 for the exam, diagnosis, procedures, and orders are all accurate and complete.    Ling Flesch Cranford Mon, MD  [facility] [phone] (phone) [phone] (fax)  [place]

## 2020-09-07 LAB — CBC WITH DIFFERENTIAL/PLATELET
Basophils Absolute: 0.1 10*3/uL (ref 0.0–0.2)
Basos: 1 %
EOS (ABSOLUTE): 0.1 10*3/uL (ref 0.0–0.4)
Eos: 2 %
Hematocrit: 31.8 % — ABNORMAL LOW (ref 37.5–51.0)
Hemoglobin: 9.6 g/dL — ABNORMAL LOW (ref 13.0–17.7)
Immature Grans (Abs): 0 10*3/uL (ref 0.0–0.1)
Immature Granulocytes: 0 %
Lymphocytes Absolute: 2.1 10*3/uL (ref 0.7–3.1)
Lymphs: 33 %
MCH: 24.9 pg — ABNORMAL LOW (ref 26.6–33.0)
MCHC: 30.2 g/dL — ABNORMAL LOW (ref 31.5–35.7)
MCV: 83 fL (ref 79–97)
Monocytes Absolute: 0.6 10*3/uL (ref 0.1–0.9)
Monocytes: 10 %
Neutrophils Absolute: 3.4 10*3/uL (ref 1.4–7.0)
Neutrophils: 54 %
Platelets: 244 10*3/uL (ref 150–450)
RBC: 3.85 x10E6/uL — ABNORMAL LOW (ref 4.14–5.80)
RDW: 14.8 % (ref 11.6–15.4)
WBC: 6.4 10*3/uL (ref 3.4–10.8)

## 2020-09-07 LAB — HEMOGLOBIN A1C
Est. average glucose Bld gHb Est-mCnc: 180 mg/dL
Hgb A1c MFr Bld: 7.9 % — ABNORMAL HIGH (ref 4.8–5.6)

## 2020-09-07 LAB — IRON,TIBC AND FERRITIN PANEL
Ferritin: 8 ng/mL — ABNORMAL LOW (ref 30–400)
Iron Saturation: 12 % — ABNORMAL LOW (ref 15–55)
Iron: 59 ug/dL (ref 38–169)
Total Iron Binding Capacity: 476 ug/dL — ABNORMAL HIGH (ref 250–450)
UIBC: 417 ug/dL — ABNORMAL HIGH (ref 111–343)

## 2020-09-10 NOTE — Progress Notes (Signed)
Cardiology Office Note  Date:  09/11/2020   ID:  [name], [name] November 14, 1955, MRN [id number]  PCP:  Jerrol Banana., MD   Chief Complaint  Patient presents with   New Patient (Initial Visit)    Establish care for CAD; s/p stent placement 2008 at [facility] ([place] Patient c/o shortness of breath with some chest discomfort; symptoms since May, 2022. Medications reviewed by the patient verbally.     HPI:  [name] [name] is a 65 y.o. 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 Lives in [place]  Some SOB and chest tightness, over the past 6 months  Reports having a  "virus" in May 2022 Recovered been with Covid one month ago, 07/2020 SOB with over exertion seems to be worse since then Also some tightness on left Stops and rests, sx then go away  Angina sx in 2008, SOB At time of NSTEMI had chest pain  Labs reviewed A1C 7.9 Total chol 126, LDL 41  [place] 2019 brief SVT episodes (30 total, longest 14 beats) without associated patient triggered events  EKG personally reviewed by myself on todays visit Shows normal sinus rhythm rate 85 bpm no significant ST-T wave changes   PMH:   has a past medical history of A-fib ([place]), Coronary artery disease, Diabetes mellitus without complication ([place]), Hyperlipidemia, Hypertension, Raynaud's disease, and Thyroid disease.  PSH:    Past Surgical History:  Procedure Laterality Date   CARDIAC CATHETERIZATION     CARDIAC SURGERY  05/30/2005   stent placement    Current Outpatient Medications  Medication Sig Dispense Refill   aspirin 81 MG tablet Take by mouth.     atorvastatin (LIPITOR)  20 MG tablet TAKE 1 TABLET BY MOUTH  DAILY AT 6 PM 90 tablet 3   cholecalciferol (VITAMIN D) 1000 UNITS tablet Take by mouth.     Coenzyme Q10 100 MG TABS Take by mouth.     ezetimibe (ZETIA) 10 MG tablet TAKE 1 TABLET BY MOUTH  DAILY 90 tablet 3   Ferrous Sulfate (IRON PO) Take by mouth.     levothyroxine (SYNTHROID) 75 MCG tablet TAKE 1 TABLET BY MOUTH  DAILY BEFORE BREAKFAST 90 tablet 3   metFORMIN (GLUCOPHAGE) 1000 MG tablet Take 1 tablet (1,000 mg total) by mouth 2 (two) times daily with a meal. 180 tablet 3   metoprolol succinate (TOPROL-XL) 100 MG 24 hr tablet Take with or immediately following a meal. 90 tablet 3   nitroGLYCERIN (NITROSTAT) 0.4 MG SL tablet Place 1 tablet (0.4 mg total) under the tongue every 5 (five) minutes as needed for chest pain. 100 tablet 5   ONETOUCH ULTRA test strip CHECK BLOOD SUGAR ONCE  DAILY 100 strip 3   pioglitazone (ACTOS) 30 MG tablet Take 1 tablet (30 mg total) by mouth daily. 30 tablet 12   pyridOXINE (VITAMIN B-6) 100 MG tablet Take 100 mg by mouth daily.     sertraline (ZOLOFT) 100 MG tablet Take 1 tablet (100 mg total) by mouth daily. 90 tablet 3   temazepam (RESTORIL) 30 MG capsule Take 1 capsule (30 mg total) by mouth at bedtime. 90 capsule 1   fluticasone (  FLONASE) 50 MCG/ACT nasal spray Place into the nose. Reported on 08/14/2015 (Patient not taking: Reported on 09/11/2020)     lansoprazole (PREVACID) 15 MG capsule Take by mouth. (Patient not taking: Reported on 09/11/2020)     montelukast (SINGULAIR) 10 MG tablet Take 1 tablet (10 mg total) by mouth at bedtime. (Patient not taking: Reported on 09/11/2020) 90 tablet 3   ondansetron (ZOFRAN-ODT) 8 MG disintegrating tablet Take by mouth. Reported on 04/25/2015 (Patient not taking: Reported on 09/11/2020)     Saw Palmetto 160 MG TABS Take by mouth. (Patient not taking: Reported on 09/11/2020)     No current facility-administered medications for this visit.     Allergies:   Enalapril   Social History:   The patient  reports that he has never smoked. He has never used smokeless tobacco. He reports current alcohol use. He reports that he does not use drugs.   Family History:   family history includes Diabetes in his mother; Glaucoma in his mother; Gout in his mother; Heart disease in his father and mother; Hypertension in his father, mother, sister, and sister; Prostate cancer in his father; Skin cancer in his mother; Ulcers in his mother.    Review of Systems: Review of Systems  Constitutional: Negative.   HENT: Negative.    Respiratory:  Positive for shortness of breath.   Cardiovascular:  Positive for chest pain.  Gastrointestinal: Negative.   Musculoskeletal: Negative.   Neurological: Negative.   Psychiatric/Behavioral: Negative.    All other systems reviewed and are negative.   PHYSICAL EXAM: VS:  BP 130/80 (BP Location: Right Arm, Patient Position: Sitting, Cuff Size: Normal)   Pulse 85   Ht '5\' 11"'$  (1.803 m)   Wt 198 lb 4 oz (89.9 kg)   SpO2 98%   BMI 27.65 kg/m  , BMI Body mass index is 27.65 kg/m. GEN: Well nourished, well developed, in no acute distress HEENT: normal Neck: no JVD, carotid bruits, or masses Cardiac: RRR; no murmurs, rubs, or gallops,no edema  Respiratory:  clear to auscultation bilaterally, normal work of breathing GI: soft, nontender, nondistended, + BS MS: no deformity or atrophy Skin: warm and dry, no rash Neuro:  Strength and sensation are intact Psych: euthymic mood, full affect    Recent Labs: 04/12/2020: ALT 29; BUN 16; Creatinine, Ser 0.97; Potassium 4.7; Sodium 140; TSH 3.520 09/06/2020: Hemoglobin 9.6; Platelets 244    Lipid Panel Lab Results  Component Value Date   CHOL 126 04/12/2020   HDL 57 04/12/2020   LDLCALC 41 04/12/2020   TRIG 171 (H) 04/12/2020      Wt Readings from Last 3 Encounters:  09/11/20 198 lb 4 oz (89.9 kg)  09/06/20 196 lb (88.9 kg)  05/24/20 196 lb 12.8 oz (89.3 kg)       ASSESSMENT AND  PLAN:  Problem List Items Addressed This Visit       Cardiology Problems   Coronary artery disease involving native coronary artery of native heart without angina pectoris - Primary   Essential (primary) hypertension   HLD (hyperlipidemia)   Other Visit Diagnoses     Diabetes mellitus type 2 with complications ([place])          Coronary disease with stable angina Having worsening shortness of breath, chest pain Some atypical features with his chest pain Shortness of breath worse after COVID, unable to exclude conditioning, heat Need to consider ischemia Will recommend we start with echocardiogram Give it more time for recovery following COVID last  month Discussed options for ischemic work-up including stress testing Cardiac CTA would be less beneficial given prior stent  Hyperlipidemia Cholesterol is at goal on the current lipid regimen. No changes to the medications were made.  Diabetes type 2 with complications We have encouraged continued exercise, careful diet management in an effort to lose weight. He will talk with Dr. Rosanna Randy to see if there are other medication options available   Total encounter time more than 60 minutes  Greater than 50% was spent in counseling and coordination of care with the patient  Patient seen in consultation for Dr. Rosanna Randy will be referred back to his office for ongoing care of the issues detailed above  Signed, Esmond Plants, M.D., Ph.D. [place], [place]

## 2020-09-11 ENCOUNTER — Ambulatory Visit (INDEPENDENT_AMBULATORY_CARE_PROVIDER_SITE_OTHER): Payer: 59 | Admitting: Cardiovascular Disease

## 2020-09-11 ENCOUNTER — Encounter: Payer: Self-pay | Admitting: Cardiovascular Disease

## 2020-09-11 ENCOUNTER — Telehealth: Payer: Self-pay

## 2020-09-11 ENCOUNTER — Other Ambulatory Visit: Payer: Self-pay

## 2020-09-11 VITALS — BP 130/80 | HR 85 | Ht 71.0 in | Wt 198.2 lb

## 2020-09-11 DIAGNOSIS — I25118 Atherosclerotic heart disease of native coronary artery with other forms of angina pectoris: Secondary | ICD-10-CM

## 2020-09-11 DIAGNOSIS — E782 Mixed hyperlipidemia: Secondary | ICD-10-CM

## 2020-09-11 DIAGNOSIS — E118 Type 2 diabetes mellitus with unspecified complications: Secondary | ICD-10-CM

## 2020-09-11 DIAGNOSIS — R0602 Shortness of breath: Secondary | ICD-10-CM

## 2020-09-11 DIAGNOSIS — I10 Essential (primary) hypertension: Secondary | ICD-10-CM

## 2020-09-11 MED ORDER — NITROGLYCERIN 0.4 MG SL SUBL: 0.4000 mg | SUBLINGUAL_TABLET | SUBLINGUAL | 1 refills | Status: DC | PRN

## 2020-09-11 MED ORDER — EMPAGLIFLOZIN 10 MG PO TABS: 10.0000 mg | ORAL_TABLET | Freq: Every day | ORAL | 0 refills | Status: DC

## 2020-09-11 NOTE — Telephone Encounter (Signed)
Patient is wanting his lab result from last week.      He is especially concerned with Hbg A1C.    He said if it is up, he would like to try to go back on Janumet or Jaridiance (if his insurance will cover it).

## 2020-09-11 NOTE — Patient Instructions (Addendum)
Medication Instructions:  No changes  If you need a refill on your cardiac medications before your next appointment, please call your pharmacy.   Lab work: No new labs needed  Testing/Procedures: ECHO  Your physician has requested that you have an echocardiogram. Echocardiography is a painless test that uses sound waves to create images of your heart. It provides your doctor with information about the size and shape of your heart and how well your heart's chambers and valves are working. This procedure takes approximately one hour. There are no restrictions for this procedure.  There is a possibility that an IV may need to be started during your test to inject an image enhancing agent. This is done to obtain more optimal pictures of your heart. Therefore we ask that you do at least drink some water prior to coming in to hydrate your veins.    Follow-Up: At [facility], you and your health needs are our priority.  As part of our continuing mission to provide you with exceptional heart care, we have created designated Provider Care Teams.  These Care Teams include your primary Cardiologist (physician) and Advanced Practice Providers (APPs -  Physician Assistants and Nurse Practitioners) who all work together to provide you with the care you need, when you need it.  You will need to set up a separate MyChart account with [facility], may link with [facility] after set up  We recommend signing up for the patient portal called "MyChart".  Sign up information is provided on this After Visit Summary.  MyChart is used to connect with patients for Virtual Visits (Telemedicine).  Patients are able to view lab/test results, encounter notes, upcoming appointments, etc.  Non-urgent messages can be sent to your provider as well.   To learn more about what you can do with MyChart, go to [email or web address].    You will need a follow up appointment in 6 months  Providers on your designated Care Team:    Murray Hodgkins, NP Christell Faith, PA-C Marrianne Mood, PA-C [place], [place]  COVID-19 Vaccine Information can be found at: ShippingScam.co.uk For questions related to vaccine distribution or appointments, please email [email or web address] or call [phone].

## 2020-09-21 ENCOUNTER — Other Ambulatory Visit: Payer: Self-pay | Admitting: Family Medicine

## 2020-09-21 DIAGNOSIS — E119 Type 2 diabetes mellitus without complications: Secondary | ICD-10-CM

## 2020-09-21 NOTE — Telephone Encounter (Signed)
Requested Prescriptions  Pending Prescriptions Disp Refills  . ONETOUCH ULTRA test strip [employer] Med Name: OneTouch Ultra In Vitro Strip] 100 strip 3    Sig: [place]  DAILY     Endocrinology: Diabetes - Testing Supplies Passed - 09/21/2020 12:29 AM      Passed - Valid encounter within last 12 months    Recent Outpatient Visits          2 weeks ago Type 2 diabetes mellitus with other circulatory complication, without long-term current use of insulin [facility])   [facility] Jerrol Banana., MD   4 months ago Iron deficiency anemia, unspecified iron deficiency anemia type   [facility] Jerrol Banana., MD   5 months ago Annual physical exam   [facility] Jerrol Banana., MD   9 months ago Type 2 diabetes mellitus with other circulatory complication, without long-term current use of insulin [facility])   [facility] Jerrol Banana., MD   1 year ago Type 2 diabetes mellitus with other circulatory complication, without long-term current use of insulin [facility])   [facility] Jerrol Banana., MD      Future Appointments            In 3 months Jerrol Banana., MD [facility], [facility]   In 6 months Gollan, Kathlene November, MD [facility], [facility]

## 2020-10-03 ENCOUNTER — Ambulatory Visit (INDEPENDENT_AMBULATORY_CARE_PROVIDER_SITE_OTHER): Payer: 59

## 2020-10-03 ENCOUNTER — Other Ambulatory Visit: Payer: Self-pay

## 2020-10-03 DIAGNOSIS — I25118 Atherosclerotic heart disease of native coronary artery with other forms of angina pectoris: Secondary | ICD-10-CM | POA: Diagnosis not present

## 2020-10-03 DIAGNOSIS — R0602 Shortness of breath: Secondary | ICD-10-CM

## 2020-10-03 LAB — ECHOCARDIOGRAM COMPLETE
AR max vel: 3.77 cm2
AV Area VTI: 3.78 cm2
AV Area mean vel: 3.73 cm2
AV Mean grad: 4 mmHg
AV Peak grad: 7.6 mmHg
Ao pk vel: 1.38 m/s
Area-P 1/2: 3.76 cm2
Calc EF: 66.6 %
MV M vel: 5.48 m/s
MV Peak grad: 120.3 mmHg
S' Lateral: 2.4 cm
Single Plane A2C EF: 75.6 %
Single Plane A4C EF: 57.2 %

## 2020-10-06 ENCOUNTER — Other Ambulatory Visit: Payer: Self-pay | Admitting: Family Medicine

## 2020-10-06 DIAGNOSIS — F325 Major depressive disorder, single episode, in full remission: Secondary | ICD-10-CM

## 2020-10-06 DIAGNOSIS — G4709 Other insomnia: Secondary | ICD-10-CM

## 2020-10-07 NOTE — Telephone Encounter (Signed)
Requested medications are due for refill today.  Seems a bit soon  Requested medications are on the active medications list.  yes  Last refill. 09/06/2020  Future visit scheduled.   yes  Notes to clinic.  Medication not delegated.

## 2020-10-07 NOTE — Telephone Encounter (Signed)
Requested Prescriptions  Pending Prescriptions Disp Refills  . temazepam (RESTORIL) 30 MG capsule [Pharmacy Med Name: TEMAZEPAM  '30MG'$   CAP] 90 capsule     Sig: TAKE 1 CAPSULE BY MOUTH AT  BEDTIME     Not Delegated - Psychiatry:  Anxiolytics/Hypnotics Failed - 10/06/2020  2:36 PM      Failed - This refill cannot be delegated      Failed - Urine Drug Screen completed in last 360 days      Passed - Valid encounter within last 6 months    Recent Outpatient Visits          1 month ago Type 2 diabetes mellitus with other circulatory complication, without long-term current use of insulin [facility])   [facility] Jerrol Banana., MD   4 months ago Iron deficiency anemia, unspecified iron deficiency anemia type   [facility] Jerrol Banana., MD   5 months ago Annual physical exam   [facility] Jerrol Banana., MD   9 months ago Type 2 diabetes mellitus with other circulatory complication, without long-term current use of insulin [facility])   [facility] Jerrol Banana., MD   1 year ago Type 2 diabetes mellitus with other circulatory complication, without long-term current use of insulin [facility])   [facility] Jerrol Banana., MD      Future Appointments            In 3 months Jerrol Banana., MD [facility], [place]   In 5 months Gollan, Kathlene November, MD [facility], [facility]           . sertraline (ZOLOFT) 100 MG tablet [Pharmacy Med Name: Sertraline HCl 100 MG Oral Tablet] 90 tablet 1    Sig: TAKE 1 TABLET BY MOUTH  DAILY     Psychiatry:  Antidepressants - SSRI Passed - 10/06/2020  2:36 PM      Passed - Completed PHQ-2 or PHQ-9 in the last 360 days      Passed - Valid encounter within last 6 months    Recent Outpatient Visits          1 month ago Type 2 diabetes mellitus with other circulatory complication, without long-term current use of insulin [facility])    [facility] Jerrol Banana., MD   4 months ago Iron deficiency anemia, unspecified iron deficiency anemia type   [facility] Jerrol Banana., MD   5 months ago Annual physical exam   [facility] Jerrol Banana., MD   9 months ago Type 2 diabetes mellitus with other circulatory complication, without long-term current use of insulin [facility])   [facility] Jerrol Banana., MD   1 year ago Type 2 diabetes mellitus with other circulatory complication, without long-term current use of insulin [facility])   [facility] Jerrol Banana., MD      Future Appointments            In 3 months Jerrol Banana., MD [facility], [place]   In 5 months Gollan, Kathlene November, MD [facility], [facility]

## 2020-10-09 ENCOUNTER — Telehealth: Payer: Self-pay | Admitting: Cardiovascular Disease

## 2020-10-09 NOTE — Telephone Encounter (Signed)
Was able to reach out to Mr. 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 Rockey Situ has not reviewed results of his past ECHO, results read by radiologist and are in Dr. Donivan Scull basket.  Advised will call back soon as the results have been interpreted. Wife thankful for the return call and update.

## 2020-10-09 NOTE — Telephone Encounter (Signed)
Patient calling to check the status of results from Echo 09/06

## 2020-10-10 ENCOUNTER — Telehealth: Payer: Self-pay

## 2020-10-10 NOTE — Telephone Encounter (Signed)
Able to reach pt's wife [name] [name] regarding pt's recent ECHO Dr. Rockey Situ had a chance to review his results and advised   "Echocardiogram  Normal left ventricular function, right ventricle low normal function  No significant valve disease  Overall looks good "  [name] [name] very thankful for the phone call of her results, will let Mr. Loeza know of the results as he is out checking cows, all questions and concerns were address with nothing further at this time. Will see at next schedule f/u appt.

## 2020-10-11 NOTE — Telephone Encounter (Signed)
Pt calling in regards to the Temazepam prescription. He states that he is almost out and is requesting to have this sent in Today. Please advise.

## 2020-10-12 ENCOUNTER — Telehealth: Payer: Self-pay

## 2020-10-12 DIAGNOSIS — G4709 Other insomnia: Secondary | ICD-10-CM

## 2020-10-12 NOTE — Telephone Encounter (Signed)
Patient is requesting refill on Restoril to be sent to [facility] mail order. He is picking up some at [facility] today since he is out.

## 2020-10-12 NOTE — Telephone Encounter (Signed)
Please advise? Patient wants this sent to [facility].

## 2020-10-13 NOTE — Telephone Encounter (Signed)
Medication was [facility] on 10/12/20.

## 2020-11-01 ENCOUNTER — Other Ambulatory Visit: Payer: Self-pay | Admitting: *Deleted

## 2020-11-01 DIAGNOSIS — E119 Type 2 diabetes mellitus without complications: Secondary | ICD-10-CM

## 2020-11-01 MED ORDER — PIOGLITAZONE HCL 30 MG PO TABS
30.0000 mg | ORAL_TABLET | Freq: Every day | ORAL | 3 refills | Status: DC
Start: 2020-11-01 — End: 2021-06-15

## 2020-12-11 ENCOUNTER — Other Ambulatory Visit: Payer: Self-pay | Admitting: Family Medicine

## 2020-12-11 NOTE — Telephone Encounter (Signed)
Requested Prescriptions  Pending Prescriptions Disp Refills  . JARDIANCE 10 MG TABS tablet [Pharmacy Med Name: JARDIANCE 10MG TABLETS] 90 tablet 0    Sig: TAKE 1 TABLET(10 MG) BY MOUTH DAILY BEFORE AND BREAKFAST     Endocrinology:  Diabetes - SGLT2 Inhibitors Passed - 12/11/2020  9:53 AM      Passed - Cr in normal range and within 360 days    Creatinine, Ser  Date Value Ref Range Status  04/12/2020 0.97 0.76 - 1.27 mg/dL Final         Passed - LDL in normal range and within 360 days    LDL Chol Calc (NIH)  Date Value Ref Range Status  04/12/2020 41 0 - 99 mg/dL Final         Passed - HBA1C is between 0 and 7.9 and within 180 days    Hgb A1c MFr Bld  Date Value Ref Range Status  09/06/2020 7.9 (H) 4.8 - 5.6 % Final    Comment:             Prediabetes: 5.7 - 6.4          Diabetes: >6.4          Glycemic control for adults with diabetes: <7.0          Passed - eGFR in normal range and within 360 days    GFR calc Af Amer  Date Value Ref Range Status  04/14/2019 96 >59 mL/min/1.73 Final   GFR calc non Af Amer  Date Value Ref Range Status  04/14/2019 83 >59 mL/min/1.73 Final   eGFR  Date Value Ref Range Status  04/12/2020 87 >59 mL/min/1.73 Final         Passed - Valid encounter within last 6 months    Recent Outpatient Visits          3 months ago Type 2 diabetes mellitus with other circulatory complication, without long-term current use of insulin ([place])   [facility] Jerrol Banana., MD   6 months ago Iron deficiency anemia, unspecified iron deficiency anemia type   [facility] Jerrol Banana., MD   8 months ago Annual physical exam   [facility] Jerrol Banana., MD   12 months ago Type 2 diabetes mellitus with other circulatory complication, without long-term current use of insulin [facility])   [facility] Jerrol Banana., MD   1 year ago Type 2 diabetes mellitus with other  circulatory complication, without long-term current use of insulin [facility])   [facility] Jerrol Banana., MD      Future Appointments            In 4 weeks Jerrol Banana., MD [facility], [place]   In 3 months Gollan, Kathlene November, MD [facility], [facility]

## 2020-12-11 NOTE — Telephone Encounter (Signed)
Medication Refill - Medication:  empagliflozin (JARDIANCE) 10 MG TABS tablet   Has the patient contacted their pharmacy? Yes.   Contact PCP  Preferred Pharmacy (with phone number or street name):  [id number] [name], [address]  [place], [place] [address]  Phone:  [phone]  Fax:  [phone]   Has the patient been seen for an appointment in the last year OR does the patient have an upcoming appointment? Yes.  12/12  Agent: Please be advised that RX refills may take up to 3 business days. We ask that you follow-up with your pharmacy.

## 2020-12-12 NOTE — Telephone Encounter (Signed)
[facility]ed pharmacy. Refill completed yesterday 12/11/20 ad ready for pick up.

## 2021-01-08 ENCOUNTER — Ambulatory Visit: Payer: Self-pay | Admitting: Family Medicine

## 2021-01-09 ENCOUNTER — Encounter: Payer: Self-pay | Admitting: Family Medicine

## 2021-01-09 ENCOUNTER — Other Ambulatory Visit: Payer: Self-pay

## 2021-01-09 ENCOUNTER — Ambulatory Visit (INDEPENDENT_AMBULATORY_CARE_PROVIDER_SITE_OTHER): Payer: Medicare Other | Admitting: Family Medicine

## 2021-01-09 VITALS — BP 108/74 | HR 90 | Temp 98.0°F | Resp 16 | Ht 71.0 in | Wt 190.0 lb

## 2021-01-09 DIAGNOSIS — E1159 Type 2 diabetes mellitus with other circulatory complications: Secondary | ICD-10-CM | POA: Diagnosis not present

## 2021-01-09 DIAGNOSIS — I25118 Atherosclerotic heart disease of native coronary artery with other forms of angina pectoris: Secondary | ICD-10-CM

## 2021-01-09 DIAGNOSIS — D649 Anemia, unspecified: Secondary | ICD-10-CM | POA: Diagnosis not present

## 2021-01-09 DIAGNOSIS — E119 Type 2 diabetes mellitus without complications: Secondary | ICD-10-CM

## 2021-01-09 DIAGNOSIS — Z23 Encounter for immunization: Secondary | ICD-10-CM

## 2021-01-09 DIAGNOSIS — E782 Mixed hyperlipidemia: Secondary | ICD-10-CM

## 2021-01-09 DIAGNOSIS — K921 Melena: Secondary | ICD-10-CM

## 2021-01-09 DIAGNOSIS — E039 Hypothyroidism, unspecified: Secondary | ICD-10-CM | POA: Diagnosis not present

## 2021-01-09 DIAGNOSIS — Z1159 Encounter for screening for other viral diseases: Secondary | ICD-10-CM | POA: Diagnosis not present

## 2021-01-09 DIAGNOSIS — I10 Essential (primary) hypertension: Secondary | ICD-10-CM

## 2021-01-09 DIAGNOSIS — F320 Major depressive disorder, single episode, mild: Secondary | ICD-10-CM

## 2021-01-09 DIAGNOSIS — Z8679 Personal history of other diseases of the circulatory system: Secondary | ICD-10-CM

## 2021-01-09 LAB — POCT GLYCOSYLATED HEMOGLOBIN (HGB A1C)
Est. average glucose Bld gHb Est-mCnc: 171
Hemoglobin A1C: 7.6 % — AB (ref 4.0–5.6)

## 2021-01-09 NOTE — Assessment & Plan Note (Signed)
With prior GI blood loss, no longer symptomatic and taking oral iron. Will recheck Hb. Refer to GI for updated colonoscopy.

## 2021-01-09 NOTE — Assessment & Plan Note (Signed)
Continue to control lipids, BP. Continue to follow with Cardiology.

## 2021-01-09 NOTE — Assessment & Plan Note (Addendum)
RRR today. Continue to monitor. Continue to follow with Cardiology.

## 2021-01-09 NOTE — Assessment & Plan Note (Signed)
Recheck labs 

## 2021-01-09 NOTE — Assessment & Plan Note (Signed)
A1c 7.6 today, goal <8. No changes to medications today. PCV given today. Foot exam normal. Obtaining labs and urine microalbumin today.

## 2021-01-09 NOTE — Patient Instructions (Addendum)
It was great to see you!  Our plans for today:  - Cut your sertraline in half. No changes to your other medications.  - Check with the pharmacy about your COVID booster and Shingles vaccine.  - Somebody will call you about the GI appointment.   We are checking some labs today, we will call you with these results.  Take care and seek immediate care sooner if you develop any concerns.   Dr. Ky Barban

## 2021-01-09 NOTE — Assessment & Plan Note (Signed)
Tolerant of statin. Last lipids within goal.

## 2021-01-09 NOTE — Progress Notes (Signed)
   SUBJECTIVE:   CHIEF COMPLAINT / HPI:   Hypertension, CAD with stable angina, H/o Afib: - Medications: metoprolol, ASA, lipitor, zetia, NTG prn - follows with Dr. Rockey Situ, f/u in February. - prior lip swelling with ACEI - denies h/o afib. - hasn't had to use NTG - Compliance: good - Checking BP at home: yes, 130s SBP - Denies any vision changes, LE edema, medication SEs, or symptoms of hypotension - Exercise: stays active farming  Diabetes, Type 2 - Last A1c 7.9 08/2020 - Medications: metformin, jardiance, actos - Compliance: good - Checking BG at home: yes, 128-200 - Eye exam: October 2022 - Foot exam: due - Microalbumin: due - Statin: yes - PNA vaccine: due - Denies symptoms of hypoglycemia, polyuria, polydipsia, foot ulcers/trauma - occasional paresthesias  HLD - medications: lipitor, zetia - compliance: good - medication SEs: none  Hypothyroidism - Medications: Synthroid 79mcg - Current symptoms:  none - Denies change in energy level, diarrhea, nervousness, palpitations, and weight changes - Symptoms have been well-controlled  Depression - Medications: sertraline - Taking: good compliance - initially had depression with diagnosis of diabetes and hypothyroidism.  - Symptoms: none - Current stressors: mom with dementia and falling.   Depression screen [facility] 2/9 01/09/2021 09/06/2020 04/12/2020  Decreased Interest 0 1 0  Down, Depressed, Hopeless 0 1 1  PHQ - 2 Score 0 2 1  Altered sleeping 0 0 0  Tired, decreased energy 0 2 0  Change in appetite 1 0 0  Feeling bad or failure about yourself  0 0 0  Trouble concentrating 0 0 -  Moving slowly or fidgety/restless 0 0 0  Suicidal thoughts 0 0 0  PHQ-9 Score 1 4 1   Difficult doing work/chores Not difficult at all Not difficult at all Not difficult at all  Some recent data might be hidden    Normocytic Anemia - Hb 9.6 08/2020 with low iron, ferritin, elevated TIBC. Taking iron. Noticed more energy with taking  iron. Previously some blood in stool, now resolved. Hemorrhoid on colonoscopy 2015.  OBJECTIVE:   BP 108/74 (BP Location: Right Arm, Patient Position: Sitting, Cuff Size: Large)   Pulse 90   Temp 98 F (36.7 C) (Temporal)   Resp 16   Ht 5\' 11"  (1.803 m)   Wt 190 lb (86.2 kg)   SpO2 98%   BMI 26.50 kg/m   Gen: well appearing, in NAD Card: RRR Lungs: CTAB Ext: WWP, no edema. Foot exam wnl, see screening documentation for details.    ASSESSMENT/PLAN:   Essential (primary) hypertension Low normal but without orthostatic symptoms, no changes today. obtaining labs.   History of atrial fibrillation RRR today. Continue to monitor. Continue to follow with Cardiology.  Coronary artery disease of native artery of native heart with stable angina pectoris ([place]) Continue to control lipids, BP. Continue to follow with Cardiology.  Adult hypothyroidism Recheck labs.  HLD (hyperlipidemia) Tolerant of statin. Last lipids within goal.  Major depressive disorder, single episode, mild ([place]) Doing well. Wants to reduce pill burden, ok to trial dose decrease of sertraline. F/u in 4 months, sooner if needed.   Well controlled diabetes mellitus ([place]) A1c 7.6 today, goal <8. No changes to medications today. PCV given today. Foot exam normal. Obtaining labs and urine microalbumin today.   Normocytic anemia With prior GI blood loss, no longer symptomatic and taking oral iron. Will recheck Hb. Refer to GI for updated colonoscopy.     Myles Gip, DO

## 2021-01-09 NOTE — Assessment & Plan Note (Signed)
Doing well. Wants to reduce pill burden, ok to trial dose decrease of sertraline. F/u in 4 months, sooner if needed.

## 2021-01-09 NOTE — Assessment & Plan Note (Signed)
Low normal but without orthostatic symptoms, no changes today. obtaining labs.

## 2021-01-10 LAB — BASIC METABOLIC PANEL
BUN/Creatinine Ratio: 14 (ref 10–24)
BUN: 16 mg/dL (ref 8–27)
CO2: 21 mmol/L (ref 20–29)
Calcium: 10.4 mg/dL — ABNORMAL HIGH (ref 8.6–10.2)
Chloride: 97 mmol/L (ref 96–106)
Creatinine, Ser: 1.14 mg/dL (ref 0.76–1.27)
Glucose: 136 mg/dL — ABNORMAL HIGH (ref 70–99)
Potassium: 4.7 mmol/L (ref 3.5–5.2)
Sodium: 138 mmol/L (ref 134–144)
eGFR: 71 mL/min/{1.73_m2} (ref >59)

## 2021-01-10 LAB — CBC
Hematocrit: 41 % (ref 37.5–51.0)
Hemoglobin: 12 g/dL — ABNORMAL LOW (ref 13.0–17.7)
MCH: 22.2 pg — ABNORMAL LOW (ref 26.6–33.0)
MCHC: 29.3 g/dL — ABNORMAL LOW (ref 31.5–35.7)
MCV: 76 fL — ABNORMAL LOW (ref 79–97)
Platelets: 271 10*3/uL (ref 150–450)
RBC: 5.41 x10E6/uL (ref 4.14–5.80)
RDW: 17 % — ABNORMAL HIGH (ref 11.6–15.4)
WBC: 6.6 10*3/uL (ref 3.4–10.8)

## 2021-01-10 LAB — MICROALBUMIN / CREATININE URINE RATIO
Creatinine, Urine: 74.9 mg/dL
Microalb/Creat Ratio: 7 mg/g creat (ref 0–29)
Microalbumin, Urine: 4.9 ug/mL

## 2021-01-10 LAB — TSH: TSH: 4.1 u[IU]/mL (ref 0.450–4.500)

## 2021-01-10 LAB — HEPATITIS C ANTIBODY: Hep C Virus Ab: <0.1 s/co ratio (ref 0.0–0.9)

## 2021-02-20 ENCOUNTER — Other Ambulatory Visit: Payer: Self-pay | Admitting: Family Medicine

## 2021-02-20 DIAGNOSIS — F325 Major depressive disorder, single episode, in full remission: Secondary | ICD-10-CM

## 2021-02-21 NOTE — Telephone Encounter (Signed)
Requested Prescriptions  Pending Prescriptions Disp Refills   sertraline (ZOLOFT) 100 MG tablet [Pharmacy Med Name: Sertraline HCl 100 MG Oral Tablet] 90 tablet 1    Sig: TAKE 1 TABLET BY MOUTH  DAILY     Psychiatry:  Antidepressants - SSRI Passed - 02/20/2021 11:17 PM      Passed - Completed PHQ-2 or PHQ-9 in the last 360 days      Passed - Valid encounter within last 6 months    Recent Outpatient Visits          1 month ago Type 2 diabetes mellitus with other circulatory complication, without long-term current use of insulin [facility])   [facility] Rory Percy M, DO   5 months ago Type 2 diabetes mellitus with other circulatory complication, without long-term current use of insulin [facility])   [facility] Jerrol Banana., MD   9 months ago Iron deficiency anemia, unspecified iron deficiency anemia type   [facility] Jerrol Banana., MD   10 months ago Annual physical exam   [facility] Jerrol Banana., MD   1 year ago Type 2 diabetes mellitus with other circulatory complication, without long-term current use of insulin [facility])   [facility] Jerrol Banana., MD      Future Appointments            In 3 weeks Gollan, Kathlene November, MD [facility], [facility]   In 3 months Jerrol Banana., MD [facility], [facility]

## 2021-02-27 ENCOUNTER — Other Ambulatory Visit: Payer: Self-pay

## 2021-02-27 DIAGNOSIS — G4709 Other insomnia: Secondary | ICD-10-CM

## 2021-02-27 NOTE — Telephone Encounter (Signed)
[facility] cannot get Temazepam so this needs to be sent into [facility] in [place].. Temazepam 30 mg. And patient is almost out

## 2021-02-28 NOTE — Addendum Note (Signed)
Addended by: Julieta Bellini on: 02/28/2021 10:29 AM   Modules accepted: Orders

## 2021-03-02 MED ORDER — TEMAZEPAM 30 MG PO CAPS: 30.0000 mg | ORAL_CAPSULE | Freq: Every day | ORAL | 1 refills | Status: DC

## 2021-03-07 ENCOUNTER — Other Ambulatory Visit: Payer: Self-pay | Admitting: Family Medicine

## 2021-03-07 NOTE — Telephone Encounter (Signed)
Requested Prescriptions  Pending Prescriptions Disp Refills   JARDIANCE 10 MG TABS tablet [Pharmacy Med Name: JARDIANCE 10MG TABLETS] 90 tablet 0    Sig: TAKE 1 TABLET(10 MG) BY MOUTH DAILY BREAKFAST     Endocrinology:  Diabetes - SGLT2 Inhibitors Passed - 03/07/2021 10:26 AM      Passed - Cr in normal range and within 360 days    Creatinine, Ser  Date Value Ref Range Status  01/09/2021 1.14 0.76 - 1.27 mg/dL Final         Passed - HBA1C is between 0 and 7.9 and within 180 days    Hemoglobin A1C  Date Value Ref Range Status  01/09/2021 7.6 (A) 4.0 - 5.6 % Final   Hgb A1c MFr Bld  Date Value Ref Range Status  09/06/2020 7.9 (H) 4.8 - 5.6 % Final    Comment:             Prediabetes: 5.7 - 6.4          Diabetes: >6.4          Glycemic control for adults with diabetes: <7.0          Passed - eGFR in normal range and within 360 days    GFR calc Af Amer  Date Value Ref Range Status  04/14/2019 96 >59 mL/min/1.73 Final   GFR calc non Af Amer  Date Value Ref Range Status  04/14/2019 83 >59 mL/min/1.73 Final   eGFR  Date Value Ref Range Status  01/09/2021 71 >59 mL/min/1.73 Final         Passed - Valid encounter within last 6 months    Recent Outpatient Visits          1 month ago Type 2 diabetes mellitus with other circulatory complication, without long-term current use of insulin [facility])   [facility] Myles Gip, DO   6 months ago Type 2 diabetes mellitus with other circulatory complication, without long-term current use of insulin ([place])   [facility] Jerrol Banana., MD   9 months ago Iron deficiency anemia, unspecified iron deficiency anemia type   [facility] Jerrol Banana., MD   10 months ago Annual physical exam   [facility] Jerrol Banana., MD   1 year ago Type 2 diabetes mellitus with other circulatory complication, without long-term current use of insulin [facility])   [facility] Jerrol Banana., MD      Future Appointments            Tomorrow Jerrol Banana., MD [facility], [facility]   In 1 week Gollan, Kathlene November, MD [facility], [facility]   In 2 months Jerrol Banana., MD [facility], [place]

## 2021-03-08 ENCOUNTER — Other Ambulatory Visit: Payer: Self-pay

## 2021-03-08 ENCOUNTER — Encounter: Payer: Self-pay | Admitting: Family Medicine

## 2021-03-08 ENCOUNTER — Ambulatory Visit (INDEPENDENT_AMBULATORY_CARE_PROVIDER_SITE_OTHER): Payer: Medicare Other | Admitting: Family Medicine

## 2021-03-08 VITALS — BP 124/98 | HR 87 | Temp 98.0°F | Resp 14 | Wt 188.0 lb

## 2021-03-08 DIAGNOSIS — F320 Major depressive disorder, single episode, mild: Secondary | ICD-10-CM

## 2021-03-08 DIAGNOSIS — E039 Hypothyroidism, unspecified: Secondary | ICD-10-CM

## 2021-03-08 DIAGNOSIS — E1159 Type 2 diabetes mellitus with other circulatory complications: Secondary | ICD-10-CM

## 2021-03-08 DIAGNOSIS — J302 Other seasonal allergic rhinitis: Secondary | ICD-10-CM | POA: Diagnosis not present

## 2021-03-08 DIAGNOSIS — H9193 Unspecified hearing loss, bilateral: Secondary | ICD-10-CM | POA: Diagnosis not present

## 2021-03-08 DIAGNOSIS — G3184 Mild cognitive impairment, so stated: Secondary | ICD-10-CM

## 2021-03-08 DIAGNOSIS — I10 Essential (primary) hypertension: Secondary | ICD-10-CM

## 2021-03-08 DIAGNOSIS — I25118 Atherosclerotic heart disease of native coronary artery with other forms of angina pectoris: Secondary | ICD-10-CM

## 2021-03-08 MED ORDER — FLUTICASONE PROPIONATE 50 MCG/ACT NA SUSP: 2.0000 | Freq: Every day | NASAL | 6 refills | Status: DC

## 2021-03-08 MED ORDER — PREDNISONE 20 MG PO TABS: 20.0000 mg | ORAL_TABLET | Freq: Every day | ORAL | 0 refills | Status: DC

## 2021-03-08 NOTE — Progress Notes (Signed)
I,Roshena L Chambers,acting as a scribe for [name], MD.,have documented all relevant documentation on the behalf of [name], MD,as directed by  [name], MD while in the presence of [name], MD.    Established patient visit   Patient: [name] [name]   DOB: October 01, 1955   66 y.o. Male  MRN: [id number] Visit Date: 03/08/2021  Today's healthcare provider: [name], MD   Chief Complaint  Patient presents with   Sinus Problem   Subjective    Sinus Problem This is a new problem. Episode onset: 6 weeks ago. The problem is unchanged. There has been no fever. Associated symptoms include congestion, coughing (productive with yellow sputum), ear pain, sinus pressure and a sore throat. Pertinent negatives include no chills or shortness of breath.   When he complains about his head congestion and decreased hearing along with some tinnitus. No fever or chills.  Coughing is minimal.  He had COVID in May 2022 and has had coughing since then.  It is improving.  He has a history of allergies that have been mild at this point. His wife is a little bit worried about his memory. He is taking medications as prescribed.  No difficulties per patient. MMSE - Mini Mental State Exam 03/08/2021  Orientation to time 4  Orientation to Place 5  Registration 3  Attention/ Calculation 5  Recall 3  Language- name 2 objects 2  Language- repeat 1  Language- follow 3 step command 3  Language- read & follow direction 0  Write a sentence 1  Copy design 1  Total score 28     Medications: Outpatient Medications Prior to Visit  Medication Sig   aspirin 81 MG tablet Take by mouth.   atorvastatin (LIPITOR) 20 MG tablet TAKE 1 TABLET BY MOUTH  DAILY AT 6 PM   cholecalciferol (VITAMIN D) 1000 UNITS tablet Take by mouth.   Coenzyme Q10 100 MG TABS Take by mouth.   ezetimibe (ZETIA) 10 MG tablet TAKE 1 TABLET BY MOUTH  DAILY   Ferrous Sulfate (IRON PO) Take by  mouth.   JARDIANCE 10 MG TABS tablet TAKE 1 TABLET(10 MG) BY MOUTH DAILY BREAKFAST   levothyroxine (SYNTHROID) 75 MCG tablet TAKE 1 TABLET BY MOUTH  DAILY BEFORE BREAKFAST   metFORMIN (GLUCOPHAGE) 1000 MG tablet Take 1 tablet (1,000 mg total) by mouth 2 (two) times daily with a meal.   metoprolol succinate (TOPROL-XL) 100 MG 24 hr tablet Take with or immediately following a meal.   nitroGLYCERIN (NITROSTAT) 0.4 MG SL tablet Place 1 tablet (0.4 mg total) under the tongue every 5 (five) minutes as needed for chest pain.   ONETOUCH ULTRA test strip CHECK BLOOD SUGAR ONCE  DAILY   pioglitazone (ACTOS) 30 MG tablet Take 1 tablet (30 mg total) by mouth daily.   pyridOXINE (VITAMIN B-6) 100 MG tablet Take 100 mg by mouth daily.   sertraline (ZOLOFT) 100 MG tablet TAKE 1 TABLET BY MOUTH  DAILY   temazepam (RESTORIL) 30 MG capsule Take 1 capsule (30 mg total) by mouth at bedtime.   [DISCONTINUED] fluticasone (FLONASE) 50 MCG/ACT nasal spray Place into the nose. Reported on 08/14/2015 (Patient not taking: Reported on 09/11/2020)   [DISCONTINUED] lansoprazole (PREVACID) 15 MG capsule Take by mouth. (Patient not taking: Reported on 03/08/2021)   [DISCONTINUED] montelukast (SINGULAIR) 10 MG tablet Take 1 tablet (10 mg total) by mouth at bedtime. (Patient not taking: Reported on 03/08/2021)   [  DISCONTINUED] ondansetron (ZOFRAN-ODT) 8 MG disintegrating tablet Take by mouth. Reported on 04/25/2015 (Patient not taking: Reported on 09/11/2020)   [DISCONTINUED] Saw [place]metto 160 MG TABS Take by mouth. (Patient not taking: Reported on 09/11/2020)   No facility-administered medications prior to visit.    Review of Systems  Constitutional:  Negative for appetite change, chills and fever.  HENT:  Positive for congestion, ear pain, sinus pressure, sore throat and tinnitus.   Eyes:  Positive for pain.  Respiratory:  Positive for cough (productive with yellow sputum). Negative for chest tightness, shortness of breath and  wheezing.   Cardiovascular:  Negative for chest pain and palpitations.  Gastrointestinal:  Negative for abdominal pain, nausea and vomiting.   Last hemoglobin A1c Lab Results  Component Value Date   HGBA1C 7.6 (A) 01/09/2021       Objective    BP (!) 124/98 (BP Location: Left Arm, Patient Position: Sitting, Cuff Size: Normal)    Pulse 87    Temp 98 F (36.7 C) (Oral)    Resp 14    Wt 188 lb (85.3 kg)    SpO2 99% Comment: room air   BMI 26.22 kg/m  BP Readings from Last 3 Encounters:  03/08/21 (!) 124/98  01/09/21 108/74  09/11/20 130/80   Wt Readings from Last 3 Encounters:  03/08/21 188 lb (85.3 kg)  01/09/21 190 lb (86.2 kg)  09/11/20 198 lb 4 oz (89.9 kg)      Today's Vitals   03/08/21 1034 03/08/21 1037  BP: (!) 124/92 (!) 124/98  Pulse: 87   Resp: 14   Temp: 98 F (36.7 C)   TempSrc: Oral   SpO2: 99%   Weight: 188 lb (85.3 kg)    Body mass index is 26.22 kg/m.    Physical Exam Vitals reviewed.  Constitutional:      Appearance: Normal appearance.  HENT:     Head: Normocephalic and atraumatic.     Right Ear: Tympanic membrane and external ear normal.     Left Ear: Tympanic membrane and external ear normal.     Mouth/Throat:     Mouth: Mucous membranes are moist.  Eyes:     General: No scleral icterus.    Conjunctiva/sclera: Conjunctivae normal.  Cardiovascular:     Rate and Rhythm: Normal rate and regular rhythm.     Pulses: Normal pulses.     Heart sounds: Normal heart sounds.  Pulmonary:     Effort: Pulmonary effort is normal.     Breath sounds: Normal breath sounds.  Abdominal:     Palpations: Abdomen is soft.  Skin:    General: Skin is warm and dry.  Neurological:     General: No focal deficit present.     Mental Status: He is alert and oriented to person, place, and time.  Psychiatric:        Mood and Affect: Mood normal.        Behavior: Behavior normal.        Thought Content: Thought content normal.        Judgment: Judgment normal.       No results found for any visits on 03/08/21.  Assessment & Plan     1. Decreased hearing of both ears  - Ambulatory referral to Audiology  2. Seasonal allergic rhinitis, unspecified trigger Flonase and use 5 days of prednisone. - fluticasone (FLONASE) 50 MCG/ACT nasal spray; Place 2 sprays into both nostrils daily.  Dispense: 16 g; Refill: 6 - predniSONE (DELTASONE) 20  MG tablet; Take 1 tablet (20 mg total) by mouth daily with breakfast.  Dispense: 5 tablet; Refill: 0  3. Mild cognitive impairment MMSE score 28 today.  Follow clinically. 4. Type 2 diabetes mellitus with other circulatory complication, without long-term current use of insulin (HCC) A1c has been under good control.  5. Adult hypothyroidism   6. Essential (primary) hypertension   7. Major depressive disorder, single episode, mild (HCC) In remission  8. Coronary artery disease of native artery of native heart with stable angina pectoris ([place]) All risk factors treated.  No follow-ups on file.      I, [name], MD, have reviewed all documentation for this visit. The documentation on 03/13/21 for the exam, diagnosis, procedures, and orders are all accurate and complete.    Liesel Peckenpaugh Cranford Mon, MD  [facility] [phone] (phone) [phone] (fax)  [place]

## 2021-03-09 ENCOUNTER — Other Ambulatory Visit: Payer: Self-pay | Admitting: Family Medicine

## 2021-03-19 NOTE — Progress Notes (Signed)
Cardiology Office Note  Date:  03/20/2021   ID:  [name] [name], [name] [name] 12-13-1955, MRN [id number]  PCP:  Jerrol Banana., MD   Chief Complaint  Patient presents with   6 month follow up     Patient c/o chest pain and shortness of breath with stress & over exertion. Medications reviewed by the patient verbally.     HPI:  [name] [name] is a 66 y.o. male with PMH of Coronary artery disease ,  NSTEMI in 2008 s/p PCI with BMS to right posterolateral branch., PCI to RPL Hyperlipidemia, unspecified hyperlipidemia type  Primary hypertension  Type 2 diabetes mellitus with complication, without long-term current use of insulin Tachycardia  Who presents for f/u of his CAD  LOV 8/22 Recent studies reviewed in detail Echo for chest pain 9/22  1. Left ventricular ejection fraction, by estimation, is 55 to 60%. The  left ventricle has normal function. The left ventricle demonstrates  regional wall motion abnormalities (see scoring diagram/findings for  description). There is moderate left  ventricular hypertrophy. Left ventricular diastolic parameters are  consistent with Grade I diastolic dysfunction (impaired relaxation). There  is mild hypokinesis of the left ventricular, basal-mid anterolateral wall.  The average left ventricular global  longitudinal strain is -14.9 %. The global longitudinal strain is  abnormal.   2. Right ventricular systolic function is low normal. The right  ventricular size is normal. There is normal pulmonary artery systolic  pressure.   Discussed recent events, URI sx Treated with prednisone by Dr. Rosanna Randy, Could not handle prednisone, irritable, poor sleep Has noticed high sugars, running 400 this morning  In general feels he is doing well Prednisone " fixed me".  Breathing better, when he breathes better less chest pain Doing chainsaw all day with just periodic breaks to stop and recover breathing otherwise did not feel it was an issue with his  heart  Non-smoker, active, taking care of cattle Lives in [facility] reviewed A1C 7.6 Total chol 126, LDL 41  EKG personally reviewed by myself on todays visit Nsr rate 91 bpm nonspecific ST abnormality, repolarization  Other past medical history reviewed  "virus" in May 2022 Covid 07/2020, had some residual shortness of breath  Angina sx in 2008, SOB At time of NSTEMI had chest pain  [place] 2019 brief SVT episodes (30 total, longest 14 beats) without associated patient triggered events   PMH:   has a past medical history of A-fib ([place]), Coronary artery disease, Diabetes mellitus without complication ([place]), Hyperlipidemia, Hypertension, Raynaud's disease, and Thyroid disease.  PSH:    Past Surgical History:  Procedure Laterality Date   CARDIAC CATHETERIZATION     CARDIAC SURGERY  05/30/2005   stent placement    Current Outpatient Medications  Medication Sig Dispense Refill   aspirin 81 MG tablet Take by mouth.     atorvastatin (LIPITOR) 20 MG tablet TAKE 1 TABLET BY MOUTH  DAILY AT 6 PM 90 tablet 3   cholecalciferol (VITAMIN D) 1000 UNITS tablet Take by mouth.     Coenzyme Q10 100 MG TABS Take by mouth.     ezetimibe (ZETIA) 10 MG tablet TAKE 1 TABLET BY MOUTH  DAILY 90 tablet 3   Ferrous Sulfate (IRON PO) Take 325 mg by mouth daily.     fluticasone (FLONASE) 50 MCG/ACT nasal spray Place 2 sprays into both nostrils daily. 16 g 6   JARDIANCE 10 MG TABS tablet TAKE 1 TABLET(10 MG) BY MOUTH DAILY BREAKFAST 90  tablet 3   levothyroxine (SYNTHROID) 75 MCG tablet TAKE 1 TABLET BY MOUTH  DAILY BEFORE BREAKFAST 90 tablet 3   metFORMIN (GLUCOPHAGE) 1000 MG tablet Take 1 tablet (1,000 mg total) by mouth 2 (two) times daily with a meal. 180 tablet 3   metoprolol succinate (TOPROL-XL) 100 MG 24 hr tablet Take with or immediately following a meal. 90 tablet 3   nitroGLYCERIN (NITROSTAT) 0.4 MG SL tablet Place 1 tablet (0.4 mg total) under the tongue every 5 (five) minutes as  needed for chest pain. 25 tablet 1   ONETOUCH ULTRA test strip CHECK BLOOD SUGAR ONCE  DAILY 100 strip 3   pioglitazone (ACTOS) 30 MG tablet Take 1 tablet (30 mg total) by mouth daily. 90 tablet 3   pyridOXINE (VITAMIN B-6) 100 MG tablet Take 100 mg by mouth daily.     sertraline (ZOLOFT) 100 MG tablet TAKE 1 TABLET BY MOUTH  DAILY 90 tablet 1   temazepam (RESTORIL) 30 MG capsule Take 1 capsule (30 mg total) by mouth at bedtime. 90 capsule 1   predniSONE (DELTASONE) 20 MG tablet Take 1 tablet (20 mg total) by mouth daily with breakfast. (Patient not taking: Reported on 03/20/2021) 5 tablet 0   No current facility-administered medications for this visit.    Allergies:   Enalapril   Social History:  The patient  reports that he has never smoked. He has never used smokeless tobacco. He reports current alcohol use. He reports that he does not use drugs.   Family History:   family history includes Diabetes in his mother; Glaucoma in his mother; Gout in his mother; Heart disease in his father and mother; Hypertension in his father, mother, sister, and sister; Prostate cancer in his father; Skin cancer in his mother; Ulcers in his mother.    Review of Systems: Review of Systems  Constitutional: Negative.   HENT: Negative.    Respiratory: Negative.    Cardiovascular: Negative.   Gastrointestinal: Negative.   Musculoskeletal: Negative.   Neurological: Negative.   Psychiatric/Behavioral: Negative.    All other systems reviewed and are negative.   PHYSICAL EXAM: VS:  BP 124/76 (BP Location: Left Arm, Patient Position: Sitting, Cuff Size: Normal)    Pulse 91    Ht 5' 10.5" (1.791 m)    Wt 185 lb 2 oz (84 kg)    SpO2 98%    BMI 26.19 kg/m  , BMI Body mass index is 26.19 kg/m. Constitutional:  oriented to person, place, and time. No distress.  HENT:  Head: Grossly normal Eyes:  no discharge. No scleral icterus.  Neck: No JVD, no carotid bruits  Cardiovascular: Regular rate and rhythm, no  murmurs appreciated Pulmonary/Chest: Clear to auscultation bilaterally, no wheezes or rails Abdominal: Soft.  no distension.  no tenderness.  Musculoskeletal: Normal range of motion Neurological:  normal muscle tone. Coordination normal. No atrophy Skin: Skin warm and dry Psychiatric: normal affect, pleasant  Recent Labs: 04/12/2020: ALT 29 01/09/2021: BUN 16; Creatinine, Ser 1.14; Hemoglobin 12.0; Platelets 271; Potassium 4.7; Sodium 138; TSH 4.100    Lipid Panel Lab Results  Component Value Date   CHOL 126 04/12/2020   HDL 57 04/12/2020   LDLCALC 41 04/12/2020   TRIG 171 (H) 04/12/2020        ASSESSMENT AND PLAN:  Problem List Items Addressed This Visit       Cardiology Problems   Coronary artery disease of native artery of native heart with stable angina pectoris ([place]) - Primary  Relevant Orders   EKG 12-Lead   Essential (primary) hypertension   Relevant Orders   EKG 12-Lead   HLD (hyperlipidemia)   Other Visit Diagnoses     Diabetes mellitus type 2 with complications ([place])       Relevant Orders   EKG 12-Lead   Shortness of breath         Coronary disease with stable angina Currently with no symptoms of angina. No further workup at this time. Continue current medication regimen. Call if worsening SOB, chest pain  Hyperlipidemia Cholesterol is at goal on the current lipid regimen. No changes to the medications were made.  Diabetes type 2 with complications Managed by  Dr. Rosanna Randy  Sugars high after prednisone, now off prednisone   Total encounter time more than 30 minutes  Greater than 50% was spent in counseling and coordination of care with the patient   Signed, Esmond Plants, M.D., Ph.D. [place], [place]

## 2021-03-20 ENCOUNTER — Telehealth: Payer: Self-pay

## 2021-03-20 ENCOUNTER — Other Ambulatory Visit: Payer: Self-pay

## 2021-03-20 ENCOUNTER — Encounter: Payer: Self-pay | Admitting: Cardiovascular Disease

## 2021-03-20 ENCOUNTER — Ambulatory Visit: Payer: Medicare Other | Admitting: Cardiovascular Disease

## 2021-03-20 VITALS — BP 124/76 | HR 91 | Ht 70.5 in | Wt 185.1 lb

## 2021-03-20 DIAGNOSIS — I25118 Atherosclerotic heart disease of native coronary artery with other forms of angina pectoris: Secondary | ICD-10-CM

## 2021-03-20 DIAGNOSIS — E118 Type 2 diabetes mellitus with unspecified complications: Secondary | ICD-10-CM

## 2021-03-20 DIAGNOSIS — I10 Essential (primary) hypertension: Secondary | ICD-10-CM | POA: Diagnosis not present

## 2021-03-20 DIAGNOSIS — E782 Mixed hyperlipidemia: Secondary | ICD-10-CM | POA: Diagnosis not present

## 2021-03-20 DIAGNOSIS — R0602 Shortness of breath: Secondary | ICD-10-CM

## 2021-03-20 NOTE — Telephone Encounter (Signed)
Please advise 

## 2021-03-20 NOTE — Telephone Encounter (Signed)
Patient seen Dr. Rockey Situ today and his blood sugar has went from 200's to 400's. He has been on Prednisone and took the last one on 03/15/21.   Dr. Rockey Situ wanted him to see if he needed to tweak the Jardiance for a week or 2 or leave it as it. Please let patient know.

## 2021-03-20 NOTE — Patient Instructions (Signed)
Medication Instructions:  No changes  If you need a refill on your cardiac medications before your next appointment, please call your pharmacy.   Lab work: No new labs needed  Testing/Procedures: No new testing needed  Follow-Up: At [facility], you and your health needs are our priority.  As part of our continuing mission to provide you with exceptional heart care, we have created designated Provider Care Teams.  These Care Teams include your primary Cardiologist (physician) and Advanced Practice Providers (APPs -  Physician Assistants and Nurse Practitioners) who all work together to provide you with the care you need, when you need it.  You will need a follow up appointment in 12 months  Providers on your designated Care Team:   Christopher Berge, NP Ryan Dunn, PA-C Cadence Furth, PA-C  COVID-19 Vaccine Information can be found at: [email or web address] For questions related to vaccine distribution or appointments, please email [email or web address] or call [phone].   

## 2021-03-20 NOTE — Telephone Encounter (Signed)
Pt. Given message, verbalizes understanding. 

## 2021-03-20 NOTE — Telephone Encounter (Signed)
LMOVM for pt to return call. Okay for pec triage to advise patient.

## 2021-05-31 ENCOUNTER — Ambulatory Visit (INDEPENDENT_AMBULATORY_CARE_PROVIDER_SITE_OTHER): Payer: Medicare Other | Admitting: Family Medicine

## 2021-05-31 VITALS — BP 108/64 | HR 93 | Temp 97.6°F | Wt 186.0 lb

## 2021-05-31 DIAGNOSIS — I2510 Atherosclerotic heart disease of native coronary artery without angina pectoris: Secondary | ICD-10-CM

## 2021-05-31 DIAGNOSIS — E782 Mixed hyperlipidemia: Secondary | ICD-10-CM | POA: Diagnosis not present

## 2021-05-31 DIAGNOSIS — Z0000 Encounter for general adult medical examination without abnormal findings: Secondary | ICD-10-CM

## 2021-05-31 DIAGNOSIS — E119 Type 2 diabetes mellitus without complications: Secondary | ICD-10-CM

## 2021-05-31 DIAGNOSIS — E1159 Type 2 diabetes mellitus with other circulatory complications: Secondary | ICD-10-CM | POA: Diagnosis not present

## 2021-05-31 DIAGNOSIS — Z125 Encounter for screening for malignant neoplasm of prostate: Secondary | ICD-10-CM

## 2021-05-31 DIAGNOSIS — E039 Hypothyroidism, unspecified: Secondary | ICD-10-CM | POA: Diagnosis not present

## 2021-05-31 DIAGNOSIS — I10 Essential (primary) hypertension: Secondary | ICD-10-CM

## 2021-05-31 DIAGNOSIS — D509 Iron deficiency anemia, unspecified: Secondary | ICD-10-CM

## 2021-05-31 DIAGNOSIS — I4891 Unspecified atrial fibrillation: Secondary | ICD-10-CM

## 2021-05-31 DIAGNOSIS — I7300 Raynaud's syndrome without gangrene: Secondary | ICD-10-CM

## 2021-05-31 LAB — POCT URINALYSIS DIPSTICK
Bilirubin, UA: NEGATIVE
Blood, UA: NEGATIVE
Glucose, UA: POSITIVE — AB
Ketones, UA: NEGATIVE
Leukocytes, UA: NEGATIVE
Nitrite, UA: NEGATIVE
Protein, UA: NEGATIVE
Spec Grav, UA: 1.02 (ref 1.010–1.025)
Urobilinogen, UA: 0.2 E.U./dL
pH, UA: 5 (ref 5.0–8.0)

## 2021-05-31 NOTE — Progress Notes (Signed)
? ? ? ?Complete physical exam ? ? ?Patient: [name] [name]   DOB: Jul 25, 1955   66 y.o. Male  MRN: [id number] ?Visit Date: 05/31/2021 ? ?Today's healthcare provider: [name], MD  ? ?No chief complaint on file. ? ?Subjective  ?  ?[name] [name] is a 66 y.o. male who presents today for a complete physical exam.  ?Overall patient feels well.  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 ?He is taking medication as prescribed ?He still is taking temazepam as needed for sleep. ? ? ? ?Past Medical History:  ?Diagnosis Date  ? A-fib ([place])   ? Coronary artery disease   ? Diabetes mellitus without complication ([place])   ? Hyperlipidemia   ? Hypertension   ? Raynaud's disease   ? Thyroid disease   ? ?Past Surgical History:  ?Procedure Laterality Date  ? CARDIAC CATHETERIZATION    ? CARDIAC SURGERY  05/30/2005  ? stent placement  ? ?Social History  ? ?Socioeconomic History  ? Marital status: Married  ?  Spouse name: Not on file  ? Number of children: Not on file  ? Years of education: Not on file  ? Highest education level: Not on file  ?Occupational History  ? Not on file  ?Tobacco Use  ? Smoking status: Never  ? Smokeless tobacco: Never  ?Vaping Use  ? Vaping Use: Never used  ?Substance and Sexual Activity  ? Alcohol use: Yes  ?  Comment: 2-3 beer on the weekends  ? Drug use: No  ? Sexual activity: Yes  ?Other Topics Concern  ? Not on file  ?Social History Narrative  ? Not on file  ? ?Social Determinants of Health  ? ?Financial Resource Strain: Not on file  ?Food Insecurity: Not on file  ?Transportation Needs: Not on file  ?Physical Activity: Not on file  ?Stress: Not on file  ?Social Connections: Not on file  ?Intimate Partner Violence: Not on file  ? ?Family Status  ?Relation Name Status  ? Mother  Deceased at age 68  ? Father  Deceased  ? Sister  Alive  ? Brother  Alive  ? Daughter  Alive  ? Son  Alive  ? Brother  Alive  ? Sister  Alive  ? ?Family History  ?Problem Relation Age of Onset  ? Diabetes  Mother   ? Heart disease Mother   ? Hypertension Mother   ? Skin cancer Mother   ? Glaucoma Mother   ? Ulcers Mother   ? Gout Mother   ? Heart disease Father   ? Hypertension Father   ? Prostate cancer Father   ? Hypertension Sister   ? Hypertension Sister   ? ?Allergies  ?Allergen Reactions  ? Enalapril Swelling  ?  Lip swelling  ?  ?Patient Care Team: ?Jerrol Banana., MD as PCP - General (Family Medicine)  ? ?Medications: ?Outpatient Medications Prior to Visit  ?Medication Sig  ? aspirin 81 MG tablet Take by mouth.  ? atorvastatin (LIPITOR) 20 MG tablet TAKE 1 TABLET BY MOUTH  DAILY AT 6 PM  ? cholecalciferol (VITAMIN D) 1000 UNITS tablet Take by mouth.  ? Coenzyme Q10 100 MG TABS Take by mouth.  ? ezetimibe (ZETIA) 10 MG tablet TAKE 1 TABLET BY MOUTH  DAILY  ? Ferrous Sulfate (IRON PO) Take 325 mg by mouth daily.  ? fluticasone (FLONASE) 50 MCG/ACT nasal spray Place 2 sprays into both nostrils daily.  ? JARDIANCE 10 MG TABS  tablet TAKE 1 TABLET(10 MG) BY MOUTH DAILY BREAKFAST  ? levothyroxine (SYNTHROID) 75 MCG tablet TAKE 1 TABLET BY MOUTH  DAILY BEFORE BREAKFAST  ? metFORMIN (GLUCOPHAGE) 1000 MG tablet Take 1 tablet (1,000 mg total) by mouth 2 (two) times daily with a meal.  ? metoprolol succinate (TOPROL-XL) 100 MG 24 hr tablet Take with or immediately following a meal.  ? nitroGLYCERIN (NITROSTAT) 0.4 MG SL tablet Place 1 tablet (0.4 mg total) under the tongue every 5 (five) minutes as needed for chest pain.  ? ONETOUCH ULTRA test strip CHECK BLOOD SUGAR ONCE  DAILY  ? pioglitazone (ACTOS) 30 MG tablet Take 1 tablet (30 mg total) by mouth daily.  ? pyridOXINE (VITAMIN B-6) 100 MG tablet Take 100 mg by mouth daily.  ? sertraline (ZOLOFT) 100 MG tablet TAKE 1 TABLET BY MOUTH  DAILY  ? temazepam (RESTORIL) 30 MG capsule Take 1 capsule (30 mg total) by mouth at bedtime.  ? [DISCONTINUED] predniSONE (DELTASONE) 20 MG tablet Take 1 tablet (20 mg total) by mouth daily with breakfast. (Patient not taking:  Reported on 03/20/2021)  ? ?No facility-administered medications prior to visit.  ? ? ?Review of Systems  ?Constitutional: Negative.   ?HENT:  Positive for congestion, hearing loss, rhinorrhea, sneezing and tinnitus.   ?Eyes: Negative.   ?Respiratory:  Positive for cough.   ?Cardiovascular: Negative.   ?Gastrointestinal: Negative.   ?Endocrine: Negative.   ?Genitourinary: Negative.   ?Musculoskeletal: Negative.   ?Skin: Negative.   ?Allergic/Immunologic: Negative.   ?Neurological: Negative.   ?Hematological: Negative.   ?Psychiatric/Behavioral: Negative.    ? ?Last hemoglobin A1c ?Lab Results  ?Component Value Date  ? HGBA1C 8.8 (H) 05/31/2021  ? ?  ? Objective  ? ?  ?BP 108/64 (BP Location: Right Arm, Cuff Size: Normal)   Pulse 93   Temp 97.6 ?F (36.4 ?C) (Oral)   Wt 186 lb (84.4 kg)   SpO2 98%   BMI 26.31 kg/m?  ?BP Readings from Last 3 Encounters:  ?05/31/21 108/64  ?03/20/21 124/76  ?03/08/21 (!) 124/98  ? ?Wt Readings from Last 3 Encounters:  ?05/31/21 186 lb (84.4 kg)  ?03/20/21 185 lb 2 oz (84 kg)  ?03/08/21 188 lb (85.3 kg)  ? ?  ? ? ?Physical Exam ?Constitutional:   ?   Appearance: Normal appearance. He is normal weight.  ?HENT:  ?   Head: Normocephalic and atraumatic.  ?   Right Ear: Tympanic membrane, ear canal and external ear normal.  ?   Left Ear: Tympanic membrane, ear canal and external ear normal.  ?   Nose: Nose normal.  ?   Mouth/Throat:  ?   Mouth: Mucous membranes are moist.  ?   Pharynx: Oropharynx is clear.  ?Eyes:  ?   Extraocular Movements: Extraocular movements intact.  ?   Conjunctiva/sclera: Conjunctivae normal.  ?   Pupils: Pupils are equal, round, and reactive to light.  ?Cardiovascular:  ?   Rate and Rhythm: Normal rate and regular rhythm.  ?   Pulses: Normal pulses.  ?   Heart sounds: Normal heart sounds.  ?Pulmonary:  ?   Effort: Pulmonary effort is normal.  ?   Breath sounds: Normal breath sounds.  ?Abdominal:  ?   General: Abdomen is flat. Bowel sounds are normal.  ?    Palpations: Abdomen is soft.  ?Genitourinary: ?   Penis: Normal.   ?   Testes: Normal.  ?Musculoskeletal:     ?   General: Normal range of  motion.  ?   Cervical back: Normal range of motion and neck supple.  ?   Right lower leg: No edema.  ?   Left lower leg: No edema.  ?Skin: ?   General: Skin is warm and dry.  ?Neurological:  ?   General: No focal deficit present.  ?   Mental Status: He is alert and oriented to person, place, and time. Mental status is at baseline.  ?Psychiatric:     ?   Mood and Affect: Mood normal.     ?   Behavior: Behavior normal.     ?   Thought Content: Thought content normal.     ?   Judgment: Judgment normal.  ?  ? ? ?Last depression screening scores ? ?  05/31/2021  ? 10:29 AM 03/08/2021  ? 10:40 AM 01/09/2021  ?  8:38 AM  ?PHQ 2/9 Scores  ?PHQ - 2 Score 0 1 0  ?PHQ- 9 Score '1 3 1  '$ ? ?Last fall risk screening ? ?  05/31/2021  ? 10:29 AM  ?Fall Risk   ?Falls in the past year? 0  ? ?Last Audit-C alcohol use screening ? ?  05/31/2021  ? 10:29 AM  ?Alcohol Use Disorder Test (AUDIT)  ?1. How often do you have a drink containing alcohol? 2  ?2. How many drinks containing alcohol do you have on a typical day when you are drinking? 0  ?3. How often do you have six or more drinks on one occasion? 2  ?AUDIT-C Score 4  ? ?A score of 3 or more in women, and 4 or more in men indicates increased risk for alcohol abuse, EXCEPT if all of the points are from question 1  ? ?No results found for any visits on 05/31/21. ? Assessment & Plan  ?  ?Routine Health Maintenance and Physical Exam ? ?Exercise Activities and Dietary recommendations ? Goals   ?None ?  ? ? ?Immunization History  ?Administered Date(s) Administered  ? Fluad Quad(high Dose 65+) 12/15/2018, 01/09/2021  ? Influenza Split 12/04/2010, 12/03/2011  ? Influenza,inj,Quad PF,6+ Mos 01/05/2013, 10/27/2013, 12/25/2015, 12/16/2016, 10/16/2017  ? PFIZER(Purple Top)SARS-COV-2 Vaccination 04/21/2019, 05/12/2019, 03/08/2020  ? PNEUMOCOCCAL CONJUGATE-20 01/09/2021   ? Pneumococcal Conjugate-13 01/05/2013  ? Tdap 02/09/2008, 09/06/2020  ? ? ?Health Maintenance  ?Topic Date Due  ? Zoster Vaccines- Shingrix (1 of 2) Never done  ? COVID-19 Vaccine (4 - Booster for Pfizer series) 04/06

## 2021-06-01 LAB — LIPID PANEL WITH LDL/HDL RATIO
Cholesterol, Total: 142 mg/dL (ref 100–199)
HDL: 47 mg/dL (ref >39)
LDL Chol Calc (NIH): 57 mg/dL (ref 0–99)
LDL/HDL Ratio: 1.2 ratio (ref 0.0–3.6)
Triglycerides: 240 mg/dL — ABNORMAL HIGH (ref 0–149)
VLDL Cholesterol Cal: 38 mg/dL (ref 5–40)

## 2021-06-01 LAB — CBC WITH DIFFERENTIAL/PLATELET
Basophils Absolute: 0.1 10*3/uL (ref 0.0–0.2)
Basos: 1 %
EOS (ABSOLUTE): 0.4 10*3/uL (ref 0.0–0.4)
Eos: 4 %
Hematocrit: 43 % (ref 37.5–51.0)
Hemoglobin: 13.4 g/dL (ref 13.0–17.7)
Immature Grans (Abs): 0 10*3/uL (ref 0.0–0.1)
Immature Granulocytes: 0 %
Lymphocytes Absolute: 1.9 10*3/uL (ref 0.7–3.1)
Lymphs: 23 %
MCH: 24.7 pg — ABNORMAL LOW (ref 26.6–33.0)
MCHC: 31.2 g/dL — ABNORMAL LOW (ref 31.5–35.7)
MCV: 79 fL (ref 79–97)
Monocytes Absolute: 0.7 10*3/uL (ref 0.1–0.9)
Monocytes: 8 %
Neutrophils Absolute: 5.3 10*3/uL (ref 1.4–7.0)
Neutrophils: 64 %
Platelets: 267 10*3/uL (ref 150–450)
RBC: 5.43 x10E6/uL (ref 4.14–5.80)
RDW: 17.9 % — ABNORMAL HIGH (ref 11.6–15.4)
WBC: 8.4 10*3/uL (ref 3.4–10.8)

## 2021-06-01 LAB — TSH: TSH: 4.34 u[IU]/mL (ref 0.450–4.500)

## 2021-06-01 LAB — COMPREHENSIVE METABOLIC PANEL
ALT: 22 IU/L (ref 0–44)
AST: 23 IU/L (ref 0–40)
Albumin/Globulin Ratio: 1.6 (ref 1.2–2.2)
Albumin: 4.5 g/dL (ref 3.8–4.8)
Alkaline Phosphatase: 94 IU/L (ref 44–121)
BUN/Creatinine Ratio: 17 (ref 10–24)
BUN: 19 mg/dL (ref 8–27)
Bilirubin Total: 0.5 mg/dL (ref 0.0–1.2)
CO2: 23 mmol/L (ref 20–29)
Calcium: 10.3 mg/dL — ABNORMAL HIGH (ref 8.6–10.2)
Chloride: 101 mmol/L (ref 96–106)
Creatinine, Ser: 1.11 mg/dL (ref 0.76–1.27)
Globulin, Total: 2.8 g/dL (ref 1.5–4.5)
Glucose: 169 mg/dL — ABNORMAL HIGH (ref 70–99)
Potassium: 4.8 mmol/L (ref 3.5–5.2)
Sodium: 139 mmol/L (ref 134–144)
Total Protein: 7.3 g/dL (ref 6.0–8.5)
eGFR: 74 mL/min/{1.73_m2} (ref >59)

## 2021-06-01 LAB — HEMOGLOBIN A1C
Est. average glucose Bld gHb Est-mCnc: 206 mg/dL
Hgb A1c MFr Bld: 8.8 % — ABNORMAL HIGH (ref 4.8–5.6)

## 2021-06-01 LAB — PSA: Prostate Specific Ag, Serum: 1.4 ng/mL (ref 0.0–4.0)

## 2021-06-15 ENCOUNTER — Other Ambulatory Visit: Payer: Self-pay

## 2021-06-15 DIAGNOSIS — E119 Type 2 diabetes mellitus without complications: Secondary | ICD-10-CM

## 2021-06-15 MED ORDER — PIOGLITAZONE HCL 45 MG PO TABS: 45.0000 mg | ORAL_TABLET | Freq: Every day | ORAL | 1 refills | Status: DC

## 2021-07-05 ENCOUNTER — Telehealth: Payer: Self-pay | Admitting: Pharmacist

## 2021-07-05 DIAGNOSIS — Z760 Encounter for issue of repeat prescription: Secondary | ICD-10-CM

## 2021-07-05 NOTE — Progress Notes (Signed)
[facility])                                            [place] Team    07/05/2021  [name] [name] 11/16/1955 [id number]  Patient was called to follow up on his refills being sent from [facility]. Spoke with the patient and  his wife. HIPAA identifiers were obtained.  Patient said he had not received any of his medications from [facility] despite speaking with another [facility] Pharmacist on 06/01/21 and having his account set up.  [facility] was called.  The representative said the patient's account is set up but they did not have any prescriptions on file for the patient.    If deemed therapeutically appropriate, the patient's PCP could send new prescriptions (escribe) of his current medications to [facility] located at [place] [address].    Plan:  Route note to the patient's PCP requesting new prescriptions to be sent to the patient's pharmacy. Follow up within 1 week.  Elayne Guerin, PharmD, [place] Clinical Pharmacist [phone]

## 2021-07-24 ENCOUNTER — Other Ambulatory Visit: Payer: Self-pay | Admitting: *Deleted

## 2021-07-24 DIAGNOSIS — E119 Type 2 diabetes mellitus without complications: Secondary | ICD-10-CM

## 2021-07-24 DIAGNOSIS — E039 Hypothyroidism, unspecified: Secondary | ICD-10-CM

## 2021-07-24 DIAGNOSIS — E785 Hyperlipidemia, unspecified: Secondary | ICD-10-CM

## 2021-07-24 DIAGNOSIS — I10 Essential (primary) hypertension: Secondary | ICD-10-CM

## 2021-07-24 DIAGNOSIS — F325 Major depressive disorder, single episode, in full remission: Secondary | ICD-10-CM

## 2021-07-24 DIAGNOSIS — J302 Other seasonal allergic rhinitis: Secondary | ICD-10-CM

## 2021-07-24 DIAGNOSIS — G4709 Other insomnia: Secondary | ICD-10-CM

## 2021-07-24 MED ORDER — ATORVASTATIN CALCIUM 20 MG PO TABS: ORAL_TABLET | ORAL | 3 refills | Status: DC

## 2021-07-24 MED ORDER — ATORVASTATIN CALCIUM 20 MG PO TABS: ORAL_TABLET | ORAL | 0 refills | Status: DC

## 2021-07-24 MED ORDER — METFORMIN HCL 1000 MG PO TABS: 1000.0000 mg | ORAL_TABLET | Freq: Two times a day (BID) | ORAL | 3 refills | Status: DC

## 2021-07-24 MED ORDER — NITROGLYCERIN 0.4 MG SL SUBL: 0.4000 mg | SUBLINGUAL_TABLET | SUBLINGUAL | 1 refills | Status: DC | PRN

## 2021-07-24 MED ORDER — PIOGLITAZONE HCL 45 MG PO TABS: 45.0000 mg | ORAL_TABLET | Freq: Every day | ORAL | 3 refills | Status: DC

## 2021-07-24 MED ORDER — SERTRALINE HCL 100 MG PO TABS: 100.0000 mg | ORAL_TABLET | Freq: Every day | ORAL | 0 refills | Status: DC

## 2021-07-24 MED ORDER — SERTRALINE HCL 100 MG PO TABS: 100.0000 mg | ORAL_TABLET | Freq: Every day | ORAL | 1 refills | Status: DC

## 2021-07-24 MED ORDER — LEVOTHYROXINE SODIUM 75 MCG PO TABS: 75.0000 ug | ORAL_TABLET | Freq: Every day | ORAL | 0 refills | Status: DC

## 2021-07-24 MED ORDER — METOPROLOL SUCCINATE ER 100 MG PO TB24: ORAL_TABLET | ORAL | 3 refills | Status: DC

## 2021-07-24 MED ORDER — FLUTICASONE PROPIONATE 50 MCG/ACT NA SUSP: 2.0000 | Freq: Every day | NASAL | 6 refills | Status: AC

## 2021-07-24 MED ORDER — METFORMIN HCL 1000 MG PO TABS: 1000.0000 mg | ORAL_TABLET | Freq: Two times a day (BID) | ORAL | 0 refills | Status: DC

## 2021-07-24 MED ORDER — EZETIMIBE 10 MG PO TABS: 10.0000 mg | ORAL_TABLET | Freq: Every day | ORAL | 3 refills | Status: DC

## 2021-07-24 MED ORDER — EMPAGLIFLOZIN 10 MG PO TABS: ORAL_TABLET | ORAL | 0 refills | Status: DC

## 2021-07-24 MED ORDER — LEVOTHYROXINE SODIUM 75 MCG PO TABS: 75.0000 ug | ORAL_TABLET | Freq: Every day | ORAL | 3 refills | Status: DC

## 2021-07-24 MED ORDER — TEMAZEPAM 30 MG PO CAPS: 30.0000 mg | ORAL_CAPSULE | Freq: Every day | ORAL | 0 refills | Status: DC

## 2021-07-24 MED ORDER — METOPROLOL SUCCINATE ER 100 MG PO TB24: ORAL_TABLET | ORAL | 0 refills | Status: DC

## 2021-07-24 MED ORDER — TEMAZEPAM 30 MG PO CAPS: 30.0000 mg | ORAL_CAPSULE | Freq: Every day | ORAL | 1 refills | Status: DC

## 2021-07-24 MED ORDER — EMPAGLIFLOZIN 10 MG PO TABS: ORAL_TABLET | ORAL | 3 refills | Status: DC

## 2021-07-24 MED ORDER — PIOGLITAZONE HCL 45 MG PO TABS: 45.0000 mg | ORAL_TABLET | Freq: Every day | ORAL | 0 refills | Status: DC

## 2021-07-26 ENCOUNTER — Telehealth: Payer: Self-pay | Admitting: Family Medicine

## 2021-07-26 NOTE — Telephone Encounter (Signed)
[facility] is calling to get clarity on instructions on the metoprolol succinate (TOPROL-XL) 100 MG 24 hr tablet [[id number]]   Reference Number- [id number]  [phone] Fax- [phone]

## 2021-07-27 NOTE — Telephone Encounter (Signed)
Please review and advise on how patient need to take the Metoprolol. How often? And is it with every meal?

## 2021-07-30 ENCOUNTER — Ambulatory Visit: Payer: Self-pay | Admitting: *Deleted

## 2021-07-30 NOTE — Telephone Encounter (Signed)
Call from [facility] when transferred from agent caller hung up or disconnected.  Called [facility] back and spoke with Kresaterica L. To review which medication was needing clarification. Will allow [facility] to call back if needed.

## 2021-08-03 ENCOUNTER — Telehealth: Payer: Self-pay | Admitting: Family Medicine

## 2021-08-03 NOTE — Telephone Encounter (Signed)
Patient called in states, received letter from [facility] needing clarification on these medications/jaudiance/ ezetimibe/ sertriline/ metformin /metoprolol succinate (TOPROL-XL) 100 MG 24 hr tablet. She isn't sure what they are needing. She says they are asking for a call back to  [phone].

## 2021-08-06 ENCOUNTER — Other Ambulatory Visit: Payer: Self-pay | Admitting: *Deleted

## 2021-08-06 DIAGNOSIS — G4709 Other insomnia: Secondary | ICD-10-CM

## 2021-08-06 DIAGNOSIS — E039 Hypothyroidism, unspecified: Secondary | ICD-10-CM

## 2021-08-06 DIAGNOSIS — I10 Essential (primary) hypertension: Secondary | ICD-10-CM

## 2021-08-06 DIAGNOSIS — E119 Type 2 diabetes mellitus without complications: Secondary | ICD-10-CM

## 2021-08-06 DIAGNOSIS — F325 Major depressive disorder, single episode, in full remission: Secondary | ICD-10-CM

## 2021-08-06 DIAGNOSIS — E785 Hyperlipidemia, unspecified: Secondary | ICD-10-CM

## 2021-08-06 MED ORDER — EZETIMIBE 10 MG PO TABS: 10.0000 mg | ORAL_TABLET | Freq: Every day | ORAL | 3 refills | Status: DC

## 2021-08-06 MED ORDER — ATORVASTATIN CALCIUM 20 MG PO TABS: ORAL_TABLET | ORAL | 3 refills | Status: DC

## 2021-08-06 MED ORDER — LEVOTHYROXINE SODIUM 75 MCG PO TABS: 75.0000 ug | ORAL_TABLET | Freq: Every day | ORAL | 3 refills | Status: DC

## 2021-08-06 MED ORDER — EMPAGLIFLOZIN 10 MG PO TABS: ORAL_TABLET | ORAL | 3 refills | Status: DC

## 2021-08-06 MED ORDER — SERTRALINE HCL 100 MG PO TABS: 100.0000 mg | ORAL_TABLET | Freq: Every day | ORAL | 1 refills | Status: DC

## 2021-08-06 MED ORDER — PIOGLITAZONE HCL 45 MG PO TABS: 45.0000 mg | ORAL_TABLET | Freq: Every day | ORAL | 3 refills | Status: DC

## 2021-08-06 MED ORDER — METOPROLOL SUCCINATE ER 100 MG PO TB24: 100.0000 mg | ORAL_TABLET | Freq: Every day | ORAL | 3 refills | Status: DC

## 2021-08-06 MED ORDER — METFORMIN HCL 1000 MG PO TABS: 1000.0000 mg | ORAL_TABLET | Freq: Two times a day (BID) | ORAL | 3 refills | Status: DC

## 2021-08-06 NOTE — Telephone Encounter (Signed)
Received fax concerning below. Resent medication to [facility].

## 2021-08-07 ENCOUNTER — Other Ambulatory Visit: Payer: Self-pay | Admitting: Family Medicine

## 2021-08-07 DIAGNOSIS — F325 Major depressive disorder, single episode, in full remission: Secondary | ICD-10-CM

## 2021-08-07 MED ORDER — TEMAZEPAM 30 MG PO CAPS: 30.0000 mg | ORAL_CAPSULE | Freq: Every day | ORAL | 1 refills | Status: DC

## 2021-08-08 ENCOUNTER — Ambulatory Visit: Payer: Self-pay | Admitting: *Deleted

## 2021-08-08 NOTE — Telephone Encounter (Signed)
[name] [name] from [facility] called in to get clear directions on med metoprolol succinate (TOPROL-XL) 100 MG. She didn't mention exactly what directions needed. Please call back   Spoke with tech. States issue was already taken care of and no additional clarification needed. Reason for Disposition  Caller has medicine question only, adult not sick, AND triager answers question  Answer Assessment - Initial Assessment Questions 1. NAME of MEDICATION: "What medicine are you calling about?"     Toprol  Protocols used: Medication Question Call-A-AH

## 2021-08-28 NOTE — Telephone Encounter (Signed)
Patient was advised.  

## 2021-08-30 ENCOUNTER — Other Ambulatory Visit: Payer: Self-pay | Admitting: Family Medicine

## 2021-08-30 DIAGNOSIS — E119 Type 2 diabetes mellitus without complications: Secondary | ICD-10-CM

## 2021-08-30 DIAGNOSIS — I10 Essential (primary) hypertension: Secondary | ICD-10-CM

## 2021-10-10 ENCOUNTER — Encounter: Payer: Self-pay | Admitting: Family Medicine

## 2021-10-10 ENCOUNTER — Ambulatory Visit (INDEPENDENT_AMBULATORY_CARE_PROVIDER_SITE_OTHER): Payer: Medicare Other | Admitting: Family Medicine

## 2021-10-10 VITALS — BP 119/85 | HR 86 | Resp 16 | Wt 188.0 lb

## 2021-10-10 DIAGNOSIS — I10 Essential (primary) hypertension: Secondary | ICD-10-CM

## 2021-10-10 DIAGNOSIS — E1159 Type 2 diabetes mellitus with other circulatory complications: Secondary | ICD-10-CM

## 2021-10-10 DIAGNOSIS — Z23 Encounter for immunization: Secondary | ICD-10-CM | POA: Diagnosis not present

## 2021-10-10 DIAGNOSIS — I25118 Atherosclerotic heart disease of native coronary artery with other forms of angina pectoris: Secondary | ICD-10-CM

## 2021-10-10 DIAGNOSIS — E039 Hypothyroidism, unspecified: Secondary | ICD-10-CM

## 2021-10-10 DIAGNOSIS — F320 Major depressive disorder, single episode, mild: Secondary | ICD-10-CM

## 2021-10-10 NOTE — Progress Notes (Signed)
Established patient visit  I,[name],acting as a scribe for [name], MD.,have documented all relevant documentation on the behalf of [name], MD,as directed by  [name], MD while in the presence of [name], MD.   Patient: [name] [name]   DOB: [birth year]   65 y.o. Male  MRN: [id number] Visit Date: 10/10/2021  Today's healthcare provider: [name], MD   Chief Complaint  Patient presents with   Follow-up   Diabetes   Hypertension   Subjective    HPI  Patient feels well overall.  Continues to work full-time as far but is starting to slow down some.  Wife is now retired.  Tolerating his medications and has no complaints today.  Diabetes Mellitus Type II, follow-up  Lab Results  Component Value Date   HGBA1C 8.8 (H) 05/31/2021   HGBA1C 7.6 (A) 01/09/2021   HGBA1C 7.9 (H) 09/06/2020   Last seen for diabetes 4 months ago.  Management since then includes; increased pioglitazone from 30 to 45 mg daily for diabetes.   Home blood sugar records: fasting range: 120-200 Most Recent Eye Exam: sent fax requesting most recent eye exam.  --------------------------------------------------------------------------------------------------- Hypertension, follow-up  BP Readings from Last 3 Encounters:  10/10/21 119/85  05/31/21 108/64  03/20/21 124/76   Wt Readings from Last 3 Encounters:  10/10/21 188 lb (85.3 kg)  05/31/21 186 lb (84.4 kg)  03/20/21 185 lb 2 oz (84 kg)     He was last seen for hypertension 4 months ago.  Management since that visit includes; Good control.   Outside blood pressures are 120/70.  --------------------------------------------------------------------------------------------------   Medications: Outpatient Medications Prior to Visit  Medication Sig   aspirin 81 MG tablet Take by mouth.   atorvastatin (LIPITOR) 20 MG tablet TAKE 1 TABLET BY MOUTH  DAILY AT 6 PM   cholecalciferol  (VITAMIN D) 1000 UNITS tablet Take by mouth.   Coenzyme Q10 100 MG TABS Take by mouth.   empagliflozin (JARDIANCE) 10 MG TABS tablet TAKE 1 TABLET(10 MG) BY MOUTH DAILY BREAKFAST   ezetimibe (ZETIA) 10 MG tablet Take 1 tablet (10 mg total) by mouth daily.   Ferrous Sulfate (IRON PO) Take 325 mg by mouth daily.   fluticasone (FLONASE) 50 MCG/ACT nasal spray Place 2 sprays into both nostrils daily.   levothyroxine (SYNTHROID) 75 MCG tablet Take 1 tablet (75 mcg total) by mouth daily before breakfast.   metFORMIN (GLUCOPHAGE) 1000 MG tablet TAKE 1 TABLET(1000 MG) BY MOUTH TWICE DAILY WITH A MEAL   metoprolol succinate (TOPROL-XL) 100 MG 24 hr tablet TAKE 1 TABLET BY MOUTH AS DIRECTED WITH OR IMMEDIATELY FOLLOWING A MEAL   nitroGLYCERIN (NITROSTAT) 0.4 MG SL tablet Place 1 tablet (0.4 mg total) under the tongue every 5 (five) minutes as needed for chest pain.   ONETOUCH ULTRA test strip CHECK BLOOD SUGAR ONCE  DAILY   pioglitazone (ACTOS) 45 MG tablet Take 1 tablet (45 mg total) by mouth daily.   pyridOXINE (VITAMIN B-6) 100 MG tablet Take 100 mg by mouth daily.   sertraline (ZOLOFT) 100 MG tablet Take 1 tablet (100 mg total) by mouth daily.   temazepam (RESTORIL) 30 MG capsule Take 1 capsule (30 mg total) by mouth at bedtime.   No facility-administered medications prior to visit.    Review of Systems  Constitutional:  Negative for appetite change, chills and fever.  Respiratory:  Negative for chest tightness, shortness of breath and wheezing.  Cardiovascular:  Negative for chest pain and palpitations.  Gastrointestinal:  Negative for abdominal pain, nausea and vomiting.    Last hemoglobin A1c Lab Results  Component Value Date   HGBA1C 8.8 (H) 10/10/2021       Objective    BP 119/85 (BP Location: Right Arm, Patient Position: Sitting, Cuff Size: Large)   Pulse 86   Resp 16   Wt 188 lb (85.3 kg)   SpO2 96%   BMI 26.59 kg/m  BP Readings from Last 3 Encounters:  10/10/21 119/85   05/31/21 108/64  03/20/21 124/76   Wt Readings from Last 3 Encounters:  10/10/21 188 lb (85.3 kg)  05/31/21 186 lb (84.4 kg)  03/20/21 185 lb 2 oz (84 kg)      Physical Exam Vitals reviewed.  Constitutional:      Appearance: Normal appearance.  HENT:     Head: Normocephalic and atraumatic.     Right Ear: Tympanic membrane and external ear normal.     Left Ear: Tympanic membrane and external ear normal.     Mouth/Throat:     Mouth: Mucous membranes are moist.  Eyes:     General: No scleral icterus.    Conjunctiva/sclera: Conjunctivae normal.  Cardiovascular:     Rate and Rhythm: Normal rate and regular rhythm.     Pulses: Normal pulses.     Heart sounds: Normal heart sounds.  Pulmonary:     Effort: Pulmonary effort is normal.     Breath sounds: Normal breath sounds.  Abdominal:     Palpations: Abdomen is soft.  Skin:    General: Skin is warm and dry.  Neurological:     General: No focal deficit present.     Mental Status: He is alert and oriented to person, place, and time.  Psychiatric:        Mood and Affect: Mood normal.        Behavior: Behavior normal.        Thought Content: Thought content normal.        Judgment: Judgment normal.       No results found for any visits on 10/10/21.  Assessment & Plan     1. Type 2 diabetes mellitus with other circulatory complication, without long-term current use of insulin (HCC) C today is 8.8.  Jardiance from 10 to 25 mg daily - Hemoglobin A1c  2. Essential (primary) hypertension Blood pressure controlled. - Calcium, ionized  3. Serum calcium elevated Follow-up mildly elevated calcium - Calcium - Parathyroid hormone, intact (no Ca)  4. Need for immunization against influenza  - Flu Vaccine QUAD High Dose(Fluad)  5. Coronary artery disease of native artery of native heart with stable angina pectoris ([place]) All risk factors treated  6. Adult hypothyroidism   7. Major depressive disorder, single episode,  mild (HCC) Continue sertraline   No follow-ups on file.      I, [name], MD, have reviewed all documentation for this visit. The documentation on 10/13/21 for the exam, diagnosis, procedures, and orders are all accurate and complete.    Ineta Sinning Cranford Mon, MD  [facility] [phone] (phone) [phone] (fax)  [place]

## 2021-10-11 LAB — HEMOGLOBIN A1C
Est. average glucose Bld gHb Est-mCnc: 206 mg/dL
Hgb A1c MFr Bld: 8.8 % — ABNORMAL HIGH (ref 4.8–5.6)

## 2021-10-11 LAB — PARATHYROID HORMONE, INTACT (NO CA): PTH: 25 pg/mL (ref 15–65)

## 2021-10-11 LAB — CALCIUM, IONIZED: Calcium, Ion: 5.6 mg/dL (ref 4.5–5.6)

## 2021-10-11 LAB — CALCIUM: Calcium: 10.4 mg/dL — ABNORMAL HIGH (ref 8.6–10.2)

## 2021-10-24 ENCOUNTER — Other Ambulatory Visit: Payer: Self-pay | Admitting: *Deleted

## 2021-10-24 DIAGNOSIS — E1159 Type 2 diabetes mellitus with other circulatory complications: Secondary | ICD-10-CM

## 2021-10-24 MED ORDER — EMPAGLIFLOZIN 25 MG PO TABS: 25.0000 mg | ORAL_TABLET | Freq: Every day | ORAL | 3 refills | Status: DC

## 2021-11-09 IMAGING — CR DG CHEST 2V
1 series · 2 of 2 positions shown · non-contrast
Comparison: None.

CLINICAL DATA: Pneumonia due to [id number] virus.

EXAM:
CHEST - 2 VIEW

[Series 1: dg chest 2 view · 0.14mm/px · 2 of 2 slices shown]
[im 1/2]
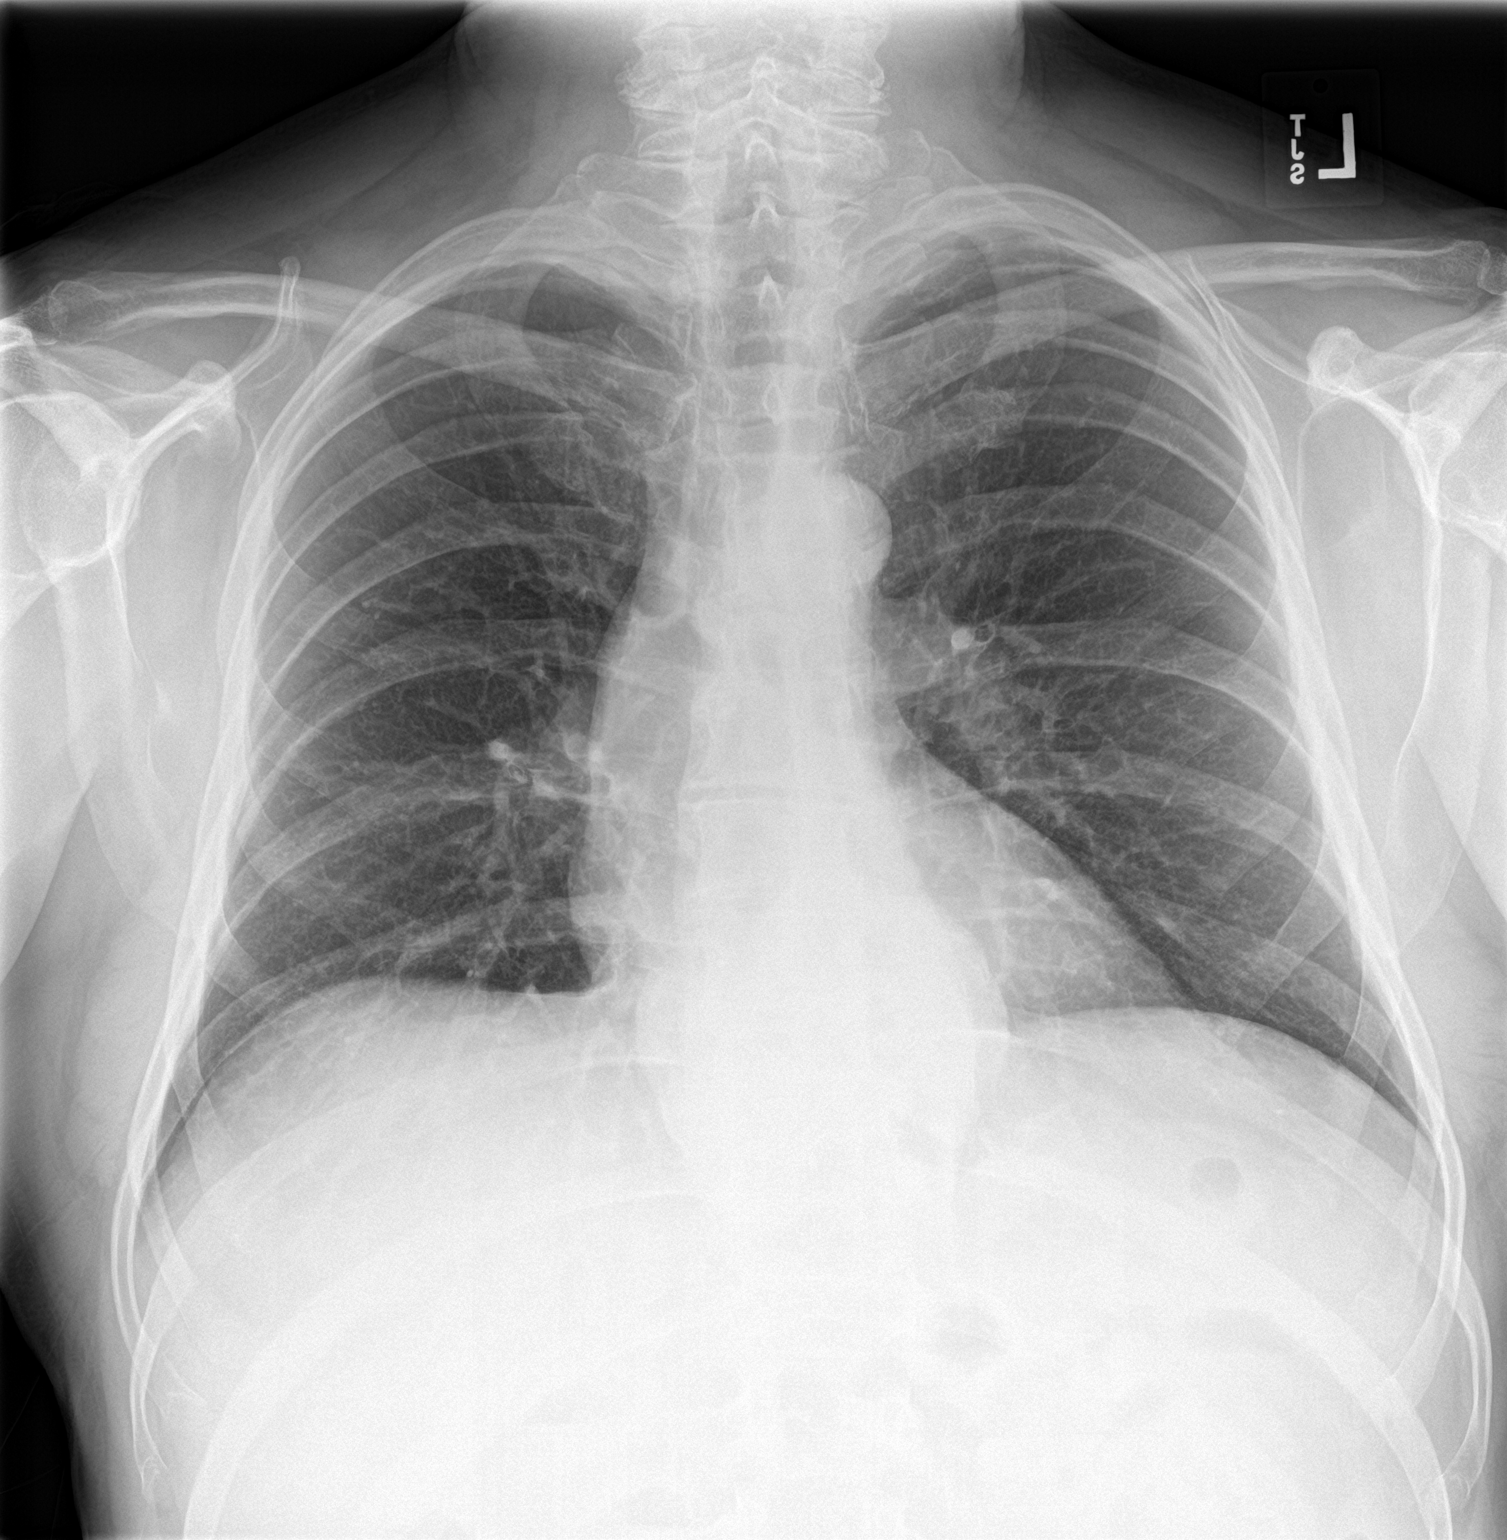
[im 2/2]
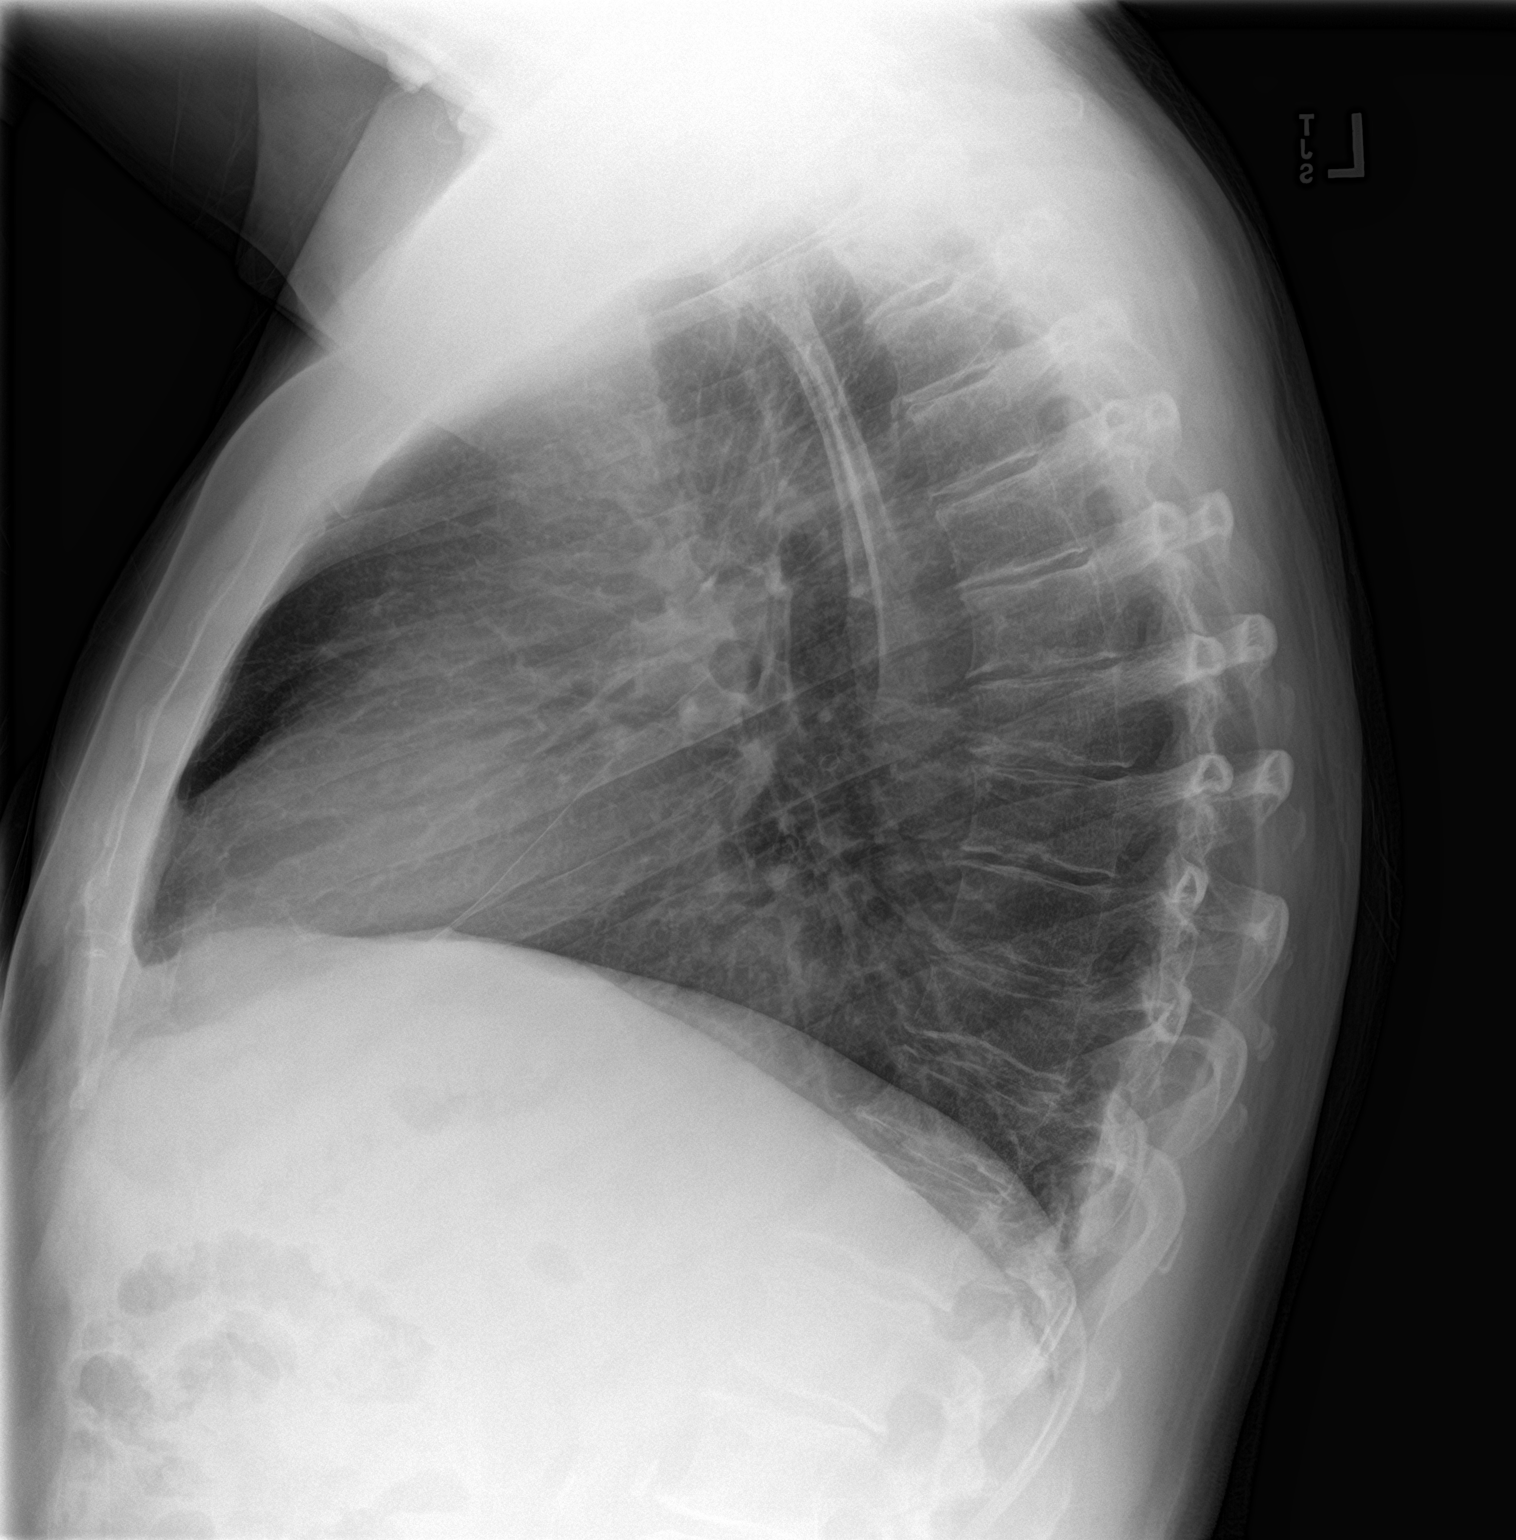

[2 of 2 positions shown; findings below may reference images not displayed]

FINDINGS: Lungs are clear. Heart and mediastinum are within normal limits.
Retrocardiac opacity is suggestive for a hiatal hernia. No large
pleural effusions. Degenerative changes in lower cervical spine.
There may be mild compression deformities in lower thoracic spine
but suspect this is chronic and could be related to degenerative
endplate changes.
IMPRESSION: 1. No acute cardiopulmonary disease.
2. Hiatal hernia.

## 2021-11-14 DIAGNOSIS — E119 Type 2 diabetes mellitus without complications: Secondary | ICD-10-CM | POA: Diagnosis not present

## 2021-11-14 LAB — HM DIABETES EYE EXAM

## 2022-01-23 ENCOUNTER — Telehealth: Payer: Self-pay | Admitting: Family Medicine

## 2022-01-23 DIAGNOSIS — E119 Type 2 diabetes mellitus without complications: Secondary | ICD-10-CM

## 2022-01-23 MED ORDER — PIOGLITAZONE HCL 45 MG PO TABS: 45.0000 mg | ORAL_TABLET | Freq: Every day | ORAL | 0 refills | Status: DC

## 2022-01-23 NOTE — Telephone Encounter (Signed)
[facility] faxed refill request for the following medications:   pioglitazone (ACTOS) 45 MG tablet    Please advise

## 2022-02-10 NOTE — Progress Notes (Unsigned)
I,[name] [name],acting as a scribe for [employer], MD.,have documented all relevant documentation on the behalf of [name] Foster, MD,as directed by  [name] Foster, MD while in the presence of [name] Foster, MD.   Established patient visit   Patient: [name] [name]   DOB: 24-Jun-1955   67 y.o. Male  MRN: [id number] Visit Date: 02/11/2022  Today's healthcare provider: [name], MD   Chief Complaint  Patient presents with   follow-up chronic disease   Subjective    HPI   Diabetes Mellitus Type II, Follow-up  Lab Results  Component Value Date   HGBA1C 8.3 (A) 02/11/2022   HGBA1C 8.8 (H) 10/10/2021   HGBA1C 8.8 (H) 05/31/2021   Wt Readings from Last 3 Encounters:  02/11/22 188 lb 3.2 oz (85.4 kg)  10/10/21 188 lb (85.3 kg)  05/31/21 186 lb (84.4 kg)   Last seen for diabetes 4 months ago.  Management since then includes Jardiance from 10 to 25 mg daily . He reports excellent compliance with treatment. 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 No  Current insulin regiment: none Eye Exam was in Oct 2023   Pertinent Labs: Lab Results  Component Value Date   CHOL 142 05/31/2021   HDL 47 05/31/2021   LDLCALC 57 05/31/2021   TRIG 240 (H) 05/31/2021   CHOLHDL 2.2 04/12/2020   Lab Results  Component Value Date   NA 139 05/31/2021   K 4.8 05/31/2021   CREATININE 1.11 05/31/2021   EGFR 74 05/31/2021   LABMICR 4.9 01/09/2021   MICRALBCREAT 7 01/09/2021      --------------------------------------------------------------------------------------------------- Hypertension, follow-up  BP Readings from Last 3 Encounters:  02/11/22 113/85  10/10/21 119/85  05/31/21 108/64   Wt Readings from Last 3 Encounters:  02/11/22 188 lb 3.2 oz (85.4 kg)  10/10/21 188 lb (85.3 kg)  05/31/21 186 lb (84.4 kg)     He was last seen for hypertension 4 months ago.  BP at that visit was 119/85. Management since that visit includes none.  He reports excellent compliance with treatment.  He does not smoke.   Outside blood pressures are 120's-130's/70-90  Symptoms: No chest pain No chest pressure  No palpitations No syncope  No dyspnea No orthopnea  No paroxysmal nocturnal dyspnea No lower extremity edema   Pertinent labs Lab Results  Component Value Date   CHOL 142 05/31/2021   HDL 47 05/31/2021   LDLCALC 57 05/31/2021   TRIG 240 (H) 05/31/2021   CHOLHDL 2.2 04/12/2020   Lab Results  Component Value Date   NA 139 05/31/2021   K 4.8 05/31/2021   CREATININE 1.11 05/31/2021   EGFR 74 05/31/2021   GLUCOSE 169 (H) 05/31/2021   TSH 4.340 05/31/2021     The ASCVD Risk score (Arnett DK, et al., 2019) failed to calculate for the following reasons:   The patient has a prior MI or stroke diagnosis  --------------------------------------------------------------------------------------------------- CAD  Stable Angina  Hx Atrial Fibrillation - tolerating metoprolol    Hypothyroidism - no problems with taking thyroid medication   Insomnia- reports taking temazepam for sleep,  reports he has been on this for years, this medication helps him drift to sleep and stay asleep, does not often take on nightly basis, he reports being restless if he tries to skip doses for too long, he states that there were nights when he would only get 2-3 hours of sleep per night before taking the medication   Medications: Outpatient Medications Prior to Visit  Medication  Sig   aspirin 81 MG tablet Take by mouth.   cholecalciferol (VITAMIN D) 1000 UNITS tablet Take by mouth.   Coenzyme Q10 100 MG TABS Take by mouth.   empagliflozin (JARDIANCE) 25 MG TABS tablet Take 1 tablet (25 mg total) by mouth daily.   Ferrous Sulfate (IRON PO) Take 325 mg by mouth daily.   fluticasone (FLONASE) 50 MCG/ACT nasal spray Place 2 sprays into both nostrils daily.   levothyroxine (SYNTHROID) 75 MCG tablet Take 1 tablet (75 mcg total) by mouth daily before breakfast.   metFORMIN (GLUCOPHAGE) 1000 MG tablet TAKE 1 TABLET(1000 MG) BY MOUTH TWICE DAILY WITH A MEAL   metoprolol succinate (TOPROL-XL) 100 MG 24 hr tablet TAKE 1 TABLET BY MOUTH AS DIRECTED WITH OR IMMEDIATELY FOLLOWING A MEAL   nitroGLYCERIN (NITROSTAT) 0.4 MG SL tablet Place 1 tablet (0.4 mg total) under the tongue every 5 (five) minutes as needed for chest pain.   pioglitazone (ACTOS) 45 MG tablet Take 1 tablet (45 mg total) by mouth daily.   pyridOXINE (VITAMIN B-6) 100 MG tablet Take 100 mg by mouth daily.   sertraline (ZOLOFT) 100 MG tablet Take 1 tablet (100 mg total) by mouth daily.   [DISCONTINUED] atorvastatin (LIPITOR) 20 MG tablet TAKE 1 TABLET BY MOUTH  DAILY AT 6 PM   [DISCONTINUED] ezetimibe (ZETIA) 10 MG tablet Take 1 tablet (10 mg total) by mouth daily.   [DISCONTINUED] ONETOUCH ULTRA test strip CHECK BLOOD SUGAR ONCE  DAILY   [DISCONTINUED] temazepam (RESTORIL) 30 MG capsule Take 1 capsule (30 mg total) by mouth at bedtime.   No facility-administered medications prior to visit.    Review of Systems     Objective    BP 113/85 (BP Location: Right Arm, Patient Position: Sitting, Cuff Size: Normal)   Pulse 87   Temp 98 F (36.7 C) (Oral)   Resp 16   Ht '5\' 11"'$  (1.803 m)   Wt 188 lb 3.2 oz (85.4 kg)   BMI 26.25 kg/m    Physical Exam Vitals reviewed.  Constitutional:      General: He is not in acute distress.    Appearance: Normal appearance. He is not ill-appearing, toxic-appearing or  diaphoretic.  Eyes:     Conjunctiva/sclera: Conjunctivae normal.  Cardiovascular:     Rate and Rhythm: Normal rate and regular rhythm.     Pulses: Normal pulses.     Heart sounds: Normal heart sounds. No murmur heard.    No friction rub. No gallop.  Pulmonary:     Effort: Pulmonary effort is normal. No respiratory distress.     Breath sounds: Normal breath sounds. No stridor. No wheezing, rhonchi or rales.  Abdominal:     General: Bowel sounds are normal. There is no distension.     Palpations: Abdomen is soft.     Tenderness: There is no abdominal tenderness.  Musculoskeletal:     Right lower leg: No edema.     Left lower leg: No edema.  Skin:    Findings: No erythema or rash.  Neurological:     Mental Status: He  is alert and oriented to person, place, and time.       Results for orders placed or performed in visit on 02/11/22  POCT glycosylated hemoglobin (Hb A1C)  Result Value Ref Range   Hemoglobin A1C 8.3 (A) 4.0 - 5.6 %   Est. average glucose Bld gHb Est-mCnc 192     Assessment & Plan     Problem List Items Addressed This Visit       Cardiovascular and Mediastinum   Coronary artery disease of native artery of native heart with stable angina pectoris ([place]) - Primary    Stable  On statin, metoprolol, ASA, Zetia, and nitro PRN  Followed by cardiology      Relevant Medications   atorvastatin (LIPITOR) 20 MG tablet   ezetimibe (ZETIA) 10 MG tablet   Essential (primary) hypertension    BP at goal  Continue metoprolol '100mg'$  daily       Relevant Medications   atorvastatin (LIPITOR) 20 MG tablet   ezetimibe (ZETIA) 10 MG tablet     Endocrine   Diabetes mellitus with hyperglycemia (HCC)    A1c improved from 8.8 to 8.3  Patient would like to continue regimen and work on diet and physical activity to get A1c to goal  He will continue actos '45mg'$ , jardiance '25mg'$  and metformin '1000mg'$  twice daily  Follow up in 3 months       Relevant Medications   atorvastatin  (LIPITOR) 20 MG tablet   glucose blood (ONETOUCH ULTRA) test strip   Adult hypothyroidism    Stable on synthroid 16mg No changes today         Other   HLD (hyperlipidemia)    Continue zetia and atorvastatin  Refills provided today       Relevant Medications   atorvastatin (LIPITOR) 20 MG tablet   ezetimibe (ZETIA) 10 MG tablet   Insomnia    Refill provided for temazepam  Symptoms well controlled on this medication  Patient will need controlled substance agreement at follow up       Relevant Medications   temazepam (RESTORIL) 30 MG capsule   History of atrial fibrillation    Rate controlled on metoprolol '100mg'$  daily         Return in about 3 months (around 05/13/2022) for DM f/u .      I, MEulis Foster MD, have reviewed all documentation for this visit.  Portions of this information were initially documented by the CMA and reviewed by me for thoroughness and accuracy.      MEulis Foster MD  [phone]) [phone](fax)  [place]

## 2022-02-11 ENCOUNTER — Ambulatory Visit (INDEPENDENT_AMBULATORY_CARE_PROVIDER_SITE_OTHER): Payer: Medicare Other | Admitting: Family Medicine

## 2022-02-11 ENCOUNTER — Encounter: Payer: Self-pay | Admitting: Family Medicine

## 2022-02-11 VITALS — BP 113/85 | HR 87 | Temp 98.0°F | Resp 16 | Ht 71.0 in | Wt 188.2 lb

## 2022-02-11 DIAGNOSIS — E119 Type 2 diabetes mellitus without complications: Secondary | ICD-10-CM

## 2022-02-11 DIAGNOSIS — Z8679 Personal history of other diseases of the circulatory system: Secondary | ICD-10-CM

## 2022-02-11 DIAGNOSIS — I25118 Atherosclerotic heart disease of native coronary artery with other forms of angina pectoris: Secondary | ICD-10-CM | POA: Diagnosis not present

## 2022-02-11 DIAGNOSIS — E039 Hypothyroidism, unspecified: Secondary | ICD-10-CM | POA: Diagnosis not present

## 2022-02-11 DIAGNOSIS — G4709 Other insomnia: Secondary | ICD-10-CM

## 2022-02-11 DIAGNOSIS — E782 Mixed hyperlipidemia: Secondary | ICD-10-CM

## 2022-02-11 DIAGNOSIS — E785 Hyperlipidemia, unspecified: Secondary | ICD-10-CM

## 2022-02-11 DIAGNOSIS — I10 Essential (primary) hypertension: Secondary | ICD-10-CM

## 2022-02-11 DIAGNOSIS — E1165 Type 2 diabetes mellitus with hyperglycemia: Secondary | ICD-10-CM

## 2022-02-11 LAB — POCT GLYCOSYLATED HEMOGLOBIN (HGB A1C)
Est. average glucose Bld gHb Est-mCnc: 192
Hemoglobin A1C: 8.3 % — AB (ref 4.0–5.6)

## 2022-02-11 MED ORDER — TEMAZEPAM 30 MG PO CAPS: 30.0000 mg | ORAL_CAPSULE | Freq: Every day | ORAL | 0 refills | Status: DC

## 2022-02-11 MED ORDER — EZETIMIBE 10 MG PO TABS: 10.0000 mg | ORAL_TABLET | Freq: Every day | ORAL | 2 refills | Status: DC

## 2022-02-11 MED ORDER — ATORVASTATIN CALCIUM 20 MG PO TABS: ORAL_TABLET | ORAL | 2 refills | Status: DC

## 2022-02-11 MED ORDER — ONETOUCH ULTRA VI STRP: ORAL_STRIP | 3 refills | Status: AC

## 2022-02-11 NOTE — Assessment & Plan Note (Signed)
Rate controlled on metoprolol '100mg'$  daily

## 2022-02-11 NOTE — Assessment & Plan Note (Addendum)
Stable on synthroid 41mg No changes today

## 2022-02-11 NOTE — Patient Instructions (Signed)

## 2022-02-11 NOTE — Assessment & Plan Note (Signed)
Stable  On statin, metoprolol, ASA, Zetia, and nitro PRN  Followed by cardiology

## 2022-02-11 NOTE — Assessment & Plan Note (Signed)
Continue zetia and atorvastatin  Refills provided today

## 2022-02-11 NOTE — Assessment & Plan Note (Signed)
BP at goal  Continue metoprolol '100mg'$  daily

## 2022-02-11 NOTE — Assessment & Plan Note (Signed)
Refill provided for temazepam  Symptoms well controlled on this medication  Patient will need controlled substance agreement at follow up

## 2022-02-11 NOTE — Assessment & Plan Note (Signed)
A1c improved from 8.8 to 8.3  Patient would like to continue regimen and work on diet and physical activity to get A1c to goal  He will continue actos '45mg'$ , jardiance '25mg'$  and metformin '1000mg'$  twice daily  Follow up in 3 months

## 2022-02-12 LAB — MICROALBUMIN / CREATININE URINE RATIO
Creatinine, Urine: 75.3 mg/dL
Microalb/Creat Ratio: 13 mg/g creat (ref 0–29)
Microalbumin, Urine: 10 ug/mL

## 2022-02-13 ENCOUNTER — Telehealth: Payer: Self-pay

## 2022-02-13 NOTE — Telephone Encounter (Signed)
Pt and wife given lab results per notes of Dr. Quentin Cornwall on 02/13/22. Pt and wife verbalized understanding. Wife asked about Temazepam being sent to pharmacy, advised her that rx was sent on 02/11/22 to pharmacy. No further assistance needed.

## 2022-03-13 ENCOUNTER — Other Ambulatory Visit: Payer: Self-pay | Admitting: Family Medicine

## 2022-03-13 ENCOUNTER — Telehealth: Payer: Self-pay | Admitting: Family Medicine

## 2022-03-13 DIAGNOSIS — G4709 Other insomnia: Secondary | ICD-10-CM

## 2022-03-13 MED ORDER — TEMAZEPAM 30 MG PO CAPS: 30.0000 mg | ORAL_CAPSULE | Freq: Every day | ORAL | 0 refills | Status: DC

## 2022-03-13 NOTE — Telephone Encounter (Signed)
[facility] faxed refill request for the following medications:  temazepam (RESTORIL) 30 MG capsule    Please advise.

## 2022-03-25 ENCOUNTER — Telehealth: Payer: Self-pay | Admitting: Family Medicine

## 2022-03-25 ENCOUNTER — Other Ambulatory Visit: Payer: Self-pay | Admitting: Family Medicine

## 2022-03-25 DIAGNOSIS — G4709 Other insomnia: Secondary | ICD-10-CM

## 2022-03-25 NOTE — Telephone Encounter (Signed)
[facility] faxed refill request for the following medications:    temazepam (RESTORIL) 30 MG capsule    Please advise

## 2022-03-25 NOTE — Telephone Encounter (Signed)
Next Refill due 04/11/22  Recently refilled on 03/13/22   Request submitted to early for refill today   [name] Foster, MD  [facility]

## 2022-03-28 ENCOUNTER — Telehealth: Payer: Self-pay | Admitting: Family Medicine

## 2022-03-28 ENCOUNTER — Other Ambulatory Visit: Payer: Self-pay

## 2022-03-28 DIAGNOSIS — G4709 Other insomnia: Secondary | ICD-10-CM

## 2022-03-28 NOTE — Telephone Encounter (Signed)
LOV 02/11/22 NOV 05/14/22 LRF 03/13/22 30 x 0 this was sent to local pharmacy and mail order pharmacy is requesting refill

## 2022-03-28 NOTE — Telephone Encounter (Signed)
[facility] faxed refill request for the following medications:    temazepam (RESTORIL) 30 MG capsule   Please advise

## 2022-04-09 ENCOUNTER — Other Ambulatory Visit: Payer: Self-pay | Admitting: Family Medicine

## 2022-04-09 DIAGNOSIS — G4709 Other insomnia: Secondary | ICD-10-CM

## 2022-04-09 DIAGNOSIS — I10 Essential (primary) hypertension: Secondary | ICD-10-CM

## 2022-04-09 MED ORDER — METOPROLOL SUCCINATE ER 100 MG PO TB24: ORAL_TABLET | ORAL | Status: DC

## 2022-04-09 MED ORDER — TEMAZEPAM 30 MG PO CAPS: 30.0000 mg | ORAL_CAPSULE | Freq: Every day | ORAL | 0 refills | Status: DC

## 2022-04-09 NOTE — Telephone Encounter (Signed)
Patient requesting refills    temazepam (RESTORIL) 30 MG capsule   metoprolol succinate (TOPROL-XL) 100 MG 24 hr tablet   Please advise

## 2022-04-17 ENCOUNTER — Ambulatory Visit: Payer: Medicare Other

## 2022-04-19 ENCOUNTER — Other Ambulatory Visit: Payer: Self-pay | Admitting: Family Medicine

## 2022-04-19 DIAGNOSIS — E119 Type 2 diabetes mellitus without complications: Secondary | ICD-10-CM

## 2022-04-22 NOTE — Telephone Encounter (Signed)
Requested Prescriptions  Pending Prescriptions Disp Refills   pioglitazone (ACTOS) 45 MG tablet [Pharmacy Med Name: PIOGLITAZONE 45MG  TABLETS] 90 tablet 1    Sig: TAKE 1 TABLET(45 MG) BY MOUTH DAILY     Endocrinology:  Diabetes - Glitazones - pioglitazone Failed - 04/19/2022  4:26 PM      Failed - HBA1C is between 0 and 7.9 and within 180 days    Hemoglobin A1C  Date Value Ref Range Status  02/11/2022 8.3 (A) 4.0 - 5.6 % Final   Hgb A1c MFr Bld  Date Value Ref Range Status  10/10/2021 8.8 (H) 4.8 - 5.6 % Final    Comment:             Prediabetes: 5.7 - 6.4          Diabetes: >6.4          Glycemic control for adults with diabetes: <7.0          Passed - Valid encounter within last 6 months    Recent Outpatient Visits           2 months ago Coronary artery disease of native artery of native heart with stable angina pectoris ([place])   [place] Simmons-Robinson, [place], MD   6 months ago Type 2 diabetes mellitus with other circulatory complication, without long-term current use of insulin ([place])   [place] Eulas Post, MD   10 months ago Annual physical exam   [facility] Eulas Post, MD   1 year ago Decreased hearing of both [employer] Eulas Post, MD   1 year ago Type 2 diabetes mellitus with other circulatory complication, without long-term current use of insulin [facility])   [place], Jake Church, DO       Future Appointments             In 3 weeks Simmons-Robinson, Riki Sheer, MD [facility]

## 2022-04-23 ENCOUNTER — Other Ambulatory Visit: Payer: Self-pay | Admitting: Family Medicine

## 2022-04-23 DIAGNOSIS — F325 Major depressive disorder, single episode, in full remission: Secondary | ICD-10-CM

## 2022-04-23 MED ORDER — SERTRALINE HCL 100 MG PO TABS: 100.0000 mg | ORAL_TABLET | Freq: Every day | ORAL | 2 refills | Status: DC

## 2022-05-14 ENCOUNTER — Ambulatory Visit (INDEPENDENT_AMBULATORY_CARE_PROVIDER_SITE_OTHER): Payer: Medicare Other | Admitting: Family Medicine

## 2022-05-14 ENCOUNTER — Encounter: Payer: Self-pay | Admitting: Family Medicine

## 2022-05-14 VITALS — BP 118/84 | HR 80 | Temp 97.8°F | Resp 16 | Wt 187.4 lb

## 2022-05-14 DIAGNOSIS — E559 Vitamin D deficiency, unspecified: Secondary | ICD-10-CM

## 2022-05-14 DIAGNOSIS — I10 Essential (primary) hypertension: Secondary | ICD-10-CM | POA: Diagnosis not present

## 2022-05-14 DIAGNOSIS — E1165 Type 2 diabetes mellitus with hyperglycemia: Secondary | ICD-10-CM | POA: Diagnosis not present

## 2022-05-14 DIAGNOSIS — E039 Hypothyroidism, unspecified: Secondary | ICD-10-CM | POA: Diagnosis not present

## 2022-05-14 DIAGNOSIS — G4709 Other insomnia: Secondary | ICD-10-CM

## 2022-05-14 DIAGNOSIS — E119 Type 2 diabetes mellitus without complications: Secondary | ICD-10-CM

## 2022-05-14 DIAGNOSIS — E782 Mixed hyperlipidemia: Secondary | ICD-10-CM | POA: Diagnosis not present

## 2022-05-14 MED ORDER — PIOGLITAZONE HCL 45 MG PO TABS
ORAL_TABLET | ORAL | 1 refills | Status: DC
Start: 2022-05-14 — End: 2022-11-12

## 2022-05-14 MED ORDER — TEMAZEPAM 30 MG PO CAPS
30.0000 mg | ORAL_CAPSULE | Freq: Every day | ORAL | 0 refills | Status: DC
Start: 2022-05-14 — End: 2022-06-14

## 2022-05-14 MED ORDER — LEVOTHYROXINE SODIUM 75 MCG PO TABS: 75.0000 ug | ORAL_TABLET | Freq: Every day | ORAL | 3 refills | Status: DC

## 2022-05-14 MED ORDER — METFORMIN HCL 1000 MG PO TABS: ORAL_TABLET | ORAL | 3 refills | Status: DC

## 2022-05-14 NOTE — Progress Notes (Signed)
I,[name],acting as a scribe for [employer], MD.,have documented all relevant documentation on the behalf of Ronnald Ramp, MD,as directed by  Ronnald Ramp, MD while in the presence of Ronnald Ramp, MD.   Established patient visit   Patient: [name] [name]   DOB: 1955/12/17   67 y.o. Male  MRN: [id number] Visit Date: 05/14/2022  Today's healthcare provider: Ronnald Ramp, MD   Chief Complaint  Patient presents with   Follow-up   Subjective    HPI   Diabetes Mellitus Type II, Follow-up  Lab Results  Component Value Date   HGBA1C 8.3 (A) 02/11/2022   HGBA1C 8.8 (H) 10/10/2021   HGBA1C 8.8 (H) 05/31/2021   Wt Readings from Last 3 Encounters:  05/14/22 187 lb 6.4 oz (85 kg)  02/11/22 188 lb 3.2 oz (85.4 kg)  10/10/21 188 lb (85.3 kg)   Last seen for diabetes 3 months ago.  Management since then includes continue current medications including metformin  BID, jardiance  daily, Actos  daily. He reports excellent compliance with treatment. He is not having side effects.  Symptoms: No fatigue No foot ulcerations  No appetite changes No nausea  No paresthesia of the feet  No polydipsia  No polyuria No visual disturbances   No vomiting     Home blood sugar records: fasting range: 120-200  Episodes of hypoglycemia? No    Current insulin regiment: none Most Recent Eye Exam: 10/2021  Pertinent Labs: Lab Results  Component Value Date   CHOL 142 05/31/2021   HDL 47 05/31/2021   LDLCALC 57 05/31/2021   TRIG 240 (H) 05/31/2021   CHOLHDL 2.2 04/12/2020   Lab Results  Component Value Date   NA 139 05/31/2021   K 4.8 05/31/2021   CREATININE 1.11 05/31/2021   EGFR 74 05/31/2021   LABMICR 10.0 02/11/2022   MICRALBCREAT 13 02/11/2022     ---------------------------------------------------------------------------------------------------  Lipid/Cholesterol, Follow-up  Last lipid  panel Other pertinent labs  Lab Results  Component Value Date   CHOL 142 05/31/2021   HDL 47 05/31/2021   LDLCALC 57 05/31/2021   TRIG 240 (H) 05/31/2021   CHOLHDL 2.2 04/12/2020   Lab Results  Component Value Date   ALT 22 05/31/2021   AST 23 05/31/2021   PLT 267 05/31/2021   TSH 4.340 05/31/2021     He was last seen for this 3 months ago.  Management since that visit includes continue Zetia  and atorvastatin  daily along with Coenzyme Q10 .  He reports excellent compliance with treatment. He is not having side effects.    The ASCVD Risk score (Arnett DK, et al., 2019) failed to calculate for the following reasons:   The patient has a prior MI or stroke diagnosis  ---------------------------------------------------------------------------------------------------  Hypertension, follow-up  BP Readings from Last 3 Encounters:  05/14/22 118/84  02/11/22 113/85  10/10/21 119/85   Wt Readings from Last 3 Encounters:  05/14/22 187 lb 6.4 oz (85 kg)  02/11/22 188 lb 3.2 oz (85.4 kg)  10/10/21 188 lb (85.3 kg)     He was last seen for hypertension 3 months ago.  BP at that visit was 113/85. Management since that visit includes continue metoprolol .  He reports excellent compliance with treatment.       Outside blood pressures are 130-140/80's.   Pertinent labs Lab Results  Component Value Date   CHOL 142 05/31/2021   HDL 47 05/31/2021   LDLCALC 57 05/31/2021   TRIG 240 (  H) 05/31/2021   CHOLHDL 2.2 04/12/2020   Lab Results  Component Value Date   NA 139 05/31/2021   K 4.8 05/31/2021   CREATININE 1.11 05/31/2021   EGFR 74 05/31/2021   GLUCOSE 169 (H) 05/31/2021   TSH 4.340 05/31/2021     The ASCVD Risk score (Arnett DK, et al., 2019) failed to calculate for the following reasons:   The patient has a prior MI or stroke  diagnosis  ---------------------------------------------------------------------------------------------------    Vitamin D deficiency, follow-up  Lab Results  Component Value Date   CALCIUM 10.4 (H) 10/10/2021   CALCIUM 10.3 (H) 05/31/2021   PTH 25 10/10/2021   PTH 17 05/15/2016   PTH Comment 05/15/2016        Wt Readings from Last 3 Encounters:  05/14/22 187 lb 6.4 oz (85 kg)  02/11/22 188 lb 3.2 oz (85.4 kg)  10/10/21 188 lb (85.3 kg)   Management since that visit includes continue vitamin D 1000 units. He reports good compliance with treatment. He is not having side effects.    ---------------------------------------------------------------------------------------------------  Hypothyroid, follow-up  Lab Results  Component Value Date   TSH 4.340 05/31/2021   TSH 4.100 01/09/2021   TSH 3.520 04/12/2020    Wt Readings from Last 3 Encounters:  05/14/22 187 lb 6.4 oz (85 kg)  02/11/22 188 lb 3.2 oz (85.4 kg)  10/10/21 188 lb (85.3 kg)    Management since that visit includes continue synthroid daily. He reports excellent compliance with treatment. He is not having side effects.   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   atorvastatin (LIPITOR) 20 MG tablet TAKE 1 TABLET BY MOUTH  DAILY AT 6 PM   cholecalciferol (VITAMIN D) 1000 UNITS tablet Take by mouth.   Coenzyme Q10 100 MG TABS Take by mouth.   empagliflozin (JARDIANCE) 25 MG TABS tablet Take 1 tablet (25 mg total) by mouth daily.   ezetimibe (ZETIA) 10 MG  tablet Take 1 tablet (10 mg total) by mouth daily.   Ferrous Sulfate (IRON PO) Take 325 mg by mouth daily.   fluticasone (FLONASE) 50 MCG/ACT nasal spray Place 2 sprays into both nostrils daily.   glucose blood (ONETOUCH ULTRA) test strip CHECK BLOOD SUGAR ONCE  DAILY   metoprolol succinate (TOPROL-XL) 100 MG 24 hr tablet TAKE 1 TABLET BY MOUTH AS DIRECTED WITH OR IMMEDIATELY FOLLOWING A MEAL   nitroGLYCERIN (NITROSTAT) 0.4 MG SL tablet Place 1 tablet (0.4 mg total) under the tongue every 5 (five) minutes as needed for chest pain.   pyridOXINE (VITAMIN B-6) 100 MG tablet Take 100 mg by mouth daily.   sertraline (ZOLOFT) 100 MG tablet Take 1 tablet (100 mg total) by mouth daily.   [DISCONTINUED] levothyroxine (SYNTHROID) 75 MCG tablet Take 1 tablet (75 mcg total) by mouth daily before breakfast.   [DISCONTINUED] metFORMIN (GLUCOPHAGE) 1000 MG tablet TAKE 1 TABLET(1000 MG) BY MOUTH TWICE DAILY WITH A MEAL   [DISCONTINUED] pioglitazone (ACTOS) 45 MG tablet TAKE 1 TABLET(45 MG) BY MOUTH DAILY   [DISCONTINUED] temazepam (RESTORIL) 30 MG capsule Take 1 capsule (30 mg total) by mouth at  bedtime.   No facility-administered medications prior to visit.    Review of Systems     Objective    BP 118/84 (BP Location: Left Arm, Patient Position: Sitting, Cuff Size: Normal)   Pulse 80   Temp 97.8 F (36.6 C) (Oral)   Resp 16   Wt 187 lb 6.4 oz (85 kg)   BMI 26.14 kg/m    Physical Exam Vitals reviewed.  Constitutional:      General: He is not in acute distress.    Appearance: Normal appearance. He is not ill-appearing, toxic-appearing or diaphoretic.  Eyes:     Conjunctiva/sclera: Conjunctivae normal.  Cardiovascular:     Rate and Rhythm: Normal rate and regular rhythm.     Pulses: Normal pulses.          Dorsalis pedis pulses are 2+ on the right side and 2+ on the left side.       Posterior tibial pulses are 2+ on the right side and 2+ on the left side.     Heart sounds: Normal heart  sounds. No murmur heard.    No friction rub. No gallop.  Pulmonary:     Effort: Pulmonary effort is normal. No respiratory distress.     Breath sounds: Normal breath sounds. No stridor. No wheezing, rhonchi or rales.  Abdominal:     General: Bowel sounds are normal. There is no distension.     Palpations: Abdomen is soft.     Tenderness: There is no abdominal tenderness.  Musculoskeletal:     Right lower leg: No edema.     Left lower leg: No edema.     Right foot: Normal range of motion. No deformity or bunion.     Left foot: Normal range of motion. No deformity or bunion.  Feet:     Right foot:     Protective Sensation: 6 sites tested.  6 sites sensed.     Skin integrity: Skin integrity normal. No erythema.     Toenail Condition: Right toenails are normal.     Left foot:     Protective Sensation: 6 sites tested.  6 sites sensed.     Skin integrity: Skin integrity normal. No erythema.     Toenail Condition: Left toenails are normal.  Skin:    Findings: No erythema or rash.  Neurological:     Mental Status: He is alert and oriented to person, place, and time.       No results found for any visits on 05/14/22.  Assessment & Plan     Problem List Items Addressed This Visit       Cardiovascular and Mediastinum   Essential (primary) hypertension    Controlled BP at goal Continue current medications at current doses No medications changes today  CMP collected today       Relevant Orders   Comprehensive metabolic panel     Endocrine   Diabetes mellitus with hyperglycemia - Primary    Foot exam completed today  Last A1c was not at goal  Pt will continue current medications  A1c collected today        Relevant Medications   pioglitazone (ACTOS) 45 MG tablet   metFORMIN (GLUCOPHAGE) 1000 MG tablet   Other Relevant Orders   Hemoglobin A1c   Adult hypothyroidism    Chronic problem  Stable  Continue current medications at current doses  No medication changes  today  TSH, free T4 collected today  Refills for synthroid provided  Relevant Medications   levothyroxine (SYNTHROID) 75 MCG tablet   Other Relevant Orders   TSH     Other   HLD (hyperlipidemia)    Chronic problem  Stable  Continue statin and Zetia at current doses  No medication changes today   Lipid panel collected today, fasting         Relevant Orders   Lipid panel   Insomnia    Chronic  Stable on restoril 30mg   Provided refills today  He will continue this medication  Reports symptoms well controlled on medication       Relevant Medications   temazepam (RESTORIL) 30 MG capsule   Avitaminosis D    Chronic problem  Stable  Continue Vitamin D 1000units daily  No medication changes today  Will collect Vitamin D levels today          Relevant Orders   Vitamin D (25 hydroxy)   Other Visit Diagnoses     Well controlled diabetes mellitus       Relevant Medications   pioglitazone (ACTOS) 45 MG tablet   metFORMIN (GLUCOPHAGE) 1000 MG tablet        Return in about 3 months (around 08/13/2022) for DM.        The entirety of the information documented in the History of Present Illness, Review of Systems and Physical Exam were personally obtained by me. Portions of this information were initially documented by Hetty Ely, CMA . I, Ronnald Ramp, MD have reviewed the documentation above for thoroughness and accuracy.      Ronnald Ramp, MD  [facility] [phone] (phone) [phone] (fax)  [facility]

## 2022-05-14 NOTE — Assessment & Plan Note (Signed)
Chronic problem  Stable  Continue current medications at current doses  No medication changes today  TSH, free T4 collected today  Refills for synthroid provided

## 2022-05-14 NOTE — Assessment & Plan Note (Signed)
Chronic  Stable on restoril   Provided refills today  He will continue this medication  Reports symptoms well controlled on medication

## 2022-05-14 NOTE — Assessment & Plan Note (Signed)
Controlled BP at goal Continue current medications at current doses No medications changes today  CMP collected today  

## 2022-05-14 NOTE — Assessment & Plan Note (Signed)
Foot exam completed today  Last A1c was not at goal  Pt will continue current medications  A1c collected today

## 2022-05-14 NOTE — Assessment & Plan Note (Signed)
Chronic problem  Stable  Continue statin and Zetia at current doses  No medication changes today   Lipid panel collected today, fasting

## 2022-05-14 NOTE — Patient Instructions (Addendum)
We will follow up with results of labs once they are available.  - We will check your A1c to see how your diabetes is doing   Your blood pressure is well controlled   Please continue your medications as prescribed   I have provided refills as requested

## 2022-05-14 NOTE — Assessment & Plan Note (Signed)
Chronic problem  Stable  Continue Vitamin D 1000units daily  No medication changes today  Will collect Vitamin D levels today

## 2022-05-15 LAB — LIPID PANEL
Chol/HDL Ratio: 3 ratio (ref 0.0–5.0)
Cholesterol, Total: 150 mg/dL (ref 100–199)
HDL: 50 mg/dL (ref >39)
LDL Chol Calc (NIH): 55 mg/dL (ref 0–99)
Triglycerides: 288 mg/dL — ABNORMAL HIGH (ref 0–149)
VLDL Cholesterol Cal: 45 mg/dL — ABNORMAL HIGH (ref 5–40)

## 2022-05-15 LAB — HEMOGLOBIN A1C
Est. average glucose Bld gHb Est-mCnc: 183 mg/dL
Hgb A1c MFr Bld: 8 % — ABNORMAL HIGH (ref 4.8–5.6)

## 2022-05-15 LAB — COMPREHENSIVE METABOLIC PANEL
ALT: 33 IU/L (ref 0–44)
AST: 30 IU/L (ref 0–40)
Albumin/Globulin Ratio: 1.8 (ref 1.2–2.2)
Albumin: 4.6 g/dL (ref 3.9–4.9)
Alkaline Phosphatase: 102 IU/L (ref 44–121)
BUN/Creatinine Ratio: 13 (ref 10–24)
BUN: 13 mg/dL (ref 8–27)
Bilirubin Total: 0.4 mg/dL (ref 0.0–1.2)
CO2: 22 mmol/L (ref 20–29)
Calcium: 10.2 mg/dL (ref 8.6–10.2)
Chloride: 103 mmol/L (ref 96–106)
Creatinine, Ser: 1.04 mg/dL (ref 0.76–1.27)
Globulin, Total: 2.5 g/dL (ref 1.5–4.5)
Glucose: 166 mg/dL — ABNORMAL HIGH (ref 70–99)
Potassium: 5 mmol/L (ref 3.5–5.2)
Sodium: 139 mmol/L (ref 134–144)
Total Protein: 7.1 g/dL (ref 6.0–8.5)
eGFR: 79 mL/min/{1.73_m2} (ref >59)

## 2022-05-15 LAB — TSH: TSH: 4.37 u[IU]/mL (ref 0.450–4.500)

## 2022-05-15 LAB — VITAMIN D 25 HYDROXY (VIT D DEFICIENCY, FRACTURES): Vit D, 25-Hydroxy: 48.6 ng/mL (ref 30.0–100.0)

## 2022-06-14 ENCOUNTER — Other Ambulatory Visit: Payer: Self-pay | Admitting: Family Medicine

## 2022-06-14 DIAGNOSIS — G4709 Other insomnia: Secondary | ICD-10-CM

## 2022-06-14 MED ORDER — TEMAZEPAM 30 MG PO CAPS: 30.0000 mg | ORAL_CAPSULE | Freq: Every day | ORAL | 0 refills | Status: DC

## 2022-06-26 ENCOUNTER — Ambulatory Visit (INDEPENDENT_AMBULATORY_CARE_PROVIDER_SITE_OTHER): Payer: Medicare Other

## 2022-06-26 VITALS — Ht 71.0 in | Wt 187.0 lb

## 2022-06-26 DIAGNOSIS — Z0000 Encounter for general adult medical examination without abnormal findings: Secondary | ICD-10-CM | POA: Diagnosis not present

## 2022-06-26 NOTE — Progress Notes (Signed)
I connected with  [name] [name] on 06/26/22 by a audio enabled telemedicine application and verified that I am speaking with the correct person using two identifiers.  Patient Location: Home  Provider Location: Office/Clinic  I discussed the limitations of evaluation and management by telemedicine. The patient expressed understanding and agreed to proceed.  Subjective:   [name] [name] is a 67 y.o. male who presents for Medicare Annual/Subsequent preventive examination.  Review of Systems    Cardiac Risk Factors include: advanced age (>69men, >67 women);diabetes mellitus;dyslipidemia;hypertension;male gender     Objective:    Today's Vitals   06/26/22 0912  Weight: 187 lb (84.8 kg)  Height: 5\' 11"  (1.803 m)   Body mass index is 26.08 kg/m.     06/26/2022    9:24 AM 04/23/2018    2:43 PM 08/21/2016    8:08 AM 04/17/2016    9:09 AM 04/25/2015    8:41 AM  Advanced Directives  Does Patient Have a Medical Advance Directive? Yes No Yes Yes Yes  Type of Estate agent of [place];Living will  Living will  Healthcare Power of [place];Living will  Does patient want to make changes to medical advance directive?     No - Patient declined  Would patient like information on creating a medical advance directive?  No - Patient declined       Current Medications (verified) Outpatient Encounter Medications as of 06/26/2022  Medication Sig   aspirin 81 MG tablet Take by mouth.   atorvastatin (LIPITOR) 20 MG tablet TAKE 1 TABLET BY MOUTH  DAILY AT 6 PM   cholecalciferol (VITAMIN D) 1000 UNITS tablet Take by mouth.   Coenzyme Q10 100 MG TABS Take by mouth.   empagliflozin (JARDIANCE) 25 MG TABS tablet Take 1 tablet (25 mg total) by mouth daily.   ezetimibe (ZETIA) 10 MG tablet Take 1 tablet (10 mg total) by mouth daily.   Ferrous Sulfate (IRON PO) Take 325 mg by mouth daily.   fluticasone (FLONASE) 50 MCG/ACT nasal spray Place 2 sprays into both nostrils daily.    glucose blood (ONETOUCH ULTRA) test strip CHECK BLOOD SUGAR ONCE  DAILY   levothyroxine (SYNTHROID) 75 MCG tablet Take 1 tablet (75 mcg total) by mouth daily before breakfast.   metFORMIN (GLUCOPHAGE) 1000 MG tablet TAKE 1 TABLET(1000 MG) BY MOUTH TWICE DAILY WITH A MEAL   metoprolol succinate (TOPROL-XL) 100 MG 24 hr tablet TAKE 1 TABLET BY MOUTH AS DIRECTED WITH OR IMMEDIATELY FOLLOWING A MEAL   nitroGLYCERIN (NITROSTAT) 0.4 MG SL tablet Place 1 tablet (0.4 mg total) under the tongue every 5 (five) minutes as needed for chest pain.   pioglitazone (ACTOS) 45 MG tablet TAKE 1 TABLET(45 MG) BY MOUTH DAILY   pyridOXINE (VITAMIN B-6) 100 MG tablet Take 100 mg by mouth daily.   sertraline (ZOLOFT) 100 MG tablet Take 1 tablet (100 mg total) by mouth daily.   temazepam (RESTORIL) 30 MG capsule Take 1 capsule (30 mg total) by mouth at bedtime.   No facility-administered encounter medications on file as of 06/26/2022.    Allergies (verified) Enalapril   History: Past Medical History:  Diagnosis Date   A-fib (HCC)    Coronary artery disease    Diabetes mellitus without complication (HCC)    Hyperlipidemia    Hypertension    Raynaud's disease    Thyroid disease    Past Surgical History:  Procedure Laterality Date   CARDIAC CATHETERIZATION     CARDIAC SURGERY  05/30/2005  stent placement   Family History  Problem Relation Age of Onset   Diabetes Mother    Heart disease Mother    Hypertension Mother    Skin cancer Mother    Glaucoma Mother    Ulcers Mother    Gout Mother    Heart disease Father    Hypertension Father    Prostate cancer Father    Hypertension Sister    Hypertension Sister    Social History   Socioeconomic History   Marital status: Married    Spouse name: Not on file   Number of children: Not on file   Years of education: Not on file   Highest education level: Not on file  Occupational History   Not on file  Tobacco Use   Smoking status: Never    Smokeless tobacco: Never  Vaping Use   Vaping Use: Never used  Substance and Sexual Activity   Alcohol use: Yes    Comment: 2-3 beer on the weekends   Drug use: No   Sexual activity: Yes  Other Topics Concern   Not on file  Social History Narrative   Not on file   Social Determinants of Health   Financial Resource Strain: Low Risk  (06/26/2022)   Overall Financial Resource Strain (CARDIA)    Difficulty of Paying Living Expenses: Not hard at all  Food Insecurity: No Food Insecurity (06/26/2022)   Hunger Vital Sign    Worried About Running Out of Food in the Last Year: Never true    Ran Out of Food in the Last Year: Never true  Transportation Needs: No Transportation Needs (06/26/2022)   PRAPARE - Administrator, Civil Service (Medical): No    Lack of Transportation (Non-Medical): No  Physical Activity: Sufficiently Active (06/26/2022)   Exercise Vital Sign    Days of Exercise per Week: 5 days    Minutes of Exercise per Session: 30 min  Stress: No Stress Concern Present (06/26/2022)   [employer] of Occupational Health - Occupational Stress Questionnaire    Feeling of Stress : Not at all  Social Connections: Moderately Integrated (06/26/2022)   Social Connection and Isolation Panel [NHANES]    Frequency of Communication with Friends and Family: Three times a week    Frequency of Social Gatherings with Friends and Family: Three times a week    Attends Religious Services: More than 4 times per year    Active Member of Clubs or Organizations: No    Attends Banker Meetings: Never    Marital Status: Married    Tobacco Counseling Counseling given: Not Answered   Clinical Intake:  Pre-visit preparation completed: Yes  Pain : No/denies pain     BMI - recorded: 26.08 Nutritional Status: BMI 25 -29 Overweight Nutritional Risks: None Diabetes: Yes CBG done?: Yes (130 this am at home) CBG resulted in Enter/ Edit results?: No Did pt. bring in CBG  monitor from home?: No  How often do you need to have someone help you when you read instructions, pamphlets, or other written materials from your doctor or pharmacy?: 1 - Never  Diabetic?yes  Interpreter Needed?: No  Comments: lives with wife Information entered by :: B.Nichelle Renwick,LPN   Activities of Daily Living    06/26/2022    9:25 AM 02/11/2022    9:23 AM  In your present state of health, do you have any difficulty performing the following activities:  Hearing? 1 1  Vision? 0 0  Difficulty concentrating or  making decisions? 0 0  Walking or climbing stairs? 0 0  Dressing or bathing? 0 0  Doing errands, shopping? 0 0  Preparing Food and eating ? N   Using the Toilet? N   In the past six months, have you accidently leaked urine? N   Do you have problems with loss of bowel control? N   Managing your Medications? N   Managing your Finances? N   Housekeeping or managing your Housekeeping? N     Patient Care Team: Ronnald Ramp, MD as PCP - General (Family Medicine) [place], [facility] (Optometry)  Indicate any recent Medical Services you may have received from other than [facility] providers in the past year (date may be approximate).     Assessment:   This is a routine wellness examination for [place].  Hearing/Vision screen Hearing Screening - Comments:: Adequate hearing w/hearing aides Vision Screening - Comments:: Adequate vision;readers only [facility]-every October  Dietary issues and exercise activities discussed: Current Exercise Habits: The patient has a physically strenuous job, but has no regular exercise apart from work. (pt very active with gardens and field work), Exercise limited by: cardiac condition(s)   Goals Addressed   None    Depression Screen    06/26/2022    9:22 AM 05/14/2022    8:10 AM 02/11/2022    9:23 AM 10/10/2021    8:34 AM 05/31/2021   10:29 AM 03/08/2021   10:40 AM 01/09/2021    8:38 AM  PHQ 2/9 Scores  PHQ - 2 Score 0 0 0 0  0 1 0  PHQ- 9 Score  1 1 2 1 3 1     Fall Risk    06/26/2022    9:20 AM 02/11/2022    9:22 AM 10/10/2021    8:33 AM 05/31/2021   10:29 AM 01/09/2021    8:38 AM  Fall Risk   Falls in the past year? 0 0 0 0 0  Number falls in past yr: 0 0 0  0  Injury with Fall? 0 0 0  0  Risk for fall due to : No Fall Risks No Fall Risks No Fall Risks  No Fall Risks  Follow up Education provided;Falls prevention discussed  Falls evaluation completed  Falls evaluation completed    FALL RISK PREVENTION PERTAINING TO THE HOME:  Any stairs in or around the home? Yes  If so, are there any without handrails? Yes  Home free of loose throw rugs in walkways, pet beds, electrical cords, etc? Yes  Adequate lighting in your home to reduce risk of falls? Yes   ASSISTIVE DEVICES UTILIZED TO PREVENT FALLS:  Life alert? No  Use of a cane, walker or w/c? No  Grab bars in the bathroom? Yes  Shower chair or bench in shower? No  Elevated toilet seat or a handicapped toilet? No   Cognitive Function:    03/08/2021   11:12 AM  MMSE - Mini Mental State Exam  Orientation to time 4  Orientation to Place 5  Registration 3  Attention/ Calculation 5  Recall 3  Language- name 2 objects 2  Language- repeat 1  Language- follow 3 step command 3  Language- read & follow direction 0  Write a sentence 1  Copy design 1  Total score 28        06/26/2022    9:33 AM  6CIT Screen  What Year? 0 points  What month? 0 points  What time? 0 points  Count back from  20 0 points  Months in reverse 0 points  Repeat phrase 0 points  Total Score 0 points    Immunizations Immunization History  Administered Date(s) Administered   Fluad Quad(high Dose 65+) 12/15/2018, 01/09/2021, 10/10/2021   Influenza Split 12/04/2010, 12/03/2011   Influenza,inj,Quad PF,6+ Mos 01/05/2013, 10/27/2013, 12/25/2015, 12/16/2016, 10/16/2017   PFIZER(Purple Top)SARS-COV-2 Vaccination 04/21/2019, 05/12/2019, 03/08/2020   PNEUMOCOCCAL CONJUGATE-20  01/09/2021   Pneumococcal Conjugate-13 01/05/2013   Tdap 02/09/2008, 09/06/2020    TDAP status: Up to date  Flu Vaccine status: Up to date  Pneumococcal vaccine status: Up to date  Covid-19 vaccine status: Completed vaccines  Qualifies for Shingles Vaccine? Yes   Zostavax completed No   Shingrix Completed?: No.    Education has been provided regarding the importance of this vaccine. Patient has been advised to call insurance company to determine out of pocket expense if they have not yet received this vaccine. Advised may also receive vaccine at local pharmacy or Health Dept. Verbalized acceptance and understanding.  Screening Tests Health Maintenance  Topic Date Due   Zoster Vaccines- Shingrix (1 of 2) Never done   COVID-19 Vaccine (4 - 2023-24 season) 09/28/2021   INFLUENZA VACCINE  08/29/2022   HEMOGLOBIN A1C  11/13/2022   OPHTHALMOLOGY EXAM  11/15/2022   Diabetic kidney evaluation - Urine ACR  02/12/2023   Diabetic kidney evaluation - eGFR measurement  05/14/2023   FOOT EXAM  05/14/2023   Medicare Annual Wellness (AWV)  06/26/2023   Colonoscopy  11/30/2023   DTaP/Tdap/Td (3 - Td or Tdap) 09/07/2030   Pneumonia Vaccine 3+ Years old  Completed   Hepatitis C Screening  Completed   HPV VACCINES  Aged Out    Health Maintenance  Health Maintenance Due  Topic Date Due   Zoster Vaccines- Shingrix (1 of 2) Never done   COVID-19 Vaccine (4 - 2023-24 season) 09/28/2021    Colorectal cancer screening: Type of screening: Colonoscopy. Completed yes. Repeat every 10 years  Lung Cancer Screening: (Low Dose CT Chest recommended if Age 67-80 years, 30 pack-year currently smoking OR have quit w/in 15years.) does not qualify.   Lung Cancer Screening Referral: no  Additional Screening:  Hepatitis C Screening: does not qualify; Completed yes  Vision Screening: Recommended annual ophthalmology exams for early detection of glaucoma and other disorders of the eye. Is the patient up  to date with their annual eye exam?  Yes  Who is the provider or what is the name of the office in which the patient attends annual eye exams? [facility] If pt is not established with a provider, would they like to be referred to a provider to establish care? No .   Dental Screening: Recommended annual dental exams for proper oral hygiene  Community Resource Referral / Chronic Care Management: CRR required this visit?  No   CCM required this visit?  No      Plan:     I have personally reviewed and noted the following in the patient's chart:   Medical and social history Use of alcohol, tobacco or illicit drugs  Current medications and supplements including opioid prescriptions. Patient is not currently taking opioid prescriptions. Functional ability and status Nutritional status Physical activity Advanced directives List of other physicians Hospitalizations, surgeries, and ER visits in previous 12 months Vitals Screenings to include cognitive, depression, and falls Referrals and appointments  In addition, I have reviewed and discussed with patient certain preventive protocols, quality metrics, and best practice recommendations. A written personalized care plan  for preventive services as well as general preventive health recommendations were provided to patient.     Sue Lush, LPN   1/61/0960   Nurse Notes: The patient states he is doing well. 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 Pt has no concerns or questions at this time.

## 2022-06-26 NOTE — Patient Instructions (Signed)
[name] , Thank you for taking time to come for your Medicare Wellness Visit. I appreciate your ongoing commitment to your health goals. Please review the following plan we discussed and let me know if I can assist you in the future.   These are the goals we discussed:  Goals   None     This is a list of the screening recommended for you and due dates:  Health Maintenance  Topic Date Due   Zoster (Shingles) Vaccine (1 of 2) Never done   COVID-19 Vaccine (4 - 2023-24 season) 09/28/2021   Flu Shot  08/29/2022   Hemoglobin A1C  11/13/2022   Eye exam for diabetics  11/15/2022   Yearly kidney health urinalysis for diabetes  02/12/2023   Yearly kidney function blood test for diabetes  05/14/2023   Complete foot exam   05/14/2023   Medicare Annual Wellness Visit  06/26/2023   Colon Cancer Screening  11/30/2023   DTaP/Tdap/Td vaccine (3 - Td or Tdap) 09/07/2030   Pneumonia Vaccine  Completed   Hepatitis C Screening  Completed   HPV Vaccine  Aged Out    Advanced directives: yes  Conditions/risks identified: none  Next appointment: Follow up in one year for your annual wellness visit. 07/01/2023 @ 9:15am telephone  Preventive Care 65 Years and Older, Male  Preventive care refers to lifestyle choices and visits with your health care provider that can promote health and wellness. What does preventive care include? A yearly physical exam. This is also called an annual well check. Dental exams once or twice a year. Routine eye exams. Ask your health care provider how often you should have your eyes checked. Personal lifestyle choices, including: Daily care of your teeth and gums. Regular physical activity. Eating a healthy diet. Avoiding tobacco and drug use. Limiting alcohol use. Practicing safe sex. Taking low doses of aspirin every day. Taking vitamin and mineral supplements as recommended by your health care provider. What happens during an annual well check? The services and  screenings done by your health care provider during your annual well check will depend on your age, overall health, lifestyle risk factors, and family history of disease. Counseling  Your health care provider may ask you questions about your: Alcohol use. Tobacco use. Drug use. Emotional well-being. Home and relationship well-being. Sexual activity. Eating habits. History of falls. Memory and ability to understand (cognition). Work and work Astronomer. Screening  You may have the following tests or measurements: Height, weight, and BMI. Blood pressure. Lipid and cholesterol levels. These may be checked every 5 years, or more frequently if you are over 64 years old. Skin check. Lung cancer screening. You may have this screening every year starting at age 47 if you have a 30-pack-year history of smoking and currently smoke or have quit within the past 15 years. Fecal occult blood test (FOBT) of the stool. You may have this test every year starting at age 55. Flexible sigmoidoscopy or colonoscopy. You may have a sigmoidoscopy every 5 years or a colonoscopy every 10 years starting at age 19. Prostate cancer screening. Recommendations will vary depending on your family history and other risks. Hepatitis C blood test. Hepatitis B blood test. Sexually transmitted disease (STD) testing. Diabetes screening. This is done by checking your blood sugar (glucose) after you have not eaten for a while (fasting). You may have this done every 1-3 years. Abdominal aortic aneurysm (AAA) screening. You may need this if you are a current or former smoker.  Osteoporosis. You may be screened starting at age 22 if you are at high risk. Talk with your health care provider about your test results, treatment options, and if necessary, the need for more tests. Vaccines  Your health care provider may recommend certain vaccines, such as: Influenza vaccine. This is recommended every year. Tetanus, diphtheria, and  acellular pertussis (Tdap, Td) vaccine. You may need a Td booster every 10 years. Zoster vaccine. You may need this after age 56. Pneumococcal 13-valent conjugate (PCV13) vaccine. One dose is recommended after age 6. Pneumococcal polysaccharide (PPSV23) vaccine. One dose is recommended after age 83. Talk to your health care provider about which screenings and vaccines you need and how often you need them. This information is not intended to replace advice given to you by your health care provider. Make sure you discuss any questions you have with your health care provider. Document Released: 02/10/2015 Document Revised: 10/04/2015 Document Reviewed: 11/15/2014 Elsevier Interactive Patient Education  2017 [employer].  Fall Prevention in the Home Falls can cause injuries. They can happen to people of all ages. There are many things you can do to make your home safe and to help prevent falls. What can I do on the outside of my home? Regularly fix the edges of walkways and driveways and fix any cracks. Remove anything that might make you trip as you walk through a door, such as a raised step or threshold. Trim any bushes or trees on the path to your home. Use bright outdoor lighting. Clear any walking paths of anything that might make someone trip, such as rocks or tools. Regularly check to see if handrails are loose or broken. Make sure that both sides of any steps have handrails. Any raised decks and porches should have guardrails on the edges. Have any leaves, snow, or ice cleared regularly. Use sand or salt on walking paths during winter. Clean up any spills in your garage right away. This includes oil or grease spills. What can I do in the bathroom? Use night lights. Install grab bars by the toilet and in the tub and shower. Do not use towel bars as grab bars. Use non-skid mats or decals in the tub or shower. If you need to sit down in the shower, use a plastic, non-slip stool. Keep  the floor dry. Clean up any water that spills on the floor as soon as it happens. Remove soap buildup in the tub or shower regularly. Attach bath mats securely with double-sided non-slip rug tape. Do not have throw rugs and other things on the floor that can make you trip. What can I do in the bedroom? Use night lights. Make sure that you have a light by your bed that is easy to reach. Do not use any sheets or blankets that are too big for your bed. They should not hang down onto the floor. Have a firm chair that has side arms. You can use this for support while you get dressed. Do not have throw rugs and other things on the floor that can make you trip. What can I do in the kitchen? Clean up any spills right away. Avoid walking on wet floors. Keep items that you use a lot in easy-to-reach places. If you need to reach something above you, use a strong step stool that has a grab bar. Keep electrical cords out of the way. Do not use floor polish or wax that makes floors slippery. If you must use wax, use non-skid floor  wax. Do not have throw rugs and other things on the floor that can make you trip. What can I do with my stairs? Do not leave any items on the stairs. Make sure that there are handrails on both sides of the stairs and use them. Fix handrails that are broken or loose. Make sure that handrails are as long as the stairways. Check any carpeting to make sure that it is firmly attached to the stairs. Fix any carpet that is loose or worn. Avoid having throw rugs at the top or bottom of the stairs. If you do have throw rugs, attach them to the floor with carpet tape. Make sure that you have a light switch at the top of the stairs and the bottom of the stairs. If you do not have them, ask someone to add them for you. What else can I do to help prevent falls? Wear shoes that: Do not have high heels. Have rubber bottoms. Are comfortable and fit you well. Are closed at the toe. Do not  wear sandals. If you use a stepladder: Make sure that it is fully opened. Do not climb a closed stepladder. Make sure that both sides of the stepladder are locked into place. Ask someone to hold it for you, if possible. Clearly mark and make sure that you can see: Any grab bars or handrails. First and last steps. Where the edge of each step is. Use tools that help you move around (mobility aids) if they are needed. These include: Canes. Walkers. Scooters. Crutches. Turn on the lights when you go into a dark area. Replace any light bulbs as soon as they burn out. Set up your furniture so you have a clear path. Avoid moving your furniture around. If any of your floors are uneven, fix them. If there are any pets around you, be aware of where they are. Review your medicines with your doctor. Some medicines can make you feel dizzy. This can increase your chance of falling. Ask your doctor what other things that you can do to help prevent falls. This information is not intended to replace advice given to you by your health care provider. Make sure you discuss any questions you have with your health care provider. Document Released: 11/10/2008 Document Revised: 06/22/2015 Document Reviewed: 02/18/2014 Elsevier Interactive Patient Education  2017 [employer].

## 2022-07-16 ENCOUNTER — Other Ambulatory Visit: Payer: Self-pay | Admitting: Family Medicine

## 2022-07-16 DIAGNOSIS — G4709 Other insomnia: Secondary | ICD-10-CM

## 2022-07-16 NOTE — Telephone Encounter (Signed)
Medication Refill - Medication: temazepam (RESTORIL) 30 MG capsule   Has the patient contacted their pharmacy? no (Agent: If no, request that the patient contact the pharmacy for the refill. If patient does not wish to contact the pharmacy document the reason why and proceed with request.) (Agent: If yes, when and what did the pharmacy advise?)pt called directly in because this is controlled substance  Preferred Pharmacy (with phone number or street name):  [id number] [place], [place] - [address] AT [facility] OF [address] & [address] [place] Phone: [phone]  Fax: [phone]     Has the patient been seen for an appointment in the last year OR does the patient have an upcoming appointment? yes  Agent: Please be advised that RX refills may take up to 3 business days. We ask that you follow-up with your pharmacy.

## 2022-07-17 MED ORDER — TEMAZEPAM 30 MG PO CAPS: 30.0000 mg | ORAL_CAPSULE | Freq: Every day | ORAL | 0 refills | Status: DC

## 2022-07-17 NOTE — Telephone Encounter (Signed)
Requested medication (s) are due for refill today: yes  Requested medication (s) are on the active medication list: yes    Last refill: 06/14/22  #30  0 refills  Future visit scheduled yes 08/14/22  Notes to clinic:Not delegated, please review. Thank you.  Requested Prescriptions  Pending Prescriptions Disp Refills   temazepam (RESTORIL) 30 MG capsule 30 capsule 0    Sig: Take 1 capsule (30 mg total) by mouth at bedtime.     Not Delegated - Psychiatry: Anxiolytics/Hypnotics 2 Failed - 07/16/2022  4:11 PM      Failed - This refill cannot be delegated      Failed - Urine Drug Screen completed in last 360 days      Passed - Patient is not pregnant      Passed - Valid encounter within last 6 months    Recent Outpatient Visits           2 months ago Type 2 diabetes mellitus with hyperglycemia, without long-term current use of insulin (HCC)   [place] [facility] Simmons-Robinson, [place], MD   5 months ago Coronary artery disease of native artery of native heart with stable angina pectoris [facility])   [place] [facility] Simmons-Robinson, [place], MD   9 months ago Type 2 diabetes mellitus with other circulatory complication, without long-term current use of insulin (HCC)   [place] [facility] Bosie Clos, MD   1 year ago Annual physical exam   [place] [facility] Bosie Clos, MD   1 year ago Decreased hearing of both ears   [place] [facility] Bosie Clos, MD       Future Appointments             In 4 weeks Simmons-Robinson, Tawanna Cooler, MD [facility]

## 2022-08-13 NOTE — Progress Notes (Unsigned)
      Established patient visit   Patient: [name] [name]   DOB: 07/10/1955   67 y.o. Male  MRN: [id number] Visit Date: 08/14/2022  Today's healthcare provider: Ronnald Ramp, MD   No chief complaint on file.  Subjective      ***  Medications: Outpatient Medications Prior to Visit  Medication Sig   aspirin 81 MG tablet Take by mouth.   atorvastatin (LIPITOR) 20 MG tablet TAKE 1 TABLET BY MOUTH  DAILY AT 6 PM   cholecalciferol (VITAMIN D) 1000 UNITS tablet Take by mouth.   Coenzyme Q10 100 MG TABS Take by mouth.   empagliflozin (JARDIANCE) 25 MG TABS tablet Take 1 tablet (25 mg total) by mouth daily.   ezetimibe (ZETIA) 10 MG tablet Take 1 tablet (10 mg total) by mouth daily.   Ferrous Sulfate (IRON PO) Take 325 mg by mouth daily.   fluticasone (FLONASE) 50 MCG/ACT nasal spray Place 2 sprays into both nostrils daily.   glucose blood (ONETOUCH ULTRA) test strip CHECK BLOOD SUGAR ONCE  DAILY   levothyroxine (SYNTHROID) 75 MCG tablet Take 1 tablet (75 mcg total) by mouth daily before breakfast.   metFORMIN (GLUCOPHAGE) 1000 MG tablet TAKE 1 TABLET(1000 MG) BY MOUTH TWICE DAILY WITH A MEAL   metoprolol succinate (TOPROL-XL) 100 MG 24 hr tablet TAKE 1 TABLET BY MOUTH AS DIRECTED WITH OR IMMEDIATELY FOLLOWING A MEAL   nitroGLYCERIN (NITROSTAT) 0.4 MG SL tablet Place 1 tablet (0.4 mg total) under the tongue every 5 (five) minutes as needed for chest pain.   pioglitazone (ACTOS) 45 MG tablet TAKE 1 TABLET(45 MG) BY MOUTH DAILY   pyridOXINE (VITAMIN B-6) 100 MG tablet Take 100 mg by mouth daily.   sertraline (ZOLOFT) 100 MG tablet Take 1 tablet (100 mg total) by mouth daily.   temazepam (RESTORIL) 30 MG capsule Take 1 capsule (30 mg total) by mouth at bedtime.   No facility-administered medications prior to visit.    Review of Systems  {Insert previous labs (optional):23779}  {See past labs  Heme  Chem  Endocrine  Serology  Results Review (optional):1}   Objective     There were no vitals taken for this visit. {Insert last BP/Wt (optional):23777}  {See vitals history (optional):1}  Physical Exam  ***  No results found for any visits on 08/14/22.  Assessment & Plan     Problem List Items Addressed This Visit   None    No follow-ups on file.         Ronnald Ramp, MD  [facility] [phone] (phone) [phone] (fax)  [facility]

## 2022-08-14 ENCOUNTER — Ambulatory Visit (INDEPENDENT_AMBULATORY_CARE_PROVIDER_SITE_OTHER): Payer: Medicare Other | Admitting: Family Medicine

## 2022-08-14 ENCOUNTER — Encounter: Payer: Self-pay | Admitting: Family Medicine

## 2022-08-14 VITALS — BP 110/86 | HR 94 | Temp 97.5°F | Resp 14 | Ht 71.0 in | Wt 187.8 lb

## 2022-08-14 DIAGNOSIS — E1165 Type 2 diabetes mellitus with hyperglycemia: Secondary | ICD-10-CM

## 2022-08-14 DIAGNOSIS — G4709 Other insomnia: Secondary | ICD-10-CM

## 2022-08-14 DIAGNOSIS — E785 Hyperlipidemia, unspecified: Secondary | ICD-10-CM | POA: Diagnosis not present

## 2022-08-14 MED ORDER — TEMAZEPAM 30 MG PO CAPS
30.0000 mg | ORAL_CAPSULE | Freq: Every day | ORAL | 0 refills | Status: DC
Start: 2022-08-14 — End: 2022-11-12

## 2022-08-14 MED ORDER — EZETIMIBE 10 MG PO TABS: 10.0000 mg | ORAL_TABLET | Freq: Every day | ORAL | 3 refills | Status: DC

## 2022-08-14 MED ORDER — ATORVASTATIN CALCIUM 20 MG PO TABS
ORAL_TABLET | ORAL | 3 refills | Status: AC
Start: 2022-08-14 — End: ?

## 2022-08-14 NOTE — Assessment & Plan Note (Addendum)
Chronic difficulty sleeping, improved with Temazepam. Patient reports significant sleep disruption when medication is missed. Discussed the importance of consistent dosing and the potential risks of controlled substances. -Chronic, well controlled on current regimen  -Continue Temazepam 30mg  qhs as needed for sleep. -Will provide a 90-day supply without refills, to be actively refilled every 3 months. -PDMP reviewed and appropriate  -counseled patient that given appropriate adherence over the last 6 months, I am willing to prescribe 3 month supply of this controlled substance

## 2022-08-14 NOTE — Assessment & Plan Note (Signed)
Patient reports fasting blood glucose levels consistently above target range despite adherence to Metformin, Jardiance, and Actos. Discussed concerns about Metformin related to a family member's cancer diagnosis. -Chronic -last A1c not at goal  -recommended fasting BG less than 120 as goal  -Order Hemoglobin A1c and lipid panel today. -Consider medication adjustments based on lab results. -Follow-up in 3 months to reassess diabetes control  management. If A1c improved, if A1c worsened will see him again in 1 month for mediation adjustment

## 2022-08-14 NOTE — Assessment & Plan Note (Signed)
On Lipitor and Zetia for cholesterol management. -Chronic  -Refilled Lipitor 20mg  and Zetia 10mg  for a 90-day supply -repeat lipid panel

## 2022-08-15 LAB — HEMOGLOBIN A1C
Est. average glucose Bld gHb Est-mCnc: 217 mg/dL
Hgb A1c MFr Bld: 9.2 % — ABNORMAL HIGH (ref 4.8–5.6)

## 2022-08-15 LAB — LIPID PANEL
Chol/HDL Ratio: 3.6 ratio (ref 0.0–5.0)
Cholesterol, Total: 135 mg/dL (ref 100–199)
HDL: 38 mg/dL — ABNORMAL LOW (ref >39)
LDL Chol Calc (NIH): 46 mg/dL (ref 0–99)
Triglycerides: 343 mg/dL — ABNORMAL HIGH (ref 0–149)
VLDL Cholesterol Cal: 51 mg/dL — ABNORMAL HIGH (ref 5–40)

## 2022-08-16 ENCOUNTER — Telehealth: Payer: Self-pay | Admitting: *Deleted

## 2022-08-16 NOTE — Telephone Encounter (Signed)
Patient called for results and notified: Your LDL (bad cholesterol) was 46 was at goal of less than 55 Your HDL (good cholesterol) was low 38, goal is greater than 40 Your triglycerides were elevated at 343, goal is less than 150 - recommend trying fish oil OTC to help along with dietary recs below     To help improve these levels:   -Eat fewer foods with elevated glycemic content such as cooked potatoes, white bread, white rice and juices   -Exercise will help to improve these levels. Aim to get 30 minutes of activity every day.  Patient is very concerned that his A1c is elevated/higher- he is trying to watch his diet. Any other suggestions for that?

## 2022-08-28 DIAGNOSIS — K219 Gastro-esophageal reflux disease without esophagitis: Secondary | ICD-10-CM | POA: Diagnosis not present

## 2022-08-28 DIAGNOSIS — F3341 Major depressive disorder, recurrent, in partial remission: Secondary | ICD-10-CM | POA: Diagnosis not present

## 2022-11-12 ENCOUNTER — Encounter: Payer: Self-pay | Admitting: Family Medicine

## 2022-11-12 ENCOUNTER — Ambulatory Visit: Payer: Medicare Other | Admitting: Family Medicine

## 2022-11-12 VITALS — BP 114/86 | HR 68 | Temp 97.5°F | Resp 20 | Ht 70.0 in | Wt 186.4 lb

## 2022-11-12 DIAGNOSIS — Z8679 Personal history of other diseases of the circulatory system: Secondary | ICD-10-CM

## 2022-11-12 DIAGNOSIS — F325 Major depressive disorder, single episode, in full remission: Secondary | ICD-10-CM

## 2022-11-12 DIAGNOSIS — I10 Essential (primary) hypertension: Secondary | ICD-10-CM | POA: Diagnosis not present

## 2022-11-12 DIAGNOSIS — G4709 Other insomnia: Secondary | ICD-10-CM

## 2022-11-12 DIAGNOSIS — E782 Mixed hyperlipidemia: Secondary | ICD-10-CM | POA: Diagnosis not present

## 2022-11-12 DIAGNOSIS — E039 Hypothyroidism, unspecified: Secondary | ICD-10-CM

## 2022-11-12 DIAGNOSIS — E559 Vitamin D deficiency, unspecified: Secondary | ICD-10-CM

## 2022-11-12 DIAGNOSIS — I25118 Atherosclerotic heart disease of native coronary artery with other forms of angina pectoris: Secondary | ICD-10-CM

## 2022-11-12 DIAGNOSIS — E1165 Type 2 diabetes mellitus with hyperglycemia: Secondary | ICD-10-CM

## 2022-11-12 DIAGNOSIS — Z23 Encounter for immunization: Secondary | ICD-10-CM | POA: Diagnosis not present

## 2022-11-12 DIAGNOSIS — F320 Major depressive disorder, single episode, mild: Secondary | ICD-10-CM

## 2022-11-12 DIAGNOSIS — E1159 Type 2 diabetes mellitus with other circulatory complications: Secondary | ICD-10-CM

## 2022-11-12 DIAGNOSIS — E119 Type 2 diabetes mellitus without complications: Secondary | ICD-10-CM

## 2022-11-12 LAB — POCT GLYCOSYLATED HEMOGLOBIN (HGB A1C): Hemoglobin A1C: 8.4 % — AB (ref 4.0–5.6)

## 2022-11-12 MED ORDER — SERTRALINE HCL 100 MG PO TABS
100.0000 mg | ORAL_TABLET | Freq: Every day | ORAL | 3 refills | Status: AC
Start: 2022-11-12 — End: ?

## 2022-11-12 MED ORDER — EMPAGLIFLOZIN 25 MG PO TABS
25.0000 mg | ORAL_TABLET | Freq: Every day | ORAL | 3 refills | Status: AC
Start: 2022-11-12 — End: ?

## 2022-11-12 MED ORDER — TEMAZEPAM 30 MG PO CAPS
30.0000 mg | ORAL_CAPSULE | Freq: Every day | ORAL | 0 refills | Status: DC
Start: 2022-11-12 — End: 2023-02-12

## 2022-11-12 MED ORDER — PIOGLITAZONE HCL 45 MG PO TABS
ORAL_TABLET | ORAL | 3 refills | Status: AC
Start: 2022-11-12 — End: ?

## 2022-11-12 MED ORDER — METFORMIN HCL 1000 MG PO TABS
ORAL_TABLET | ORAL | 3 refills | Status: AC
Start: 2022-11-12 — End: ?

## 2022-11-12 NOTE — Assessment & Plan Note (Signed)
Patient reports good sleep with Restoril 30mg . Chronic  Stable on current regimen  -Continue current regimen. PDMP reviewed  Refills provided

## 2022-11-12 NOTE — Assessment & Plan Note (Signed)
Chronic  Rate controlled on metoprolol 100mg   Continue current regimen, denies current symptoms of palpitations or DOE

## 2022-11-12 NOTE — Progress Notes (Signed)
Established patient visit   Patient: [name] [name]   DOB: 1955/05/05   67 y.o. Male  MRN: [id number] Visit Date: 11/12/2022  Today's healthcare provider: Ronnald Ramp, MD   Chief Complaint  Patient presents with   Follow-up    Check on A1C  Medication refill for Jardiance 25 mg, temazepam , sertaline HCL 100mg , pioglitazone 45mg , metformin 1000mg  [facility]   Subjective     HPI     Follow-up    Additional comments: Check on A1C  Medication refill for Jardiance 25 mg, temazepam , sertaline HCL 100mg , pioglitazone 45mg , metformin 1000mg  [facility]      Last edited by Ronnald Ramp, MD on 11/12/2022  9:34 AM.       Discussed the use of AI scribe software for clinical note transcription with the patient, who gave verbal consent to proceed.  History of Present Illness   The patient, with a history of diabetes, coronary artery disease, and hypothyroidism, presents for a general check-up and medication refills. He reports feeling "tolerable," with no new or worsening symptoms. He has been adhering to his medication regimen, which includes aspirin, atorvastatin, vitamin D, Jardiance, Zetia, ferrous sulfate, Flonase, Synthroid, metformin, metoprolol, Actos, Zoloft, and Restoril.  The patient's primary concern is his diabetes management. He admits to dietary indiscretions, particularly an increased intake of tomato sandwiches, which he believes may have negatively impacted his blood glucose control. His fasting blood glucose levels range from 105 to 180, depending on his dietary adherence. He expresses a desire to improve his diabetes control and lower his A1c, which had increased from 8 to 9 at his last visit.  The patient also mentions a history of COVID-19 infection, which resulted in significant shortness of breath and fatigue. He sought cardiology consultation due to these symptoms, but no cardiac abnormalities were identified. The  patient reports that his breathing has since improved.  In addition to his chronic conditions, the patient also reports occasional allergies, particularly when exposed to hay and dust in the field. He uses Flonase as needed for these symptoms. He also reports good sleep quality with the use of Restoril.  The patient has a family history of diabetes, with his mother suffering from severe diabetes and associated complications, including gout and stomach ulcers. He expresses a desire to better manage his own diabetes to avoid similar complications.  He reports no current visual symptoms. He also expresses some hesitancy about receiving further COVID-19 vaccinations due to his previous infection despite vaccination.         Past Medical History:  Diagnosis Date   [facility])    Coronary artery disease    Diabetes mellitus without complication (HCC)    Hyperlipidemia    Hypertension    Raynaud's disease    Thyroid disease     Medications: Outpatient Medications Prior to Visit  Medication Sig   aspirin 81 MG tablet Take by mouth.   atorvastatin (LIPITOR) 20 MG tablet TAKE 1 TABLET BY MOUTH  DAILY AT 6 PM   cholecalciferol (VITAMIN D) 1000 UNITS tablet Take by mouth.   Coenzyme Q10 100 MG TABS Take by mouth.   ezetimibe (ZETIA) 10 MG tablet Take 1 tablet (10 mg total) by mouth daily.   Ferrous Sulfate (IRON PO) Take 325 mg by mouth daily.   fluticasone (FLONASE) 50 MCG/ACT nasal spray Place 2 sprays into both nostrils daily.   glucose blood (ONETOUCH ULTRA) test strip CHECK BLOOD SUGAR ONCE  DAILY   levothyroxine (SYNTHROID) 75 MCG tablet Take 1 tablet (75 mcg total) by mouth daily before breakfast.   metoprolol succinate (TOPROL-XL) 100 MG 24 hr tablet TAKE 1 TABLET BY MOUTH AS DIRECTED WITH OR IMMEDIATELY FOLLOWING A MEAL   nitroGLYCERIN (NITROSTAT) 0.4 MG SL tablet Place 1 tablet (0.4 mg total) under the tongue every 5 (five) minutes as needed for chest pain.   pyridOXINE (VITAMIN  B-6) 100 MG tablet Take 100 mg by mouth daily.   [DISCONTINUED] empagliflozin (JARDIANCE) 25 MG TABS tablet Take 1 tablet (25 mg total) by mouth daily.   [DISCONTINUED] metFORMIN (GLUCOPHAGE) 1000 MG tablet TAKE 1 TABLET(1000 MG) BY MOUTH TWICE DAILY WITH A MEAL   [DISCONTINUED] pioglitazone (ACTOS) 45 MG tablet TAKE 1 TABLET(45 MG) BY MOUTH DAILY   [DISCONTINUED] sertraline (ZOLOFT) 100 MG tablet Take 1 tablet (100 mg total) by mouth daily.   [DISCONTINUED] temazepam (RESTORIL) 30 MG capsule Take 1 capsule (30 mg total) by mouth at bedtime.   No facility-administered medications prior to visit.    Review of Systems  Last metabolic panel Lab Results  Component Value Date   GLUCOSE 166 (H) 05/14/2022   NA 139 05/14/2022   K 5.0 05/14/2022   CL 103 05/14/2022   CO2 22 05/14/2022   BUN 13 05/14/2022   CREATININE 1.04 05/14/2022   EGFR 79 05/14/2022   CALCIUM 10.2 05/14/2022   PHOS 3.3 10/02/2017   PROT 7.1 05/14/2022   ALBUMIN 4.6 05/14/2022   LABGLOB 2.5 05/14/2022   AGRATIO 1.8 05/14/2022   BILITOT 0.4 05/14/2022   ALKPHOS 102 05/14/2022   AST 30 05/14/2022   ALT 33 05/14/2022   ANIONGAP 10 04/23/2018   Last lipids Lab Results  Component Value Date   CHOL 135 08/14/2022   HDL 38 (L) 08/14/2022   LDLCALC 46 08/14/2022   TRIG 343 (H) 08/14/2022   CHOLHDL 3.6 08/14/2022   Last hemoglobin A1c Lab Results  Component Value Date   HGBA1C 8.4 (A) 11/12/2022   Last thyroid functions Lab Results  Component Value Date   TSH 4.370 05/14/2022        Objective    BP 114/86   Pulse 68   Temp (!) 97.5 F (36.4 C)   Resp 20   Ht 5\' 10"  (1.778 m)   Wt 186 lb 6.4 oz (84.6 kg)   SpO2 98%   BMI 26.75 kg/m   BP Readings from Last 3 Encounters:  11/12/22 114/86  08/14/22 110/86  05/14/22 118/84   Wt Readings from Last 3 Encounters:  11/12/22 186 lb 6.4 oz (84.6 kg)  08/14/22 187 lb 12.8 oz (85.2 kg)  06/26/22 187 lb (84.8 kg)       Physical Exam Vitals  reviewed.  Constitutional:      General: He is not in acute distress.    Appearance: Normal appearance. He is not ill-appearing, toxic-appearing or diaphoretic.  Eyes:     Conjunctiva/sclera: Conjunctivae normal.  Cardiovascular:     Rate and Rhythm: Normal rate and regular rhythm.     Pulses: Normal pulses.     Heart sounds: Normal heart sounds. No murmur heard.    No friction rub. No gallop.  Pulmonary:     Effort: Pulmonary effort is normal. No respiratory distress.     Breath sounds: Normal breath sounds. No stridor. No wheezing, rhonchi or rales.  Abdominal:     General: Bowel sounds are normal. There is no distension.     Palpations: Abdomen  is soft.     Tenderness: There is no abdominal tenderness.  Musculoskeletal:     Right lower leg: No edema.     Left lower leg: No edema.  Skin:    Findings: No erythema or rash.  Neurological:     Mental Status: He is alert and oriented to person, place, and time.       Results for orders placed or performed in visit on 11/12/22  POCT glycosylated hemoglobin (Hb A1C)  Result Value Ref Range   Hemoglobin A1C 8.4 (A) 4.0 - 5.6 %   HbA1c POC (<> result, manual entry)     HbA1c, POC (prediabetic range)     HbA1c, POC (controlled diabetic range)      Assessment & Plan     Problem List Items Addressed This Visit     Adult hypothyroidism    Patient is on Synthroid daily. Chronic  Stable  -Continue current regimen.      Avitaminosis D   Coronary artery disease of native artery of native heart with stable angina pectoris (HCC)    Stable, no chest pain reported  No current symptoms of chest pain or shortness of breath. Patient is on Aspirin 81mg  daily and Metoprolol 100mg  daily.continue atorvastatin 20mg  every day   Followed by cardiology      Relevant Medications   sertraline (ZOLOFT) 100 MG tablet   Diabetes mellitus with hyperglycemia (HCC) - Primary    Recent increase in A1c from 8 to 9, possibly due to dietary  indiscretions. Fasting blood glucose this morning was 140. Patient has made dietary adjustments. Chronic, A1c not yet at goal  -Check point of care A1c today. -Continue current regimen of Jardiance 25mg  daily, Actos 45mg  daily, and Metformin 1000mg  twice daily. -refills provided       Relevant Medications   empagliflozin (JARDIANCE) 25 MG TABS tablet   metFORMIN (GLUCOPHAGE) 1000 MG tablet   pioglitazone (ACTOS) 45 MG tablet   Other Relevant Orders   POCT glycosylated hemoglobin (Hb A1C) (Completed)   Essential (primary) hypertension    Controlled BP at goal Continue metoprolol 100mg  daily  No medications changes today        History of atrial fibrillation    Chronic  Rate controlled on metoprolol 100mg   Continue current regimen, denies current symptoms of palpitations or DOE       HLD (hyperlipidemia)    Chronic  Last triglycerides were elevated at 343. Patient is on Atorvastatin 20mg  daily and Zetia 10mg  daily. Lipid panel ordered today       Relevant Orders   Lipid panel   Insomnia    Patient reports good sleep with Restoril 30mg . Chronic  Stable on current regimen  -Continue current regimen. PDMP reviewed  Refills provided       Relevant Medications   temazepam (RESTORIL) 30 MG capsule   Major depressive disorder, single episode, mild (HCC)   Relevant Medications   sertraline (ZOLOFT) 100 MG tablet   Other Visit Diagnoses     Type 2 diabetes mellitus with other circulatory complication, without long-term current use of insulin (HCC)       Relevant Medications   empagliflozin (JARDIANCE) 25 MG TABS tablet   metFORMIN (GLUCOPHAGE) 1000 MG tablet   pioglitazone (ACTOS) 45 MG tablet   Well controlled diabetes mellitus (HCC)       Relevant Medications   empagliflozin (JARDIANCE) 25 MG TABS tablet   metFORMIN (GLUCOPHAGE) 1000 MG tablet   pioglitazone (ACTOS) 45 MG tablet  Major depression, single episode, in complete remission (HCC)       Relevant  Medications   sertraline (ZOLOFT) 100 MG tablet   Encounter for immunization       Relevant Orders   Flu Vaccine Trivalent High Dose (Fluad) (Completed)          General Health Maintenance -Administer influenza vaccine today.      Return in about 3 months (around 02/12/2023) for CHRONIC F/U.       Ronnald Ramp, MD  [facility] [phone] (phone) [phone] (fax)  [facility]

## 2022-11-12 NOTE — Assessment & Plan Note (Signed)
Patient is on Synthroid daily. Chronic  Stable  -Continue current regimen.

## 2022-11-12 NOTE — Assessment & Plan Note (Signed)
Recent increase in A1c from 8 to 9, possibly due to dietary indiscretions. Fasting blood glucose this morning was 140. Patient has made dietary adjustments. Chronic, A1c not yet at goal  -Check point of care A1c today. -Continue current regimen of Jardiance 25mg  daily, Actos 45mg  daily, and Metformin 1000mg  twice daily. -refills provided

## 2022-11-12 NOTE — Assessment & Plan Note (Signed)
Controlled BP at goal Continue metoprolol 100mg  daily  No medications changes today

## 2022-11-12 NOTE — Assessment & Plan Note (Addendum)
Stable, no chest pain reported  No current symptoms of chest pain or shortness of breath. Patient is on Aspirin 81mg  daily and Metoprolol 100mg  daily.continue atorvastatin 20mg  every day   Followed by cardiology

## 2022-11-12 NOTE — Assessment & Plan Note (Addendum)
Chronic  Last triglycerides were elevated at 343. Patient is on Atorvastatin 20mg  daily and Zetia 10mg  daily. Lipid panel ordered today

## 2022-11-13 LAB — LIPID PANEL
Chol/HDL Ratio: 3 {ratio} (ref 0.0–5.0)
Cholesterol, Total: 146 mg/dL (ref 100–199)
HDL: 48 mg/dL (ref >39)
LDL Chol Calc (NIH): 59 mg/dL (ref 0–99)
Triglycerides: 243 mg/dL — ABNORMAL HIGH (ref 0–149)
VLDL Cholesterol Cal: 39 mg/dL (ref 5–40)

## 2022-11-14 ENCOUNTER — Ambulatory Visit: Payer: Medicare Other | Admitting: Family Medicine

## 2022-11-18 DIAGNOSIS — E119 Type 2 diabetes mellitus without complications: Secondary | ICD-10-CM | POA: Diagnosis not present

## 2022-11-18 DIAGNOSIS — H2513 Age-related nuclear cataract, bilateral: Secondary | ICD-10-CM | POA: Diagnosis not present

## 2022-11-18 LAB — HM DIABETES EYE EXAM

## 2022-11-21 ENCOUNTER — Ambulatory Visit: Payer: Medicare Other | Admitting: Family Medicine

## 2023-02-02 NOTE — Progress Notes (Signed)
 Cardiology Office Note  Date:  02/04/2023   ID:  [name] [name], DOB Jan 18, 1956, MRN [id number]  PCP:  [name] Coyer, MD   Chief Complaint  Patient presents with   12 month follow up     Patient c/o a lack of energy for the past couple of months.     HPI:  [name] [name] is a 68  y.o. 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 Left ventricular ejection fraction, by estimation, is 55 to 60%. The  left ventricle has normal function.   EKG Interpretation Date/Time:  Tuesday February 04 2023 08:29:41 EST Ventricular Rate:  86 PR Interval:  188 QRS Duration:  78 QT Interval:  366 QTC Calculation: 437 R Axis:   16  Text Interpretation: Sinus rhythm with occasional Premature ventricular complexes When compared with ECG of 23-Apr-2018 14:39, Premature ventricular complexes are now Present Confirmed by Perla Lye [phone]) on 02/04/2023 8:33:31 AM    Other past medical history reviewed  virus in May 2022 Covid 07/2020, had some residual shortness of breath  Angina sx in 01/19/2007, SOB At time of NSTEMI had chest pain  Ziopatch 01/18/18 brief SVT episodes (30 total, longest 14 beats) without associated patient triggered events   PMH:   has a past medical history of  A-fib (HCC), Coronary artery disease, Diabetes mellitus without complication (HCC), Hyperlipidemia, Hypertension, Raynaud's disease, and Thyroid  disease.  PSH:    Past Surgical History:  Procedure Laterality Date   CARDIAC CATHETERIZATION     CARDIAC SURGERY  05/30/2005   stent placement    Current Outpatient Medications  Medication Sig Dispense Refill   aspirin 81 MG tablet Take by mouth.     atorvastatin  (LIPITOR) 20 MG tablet TAKE 1 TABLET BY MOUTH  DAILY AT 6 PM 90 tablet 3   cholecalciferol (VITAMIN D ) 1000 UNITS tablet Take by mouth.     Coenzyme Q10 100 MG TABS Take by mouth.     empagliflozin  (JARDIANCE ) 25 MG TABS tablet Take 1 tablet (25 mg total) by mouth daily. 90 tablet 3   ezetimibe  (ZETIA ) 10 MG tablet Take 1 tablet (10 mg total) by mouth daily. 90 tablet 3   Ferrous Sulfate (IRON PO) Take 325 mg by mouth daily.     fluticasone  (FLONASE ) 50 MCG/ACT nasal spray Place 2 sprays into both nostrils daily. 16 g 6   glucose blood (ONETOUCH ULTRA) test strip CHECK BLOOD SUGAR ONCE  DAILY 100 strip 3   levothyroxine  (SYNTHROID ) 75 MCG tablet Take 1 tablet (75 mcg total) by mouth daily before breakfast. 90 tablet 3   metFORMIN  (GLUCOPHAGE ) 1000 MG tablet TAKE 1 TABLET(1000 MG) BY MOUTH TWICE DAILY WITH A MEAL 180 tablet 3   metoprolol  succinate (TOPROL -XL) 100 MG 24 hr  tablet TAKE 1 TABLET BY MOUTH AS DIRECTED WITH OR IMMEDIATELY FOLLOWING A MEAL 90 tablet 03   nitroGLYCERIN  (NITROSTAT ) 0.4 MG SL tablet Place 1 tablet (0.4 mg total) under the tongue every 5 (five) minutes as needed for chest pain. 30 tablet 1   pioglitazone  (ACTOS ) 45 MG tablet TAKE 1 TABLET(45 MG) BY MOUTH DAILY 90 tablet 3   pyridOXINE (VITAMIN B-6) 100 MG tablet Take 100 mg by mouth daily.     sertraline  (ZOLOFT ) 100 MG tablet Take 1 tablet (100 mg total) by mouth daily. 90 tablet 3   temazepam  (RESTORIL ) 30 MG capsule Take 1 capsule (30 mg total) by mouth at bedtime. 90 capsule 0   No current  facility-administered medications for this visit.    Allergies:   Enalapril    Social History:  The patient  reports that he has never smoked. He has never used smokeless tobacco. He reports current alcohol use. He reports that he does not use drugs.   Family History:   family history includes Diabetes in his mother; Glaucoma in his mother; Gout in his mother; Heart disease in his father and mother; Hypertension in his father, mother, sister, and sister; Prostate cancer in his father; Skin cancer in his mother; Ulcers in his mother.    Review of Systems: Review of Systems  Constitutional: Negative.   HENT: Negative.    Respiratory: Negative.    Cardiovascular: Negative.   Gastrointestinal: Negative.   Musculoskeletal: Negative.   Neurological: Negative.   Psychiatric/Behavioral: Negative.    All other systems reviewed and are negative.    PHYSICAL EXAM: VS:  BP 100/70 (BP Location: Left Arm, Patient Position: Sitting, Cuff Size: Normal)   Pulse 86   Ht 5' 10 (1.778 m)   Wt 184 lb 4 oz (83.6 kg)   SpO2 96%   BMI 26.44 kg/m  , BMI Body mass index is 26.44 kg/m. Constitutional:  oriented to person, place, and time. No distress.  HENT:  Head: Grossly normal Eyes:  no discharge. No scleral icterus.  Neck: No JVD, no carotid bruits  Cardiovascular: Regular rate and rhythm, no murmurs appreciated Pulmonary/Chest: Clear to auscultation bilaterally, no wheezes or rails Abdominal: Soft.  no distension.  no tenderness.  Musculoskeletal: Normal range of motion Neurological:  normal muscle tone. Coordination normal. No atrophy Skin: Skin warm and dry Psychiatric: normal affect, pleasant   Recent Labs: 05/14/2022: ALT 33; BUN 13; Creatinine, Ser 1.04; Potassium 5.0; Sodium 139; TSH 4.370    Lipid Panel Lab Results  Component Value Date   CHOL 146 11/12/2022   HDL 48 11/12/2022   LDLCALC 59 11/12/2022   TRIG 243 (H) 11/12/2022        ASSESSMENT AND PLAN:  Problem List  Items Addressed This Visit       Cardiology Problems   Coronary artery disease of native artery of native heart with stable angina pectoris (HCC) - Primary   Relevant Orders   EKG 12-Lead (Completed)   Essential (primary) hypertension   Relevant Orders   EKG 12-Lead (Completed)   HLD (hyperlipidemia)   Other Visit Diagnoses       Diabetes mellitus type 2 with complications (HCC)         Shortness of breath       Relevant Orders   EKG 12-Lead (Completed)      Coronary disease with stable angina Currently with no symptoms of angina. No further workup at this time. Continue current medication regimen.  Hyperlipidemia Cholesterol is  at goal on the current lipid regimen. No changes to the medications were made.  Diabetes type 2 with complications Managed by primary care Diet discussed with him, recommended he cut back on his sandwiches Goal A1c in the 6 range   Signed, Velinda Lunger, M.D., Ph.D. [facility] [place], [place] [phone]

## 2023-02-04 ENCOUNTER — Encounter: Payer: Self-pay | Admitting: Cardiovascular Disease

## 2023-02-04 ENCOUNTER — Ambulatory Visit: Payer: Medicare Other | Attending: Cardiovascular Disease | Admitting: Cardiovascular Disease

## 2023-02-04 VITALS — BP 100/70 | HR 86 | Ht 70.0 in | Wt 184.2 lb

## 2023-02-04 DIAGNOSIS — R0602 Shortness of breath: Secondary | ICD-10-CM

## 2023-02-04 DIAGNOSIS — E118 Type 2 diabetes mellitus with unspecified complications: Secondary | ICD-10-CM | POA: Diagnosis not present

## 2023-02-04 DIAGNOSIS — I10 Essential (primary) hypertension: Secondary | ICD-10-CM

## 2023-02-04 DIAGNOSIS — E782 Mixed hyperlipidemia: Secondary | ICD-10-CM | POA: Diagnosis not present

## 2023-02-04 DIAGNOSIS — I25118 Atherosclerotic heart disease of native coronary artery with other forms of angina pectoris: Secondary | ICD-10-CM

## 2023-02-04 NOTE — Patient Instructions (Signed)

## 2023-02-12 ENCOUNTER — Ambulatory Visit (INDEPENDENT_AMBULATORY_CARE_PROVIDER_SITE_OTHER): Payer: Medicare Other | Admitting: Family Medicine

## 2023-02-12 ENCOUNTER — Encounter: Payer: Self-pay | Admitting: Family Medicine

## 2023-02-12 VITALS — BP 119/85 | HR 85 | Ht 70.0 in | Wt 182.9 lb

## 2023-02-12 DIAGNOSIS — I10 Essential (primary) hypertension: Secondary | ICD-10-CM | POA: Diagnosis not present

## 2023-02-12 DIAGNOSIS — F320 Major depressive disorder, single episode, mild: Secondary | ICD-10-CM | POA: Diagnosis not present

## 2023-02-12 DIAGNOSIS — E1165 Type 2 diabetes mellitus with hyperglycemia: Secondary | ICD-10-CM

## 2023-02-12 DIAGNOSIS — E559 Vitamin D deficiency, unspecified: Secondary | ICD-10-CM

## 2023-02-12 DIAGNOSIS — R5383 Other fatigue: Secondary | ICD-10-CM | POA: Diagnosis not present

## 2023-02-12 DIAGNOSIS — E782 Mixed hyperlipidemia: Secondary | ICD-10-CM

## 2023-02-12 DIAGNOSIS — G4709 Other insomnia: Secondary | ICD-10-CM

## 2023-02-12 DIAGNOSIS — D649 Anemia, unspecified: Secondary | ICD-10-CM

## 2023-02-12 DIAGNOSIS — E039 Hypothyroidism, unspecified: Secondary | ICD-10-CM | POA: Diagnosis not present

## 2023-02-12 DIAGNOSIS — I25118 Atherosclerotic heart disease of native coronary artery with other forms of angina pectoris: Secondary | ICD-10-CM | POA: Diagnosis not present

## 2023-02-12 DIAGNOSIS — E785 Hyperlipidemia, unspecified: Secondary | ICD-10-CM

## 2023-02-12 DIAGNOSIS — Z7984 Long term (current) use of oral hypoglycemic drugs: Secondary | ICD-10-CM

## 2023-02-12 MED ORDER — TEMAZEPAM 30 MG PO CAPS: 30.0000 mg | ORAL_CAPSULE | Freq: Every day | ORAL | 0 refills | Status: AC

## 2023-02-12 MED ORDER — NITROGLYCERIN 0.4 MG SL SUBL: 0.4000 mg | SUBLINGUAL_TABLET | SUBLINGUAL | 1 refills | Status: AC | PRN

## 2023-02-12 MED ORDER — EZETIMIBE 10 MG PO TABS: 10.0000 mg | ORAL_TABLET | Freq: Every day | ORAL | 3 refills | Status: AC

## 2023-02-12 NOTE — Assessment & Plan Note (Signed)
 Chronic condition. Managed with Zoloft . - Continue Zoloft  100 mg daily

## 2023-02-12 NOTE — Progress Notes (Signed)
 Established patient visit   Patient: [name] [name]   DOB: April 12, 1955   68 y.o. Male  MRN: [id number] Visit Date: 02/12/2023  Today's healthcare provider: Mimi Alt, MD   Chief Complaint  Patient presents with   Medical Management of Chronic Issues   Diabetes   Hyperlipidemia   Hypothyroidism    Symptoms: fatigue    Hypertension   Anxiety   Subjective     HPI     Diabetes   Weight loss: Present.  Home glucose fasting ranges: 90-160/70.  Eye exam is current.  Patient does not see a podiatrist.  The patient exercises daily.        Hypothyroidism    Additional comments: Symptoms: fatigue       Last edited by Pasty Bongo, CMA on 02/12/2023  8:11 AM.       Discussed the use of AI scribe software for clinical note transcription with the patient, who gave verbal consent to proceed.  History of Present Illness   The patient is a 68 year old individual with a complex medical history including Raynaud's syndrome, major depressive disorder, insomnia, hyperlipidemia, hypertension, diabetes with hyperglycemia, coronary artery disease with stable angina, vitamin D  deficiency, adult hypothyroidism, and a history of myocardial infarction. The patient's coronary artery disease is managed with daily aspirin, and his hyperlipidemia is treated with Lipitor and coenzyme Q10. The patient's vitamin D  deficiency is supplemented with daily vitamin D , and his diabetes is managed with Jardiance , metformin , and Actos .  The patient also takes Zetia  for hyperlipidemia and ferrous sulfate for a history of anemia. For allergic rhinitis, the patient uses Flonase . The patient's hypothyroidism is managed with Synthroid . For hypertension, the patient takes atenolol, and for angina, he has nitroglycerin  as needed. The patient's depression is managed with Zoloft , and his insomnia with Restoril .  The patient's last complete blood count showed a hemoglobin of 13, and his last  metabolic panel was within normal limits. However, his last lipid count showed elevated triglycerides and a hemoglobin A1c of 8.4, indicating that his diabetes is not yet at the goal of less than 7. The patient's last vitamin D  level was within normal range, and his last thyroid  studies showed a normal TSH. The patient continues to take Synthroid  and ferrous sulfate for normocytic anemia.       Blood glucose has been averaging from 110-160, reports he has been adhering to medication regimen   Pt would like to have thyroid  levels checked as well as testosterone  levels due to fatigue   Reports that he recently saw his cardiologist, Dr. Gollan and is happy to report that his next appt will be in a year, denies having chest pain     Past Medical History:  Diagnosis Date   A-fib [facility])    Coronary artery disease    Diabetes mellitus without complication (HCC)    Hyperlipidemia    Hypertension    Raynaud's disease    Thyroid  disease     Medications: Outpatient Medications Prior to Visit  Medication Sig   aspirin 81 MG tablet Take by mouth.   atorvastatin  (LIPITOR) 20 MG tablet TAKE 1 TABLET BY MOUTH  DAILY AT 6 PM   cholecalciferol (VITAMIN D ) 1000 UNITS tablet Take by mouth.   Coenzyme Q10 100 MG TABS Take by mouth.   empagliflozin  (JARDIANCE ) 25 MG TABS tablet Take 1 tablet (25 mg total) by mouth daily.   Ferrous Sulfate (IRON PO) Take 325 mg by mouth daily.  fluticasone  (FLONASE ) 50 MCG/ACT nasal spray Place 2 sprays into both nostrils daily.   glucose blood (ONETOUCH ULTRA) test strip CHECK BLOOD SUGAR ONCE  DAILY   levothyroxine  (SYNTHROID ) 75 MCG tablet Take 1 tablet (75 mcg total) by mouth daily before breakfast.   metFORMIN  (GLUCOPHAGE ) 1000 MG tablet TAKE 1 TABLET(1000 MG) BY MOUTH TWICE DAILY WITH A MEAL   metoprolol  succinate (TOPROL -XL) 100 MG 24 hr tablet TAKE 1 TABLET BY MOUTH AS DIRECTED WITH OR IMMEDIATELY FOLLOWING A MEAL   pioglitazone  (ACTOS ) 45 MG tablet TAKE 1  TABLET(45 MG) BY MOUTH DAILY   pyridOXINE (VITAMIN B-6) 100 MG tablet Take 100 mg by mouth daily.   sertraline  (ZOLOFT ) 100 MG tablet Take 1 tablet (100 mg total) by mouth daily.   [DISCONTINUED] ezetimibe  (ZETIA ) 10 MG tablet Take 1 tablet (10 mg total) by mouth daily.   [DISCONTINUED] nitroGLYCERIN  (NITROSTAT ) 0.4 MG SL tablet Place 1 tablet (0.4 mg total) under the tongue every 5 (five) minutes as needed for chest pain.   [DISCONTINUED] temazepam  (RESTORIL ) 30 MG capsule Take 1 capsule (30 mg total) by mouth at bedtime.   No facility-administered medications prior to visit.    Review of Systems  Last CBC Lab Results  Component Value Date   WBC 8.4 05/31/2021   HGB 13.4 05/31/2021   HCT 43.0 05/31/2021   MCV 79 05/31/2021   MCH 24.7 (L) 05/31/2021   RDW 17.9 (H) 05/31/2021   PLT 267 05/31/2021   Last metabolic panel Lab Results  Component Value Date   GLUCOSE 166 (H) 05/14/2022   NA 139 05/14/2022   K 5.0 05/14/2022   CL 103 05/14/2022   CO2 22 05/14/2022   BUN 13 05/14/2022   CREATININE 1.04 05/14/2022   EGFR 79 05/14/2022   CALCIUM  10.2 05/14/2022   PHOS 3.3 10/02/2017   PROT 7.1 05/14/2022   ALBUMIN 4.6 05/14/2022   LABGLOB 2.5 05/14/2022   AGRATIO 1.8 05/14/2022   BILITOT 0.4 05/14/2022   ALKPHOS 102 05/14/2022   AST 30 05/14/2022   ALT 33 05/14/2022   ANIONGAP 10 04/23/2018   Last lipids Lab Results  Component Value Date   CHOL 146 11/12/2022   HDL 48 11/12/2022   LDLCALC 59 11/12/2022   TRIG 243 (H) 11/12/2022   CHOLHDL 3.0 11/12/2022   Last hemoglobin A1c Lab Results  Component Value Date   HGBA1C 8.4 (A) 11/12/2022   Last thyroid  functions Lab Results  Component Value Date   TSH 4.370 05/14/2022     Last vitamin D  Lab Results  Component Value Date   VD25OH 48.6 05/14/2022       Objective    BP 119/85 (BP Location: Right Arm, Patient Position: Sitting, Cuff Size: Normal)   Pulse 85   Ht 5\' 10"  (1.778 m)   Wt 182 lb 14.4 oz (83  kg)   SpO2 100%   BMI 26.24 kg/m   BP Readings from Last 3 Encounters:  02/12/23 119/85  02/04/23 100/70  11/12/22 114/86   Wt Readings from Last 3 Encounters:  02/12/23 182 lb 14.4 oz (83 kg)  02/04/23 184 lb 4 oz (83.6 kg)  11/12/22 186 lb 6.4 oz (84.6 kg)        Physical Exam Vitals reviewed.  Constitutional:      General: He is not in acute distress.    Appearance: Normal appearance. He is not ill-appearing, toxic-appearing or diaphoretic.  Eyes:     Conjunctiva/sclera: Conjunctivae normal.  Cardiovascular:  Rate and Rhythm: Normal rate and regular rhythm.     Pulses: Normal pulses.     Heart sounds: Normal heart sounds. No murmur heard.    No friction rub. No gallop.  Pulmonary:     Effort: Pulmonary effort is normal. No respiratory distress.     Breath sounds: Normal breath sounds. No stridor. No wheezing, rhonchi or rales.  Abdominal:     General: Bowel sounds are normal. There is no distension.     Palpations: Abdomen is soft.     Tenderness: There is no abdominal tenderness.  Musculoskeletal:     Right lower leg: No edema.     Left lower leg: No edema.  Skin:    Findings: No erythema or rash.  Neurological:     Mental Status: He is alert and oriented to person, place, and time.       No results found for any visits on 02/12/23.  Assessment & Plan     Problem List Items Addressed This Visit       Cardiovascular and Mediastinum   Essential (primary) hypertension - Primary   Chronic condition. Managed with metoprolol . - Continue metoprolol  100 mg daily      Relevant Medications   nitroGLYCERIN  (NITROSTAT ) 0.4 MG SL tablet   ezetimibe  (ZETIA ) 10 MG tablet   Coronary artery disease of native artery of native heart with stable angina pectoris (HCC)   Chronic condition. History of myocardial infarction. Managed with aspirin and nitroglycerin  as needed. - Continue aspirin 81 mg daily - Continue nitroglycerin  0.4 mg sublingual as needed       Relevant Medications   nitroGLYCERIN  (NITROSTAT ) 0.4 MG SL tablet   ezetimibe  (ZETIA ) 10 MG tablet     Endocrine   Diabetes mellitus with hyperglycemia (HCC)   Chronic condition. Last hemoglobin A1c in October was 8.4, not at goal of less than 7. Managed with Jardiance , metformin , and Actos . - Continue Jardiance  25 mg daily - Continue metformin  1000 mg twice daily - Continue Actos  45 mg daily - Repeat hemoglobin A1c today      Relevant Orders   Hemoglobin A1c   Urine microalbumin-creatinine with uACR   Adult hypothyroidism   Chronic condition. Last thyroid  studies showed normal TSH of 4.370. Managed with Synthroid . -recheck TSH,T4 and T3 today due to report of increased fatigue - Continue Synthroid  75 mcg daily      Relevant Orders   TSH+T4F+T3Free     Other   Normocytic anemia   Chronic condition. Last CBC in 2023 showed hemoglobin of 13. Managed with ferrous sulfate. - Continue ferrous sulfate 325 mg      Major depressive disorder, single episode, mild (HCC)   Chronic condition. Managed with Zoloft . - Continue Zoloft  100 mg daily      Insomnia   Chronic condition. Managed with Restoril . - Continue Restoril  30 mg at bedtime      Relevant Medications   temazepam  (RESTORIL ) 30 MG capsule   HLD (hyperlipidemia)   Chronic condition. Last lipid count in October 2024 showed LDL of 59 and elevated triglycerides at 243. Managed with Lipitor, Zetia , and coenzyme Q10 for possible statin-associated myopathy. - Continue Lipitor 20 mg daily - Continue Zetia  10 mg daily - Continue coenzyme Q10 100 mg daily      Relevant Medications   nitroGLYCERIN  (NITROSTAT ) 0.4 MG SL tablet   ezetimibe  (ZETIA ) 10 MG tablet   Avitaminosis D   Chronic condition. Last vitamin D  level was 48.6 in April 2024. Managed with  vitamin D  supplementation. - Continue vitamin D  1000 units daily      Other Visit Diagnoses       Other fatigue       Relevant Orders   TSH+T4F+T3Free    Testosterone ,Free and Total       Fatigue  Patient reports increasing fatigue and would like to have both thyroid  levels and testosterone  levels checked today  -TSH,T4 and T3 ordered  -testosterone  levels ordered   Allergic Rhinitis Chronic condition. Managed with Flonase . - Continue Flonase  50 mcg, two sprays into both nostrils once daily.         Return in about 6 months (around 08/12/2023) for CHRONIC F/U.         Mimi Alt, MD  [facility] [phone] (phone) [phone] (fax)  [facility]

## 2023-02-12 NOTE — Assessment & Plan Note (Signed)
 Chronic condition. Last hemoglobin A1c in October was 8.4, not at goal of less than 7. Managed with Jardiance , metformin , and Actos . - Continue Jardiance  25 mg daily - Continue metformin  1000 mg twice daily - Continue Actos  45 mg daily - Repeat hemoglobin A1c today

## 2023-02-12 NOTE — Patient Instructions (Signed)
 It was a pleasure to see you today!  Thank you for choosing [facility] for your primary care.     To keep you healthy, please keep in mind the following health maintenance items that you are due for:   Shingrix  Best Wishes,   Dr. Verdia Glad

## 2023-02-12 NOTE — Assessment & Plan Note (Signed)
 Chronic condition. Managed with Restoril . - Continue Restoril  30 mg at bedtime

## 2023-02-12 NOTE — Assessment & Plan Note (Signed)
 Chronic condition. Last CBC in 2023 showed hemoglobin of 13. Managed with ferrous sulfate. - Continue ferrous sulfate 325 mg

## 2023-02-12 NOTE — Assessment & Plan Note (Signed)
 Chronic condition. Managed with metoprolol . - Continue metoprolol  100 mg daily

## 2023-02-12 NOTE — Assessment & Plan Note (Signed)
 Chronic condition. History of myocardial infarction. Managed with aspirin and nitroglycerin  as needed. - Continue aspirin 81 mg daily - Continue nitroglycerin  0.4 mg sublingual as needed

## 2023-02-12 NOTE — Assessment & Plan Note (Signed)
 Chronic condition. Last thyroid  studies showed normal TSH of 4.370. Managed with Synthroid . -recheck TSH,T4 and T3 today due to report of increased fatigue - Continue Synthroid  75 mcg daily

## 2023-02-12 NOTE — Assessment & Plan Note (Signed)
 Chronic condition. Last lipid count in October 2024 showed LDL of 59 and elevated triglycerides at 243. Managed with Lipitor, Zetia , and coenzyme Q10 for possible statin-associated myopathy. - Continue Lipitor 20 mg daily - Continue Zetia  10 mg daily - Continue coenzyme Q10 100 mg daily

## 2023-02-12 NOTE — Assessment & Plan Note (Signed)
 Chronic condition. Last vitamin D  level was 48.6 in April 2024. Managed with vitamin D  supplementation. - Continue vitamin D  1000 units daily

## 2023-02-15 LAB — TSH+T4F+T3FREE
Free T4: 1.34 ng/dL (ref 0.82–1.77)
T3, Free: 2.5 pg/mL (ref 2.0–4.4)
TSH: 4.39 u[IU]/mL (ref 0.450–4.500)

## 2023-02-15 LAB — TESTOSTERONE,FREE AND TOTAL
Testosterone, Free: 9.5 pg/mL (ref 6.6–18.1)
Testosterone: 343 ng/dL (ref 264–916)

## 2023-02-15 LAB — HEMOGLOBIN A1C
Est. average glucose Bld gHb Est-mCnc: 203 mg/dL
Hgb A1c MFr Bld: 8.7 % — ABNORMAL HIGH (ref 4.8–5.6)

## 2023-02-15 LAB — MICROALBUMIN / CREATININE URINE RATIO
Creatinine, Urine: 88.4 mg/dL
Microalb/Creat Ratio: 5 mg/g{creat} (ref 0–29)
Microalbumin, Urine: 4.2 ug/mL

## 2023-03-30 ENCOUNTER — Other Ambulatory Visit: Payer: Self-pay | Admitting: Family Medicine

## 2023-03-30 DIAGNOSIS — I10 Essential (primary) hypertension: Secondary | ICD-10-CM

## 2023-04-01 NOTE — Telephone Encounter (Signed)
 Requested Prescriptions  Pending Prescriptions Disp Refills   metoprolol succinate (TOPROL-XL) 100 MG 24 hr tablet [Pharmacy Med Name: METOPROLOL ER SUCCINATE 100MG  TABS] 90 tablet 1    Sig: TAKE 1 TABLET BY MOUTH AS DIRECTED WITH OR FOLLOWING MEAL     Cardiovascular:  Beta Blockers Passed - 04/01/2023  8:11 AM      Passed - Last BP in normal range    BP Readings from Last 1 Encounters:  02/12/23 119/85         Passed - Last Heart Rate in normal range    Pulse Readings from Last 1 Encounters:  02/12/23 85         Passed - Valid encounter within last 6 months    Recent Outpatient Visits           1 month ago Essential (primary) hypertension   [facility] Simmons-Robinson, [place], MD   4 months ago Type 2 diabetes mellitus with hyperglycemia, without long-term current use of insulin (HCC)   [facility] Simmons-Robinson, [place], MD   7 months ago Type 2 diabetes mellitus with hyperglycemia, without long-term current use of insulin (HCC)   [facility] Simmons-Robinson, [place], MD   10 months ago Type 2 diabetes mellitus with hyperglycemia, without long-term current use of insulin (HCC)    [facility] Simmons-Robinson, [place], MD   1 year ago Coronary artery disease of native artery of native heart with stable angina pectoris (HCC)    [facility] Simmons-Robinson, Tawanna Cooler, MD       Future Appointments             In 4 months Simmons-Robinson, Tawanna Cooler, MD [facility]

## 2023-05-09 DIAGNOSIS — I4891 Unspecified atrial fibrillation: Secondary | ICD-10-CM | POA: Diagnosis not present

## 2023-05-09 DIAGNOSIS — E119 Type 2 diabetes mellitus without complications: Secondary | ICD-10-CM | POA: Diagnosis not present

## 2023-05-30 ENCOUNTER — Other Ambulatory Visit: Payer: Self-pay | Admitting: Family Medicine

## 2023-05-30 DIAGNOSIS — E039 Hypothyroidism, unspecified: Secondary | ICD-10-CM

## 2023-06-02 NOTE — Telephone Encounter (Signed)
 Requested Prescriptions  Pending Prescriptions Disp Refills   levothyroxine  (SYNTHROID ) 75 MCG tablet [Pharmacy Med Name: L-THYROXINE TABS 75MCG] 90 tablet 1    Sig: TAKE 1 TABLET DAILY BEFORE BREAKFAST     Endocrinology:  Hypothyroid Agents Failed - 06/02/2023  2:13 PM      Failed - Valid encounter within last 12 months    Recent Outpatient Visits   None     Future Appointments             In 2 months Simmons-Robinson, [place], MD  [facility]            Passed - TSH in normal range and within 360 days    TSH  Date Value Ref Range Status  02/12/2023 4.390 0.450 - 4.500 uIU/mL Final

## 2023-06-05 ENCOUNTER — Telehealth: Payer: Self-pay

## 2023-06-05 NOTE — Telephone Encounter (Signed)
 Copied from CRM [id number]. Topic: Clinical - Medication Question >> Jun 05, 2023  3:31 PM Leory Rands wrote: Reason for CRM: [name] [name] calling from [facility] is calling with question regarding atorvastatin  (LIPITOR) 20 MG tablet [[id number]]. Is this medication still after or has the provider d/c? Since the medication has not picked up 08/2022. If the mediation has been d/c what is the reason? [phone] Me

## 2023-06-06 NOTE — Telephone Encounter (Signed)
 Patient has established care with Dr. Oletta Berry, no longer under prescriber's care

## 2023-06-11 DIAGNOSIS — M1811 Unilateral primary osteoarthritis of first carpometacarpal joint, right hand: Secondary | ICD-10-CM | POA: Diagnosis not present

## 2023-06-11 DIAGNOSIS — E1165 Type 2 diabetes mellitus with hyperglycemia: Secondary | ICD-10-CM | POA: Diagnosis not present

## 2023-06-11 DIAGNOSIS — Z125 Encounter for screening for malignant neoplasm of prostate: Secondary | ICD-10-CM | POA: Diagnosis not present

## 2023-06-11 DIAGNOSIS — Z0000 Encounter for general adult medical examination without abnormal findings: Secondary | ICD-10-CM | POA: Diagnosis not present

## 2023-06-16 DIAGNOSIS — M1811 Unilateral primary osteoarthritis of first carpometacarpal joint, right hand: Secondary | ICD-10-CM | POA: Diagnosis not present

## 2023-07-25 ENCOUNTER — Telehealth: Payer: Self-pay

## 2023-07-25 NOTE — Telephone Encounter (Signed)
 Patient is no longer being seen in our practice and caller has been advised    Copied from CRM [id number]. Topic: Medical Record Request - Other >> Jul 25, 2023  9:27 AM Powell HERO wrote: Reason for CRM: [facility] calling to get information on this patient. He is flagged as having a heart condition but not being on a statin. Please call to follow in regard to this patient when available. She said she sees where he was on atorvastatin  and it looks like it was last filled on August of last year. [phone] ext [phone]. Kayla.

## 2023-08-12 ENCOUNTER — Ambulatory Visit: Payer: Self-pay | Admitting: Family Medicine

## 2023-10-16 ENCOUNTER — Other Ambulatory Visit: Payer: Self-pay | Admitting: Family Medicine

## 2023-10-16 DIAGNOSIS — E785 Hyperlipidemia, unspecified: Secondary | ICD-10-CM

## 2024-02-13 NOTE — Progress Notes (Signed)
 [facility] - [facility]  Chief complaint: Follow-up   Subjective: [name] [name] is a 69 y.o. male here for recheck. History of Present Illness [name] [name] is a 69 year old male with a history of heart issues who presents with neck pain.  He has been experiencing neck pain for the past three weeks, describing it as a 'crick in the neck' that began without any known injury. The pain is localized to the neck and does not radiate down the arm. It is exacerbated by turning his head to the side and feels like a stretching sensation. He has been using Advil and Aleve for relief, noting that Aleve seems to last longer.  He has a history of heart issues, including a past heart attack, but no current chest pain or significant shortness of breath. He recalls an episode where he lost consciousness and required CPR from his wife, which was related to his heart condition. During his heart attack, he did not experience chest pain, only feeling unwell with low blood pressure.  He has had COVID-19 three times, with the last episode occurring three years ago. 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 Despite these symptoms, he remains active, working on his farm and cutting firewood.  He mentions a family history of heart issues, noting that his younger brother recently underwent a double bypass surgery. His brother's condition was deemed too risky for stent placement, necessitating the surgery.    Current Outpatient Medications:    atorvastatin  (LIPITOR) 20 MG tablet, Take 1 tablet (20 mg total) by mouth once daily, Disp: 90 tablet, Rfl: 3   blood glucose diagnostic (ONETOUCH ULTRA TEST) test strip, CHECK BLOOD SUGAR ONCE  DAILY, Disp: , Rfl:    diclofenac (VOLTAREN) 1 % topical gel, Apply 2 g topically 4 (four) times daily For hand/thumb pain., Disp: 100 g, Rfl: 11   empagliflozin  (JARDIANCE ) 25 mg tablet, Take 1 tablet (25 mg total) by  mouth once daily, Disp: 90 tablet, Rfl: 3   ezetimibe  (ZETIA ) 10 mg tablet, Take 1 tablet (10 mg total) by mouth once daily, Disp: 90 tablet, Rfl: 3   fluticasone  propionate (FLONASE ) 50 mcg/actuation nasal spray, Place 2 sprays into one nostril once daily, Disp: , Rfl:    iron ps complex/B12/folic acid (IRON POLYSACCH COMPLEX-B12-FA ORAL), Take 325 mg by mouth once daily, Disp: , Rfl:    lansoprazole (PREVACID) 15 MG DR capsule, Take 15 mg by mouth once daily, Disp: , Rfl:    levothyroxine  (SYNTHROID ) 75 MCG tablet, Take 1 tablet (75 mcg total) by mouth once daily, Disp: 90 tablet, Rfl: 3   metFORMIN  (GLUCOPHAGE ) 1000 MG tablet, Take 1 tablet (1,000 mg total) by mouth 2 (two) times daily with meals, Disp: 180 tablet, Rfl: 3   metoprolol  SUCCinate (TOPROL -XL) 100 MG XL tablet, Take 1 tablet (100 mg total) by mouth once daily, Disp: 90 tablet, Rfl: 3   multivitamin tablet, Take 1 tablet by mouth once daily, Disp: , Rfl:    nitroGLYcerin  (NITROSTAT ) 0.4 MG SL tablet, Place 0.4 mg under the tongue every 5 (five) minutes as needed, Disp: , Rfl:    ONETOUCH ULTRA TEST test strip, 1 strip once daily, Disp: , Rfl:    pioglitazone  (ACTOS ) 45 MG tablet, Take 1 tablet (45 mg total) by mouth once daily, Disp: 90 tablet, Rfl: 3   pyridoxine, vitamin B6, (B-6) 100 MG tablet, Take 100 mg by mouth once daily, Disp: ,  Rfl:    sertraline  (ZOLOFT ) 100 MG tablet, Take 1 tablet (100 mg total) by mouth once daily, Disp: 90 tablet, Rfl: 3   tirzepatide (MOUNJARO) 2.5 mg/0.5 mL pen injector, Inject 0.5 mLs (2.5 mg total) subcutaneously once a week, Disp: 6 mL, Rfl: 3  ROS reviewed: General ROS:  Negative for  - fatigue, fevers, chills HEENT ROS: negative for seasonal allergies, congestion, cough Respiratory ROS: negative for - cough, SOB, wheezing Cardiovascular ROS: no chest pain or dyspnea on exertion Gastrointestinal ROS: negative for constipation, N/V,D Genito-Urinary ROS: no dysuria, trouble  voiding, or hematuria Musculoskeletal ROS: negative for joint pain, swelling Neurological ROS: negative for headaches, dizzinesss Dermatological ROS: negative for rash or skin lesion    Psychological ROS: negative for depression or anxiety  Allergies  Allergen Reactions   Enalapril  Swelling    Lip swelling    Social History   Tobacco Use   Smoking status: Never   Smokeless tobacco: Never  Substance Use Topics   Alcohol use: Yes    Alcohol/week: 2.0 standard drinks of alcohol    Types: 1 Glasses of wine, 1 Cans of beer per week   Drug use: Never      Objective:  BP 118/84 (BP Location: Left upper arm, Patient Position: Sitting, BP Cuff Size: Adult)   Pulse 75   Resp 16   Ht 177.8 cm (5' 10)   Wt 84.8 kg (187 lb)   SpO2 94%   BMI 26.83 kg/m  reviewed. Gen: AAOx3. Well-developed and well-nourished. NAD.  HEENT:    HEAD NORMOCEPHALIC.  PERRLA, EOM intact.  No thyromegaly present. No lymphadpathy. Cardiovascular: Normal rate, regular rhythm. Normal S1 and S2 without murmus, rubs or gallops.  Pulmonary/Chest: CTAB. Effort normal and breath sounds normal. No respiratory distress. No wheezes or rales. Abdomen: Soft. Bowel sounds are normal. No distension or tenderness.  Neuro: Cranial nervess II-XII intact.Nonfocal exam. Skin: Skin is warm and dry.  Musculoskeletal: Normal range of motion. No edema, no tenderness.  Psychiatric: Normal mood and affect. Her behavior is normal. Judgment and thought content normal  Assessment and Plan: Assessment & Plan Cervical paraspinal muscle spasm Chronic cervical paraspinal muscle spasm for three weeks, likely due to muscle spasms. Pain exacerbated by certain movements, no radiation to the arm. Ibuprofen provides some relief, but Aleve is preferred for its longer duration and safety profile with cardiac conditions. - Prescribed naproxen at a prescription dose, two and a half times stronger than over-the-counter, to be sent to [facility]  and [facility]. - Advised use of heating pad for symptomatic relief. - Continue use of Aleve as it provides longer relief and is safer with cardiac conditions.   Type 2 diabetes mellitus with hyperglycemia, without long-term current use of insulin (CMS/HHS-HCC)  (primary encounter diagnosis) Plan: Microalbumin/Creatinine Ratio, Random Urine,        Hemoglobin A1C, tirzepatide (MOUNJARO) 2.5        mg/0.5 mL pen injector  Cervical paraspinal muscle spasm  Coronary artery disease involving native coronary artery of native heart without angina pectoris  Pure hypercholesterolemia  Primary insomnia  Essential (primary) hypertension  Continue Mounjaro.    This note has been created using automated tools and reviewed for accuracy by [facility].

## 2024-02-20 ENCOUNTER — Encounter: Payer: Self-pay | Admitting: Cardiology

## 2024-02-20 ENCOUNTER — Ambulatory Visit: Attending: Cardiology | Admitting: Cardiology

## 2024-02-20 VITALS — BP 118/82 | HR 96 | Ht 70.0 in | Wt 187.0 lb

## 2024-02-20 DIAGNOSIS — I10 Essential (primary) hypertension: Secondary | ICD-10-CM

## 2024-02-20 DIAGNOSIS — E782 Mixed hyperlipidemia: Secondary | ICD-10-CM | POA: Diagnosis not present

## 2024-02-20 DIAGNOSIS — I25118 Atherosclerotic heart disease of native coronary artery with other forms of angina pectoris: Secondary | ICD-10-CM | POA: Diagnosis not present

## 2024-02-20 DIAGNOSIS — E118 Type 2 diabetes mellitus with unspecified complications: Secondary | ICD-10-CM | POA: Diagnosis not present

## 2024-02-20 NOTE — Patient Instructions (Signed)
 Medication Instructions:  Your physician recommends that you continue on your current medications as directed. Please refer to the Current Medication list given to you today.   *If you need a refill on your cardiac medications before your next appointment, please call your pharmacy*  Lab Work: No labs ordered today  If you have labs (blood work) drawn today and your tests are completely normal, you will receive your results only by: MyChart Message (if you have MyChart) OR A paper copy in the mail If you have any lab test that is abnormal or we need to change your treatment, we will call you to review the results.  Testing/Procedures: No test ordered today   Follow-Up: At [facility], you and your health needs are our priority.  As part of our continuing mission to provide you with exceptional heart care, our providers are all part of one team.  This team includes your primary Cardiologist (physician) and Advanced Practice Providers or APPs (Physician Assistants and Nurse Practitioners) who all work together to provide you with the care you need, when you need it.  Your next appointment:   12 month(s)  Provider:   You may see Timothy Gollan, MD or one of the following Advanced Practice Providers on your designated Care Team:   Tylene Lunch, NP   We recommend signing up for the patient portal called MyChart.  Sign up information is provided on this After Visit Summary.  MyChart is used to connect with patients for Virtual Visits (Telemedicine).  Patients are able to view lab/test results, encounter notes, upcoming appointments, etc.  Non-urgent messages can be sent to your provider as well.   To learn more about what you can do with MyChart, go to [email or web address].

## 2024-02-20 NOTE — Progress Notes (Signed)
 " Cardiology Office Note   Date:  02/20/2024  ID:  Clare, Casto [birth year], MRN [id number] PCP: Bertrum Charlie CROME, MD  [facility] Providers Cardiologist:  Evalene Lunger, MD Cardiology APP:  Gerard Frederick, NP     History of Present Illness [name] [name] is a 69 y.o. male with a past medical history of coronary artery disease status post NSTEMI (2008) with PCI and BMS to the right posterior lateral branch, PCI to the RPL, hyperlipidemia, primary hypertension, type 2 diabetes, tachycardia, who presents today for follow-up.  Patient suffered an NSTEMI in 2008 and underwent cardiac catheterization which led to PCI with bare-metal stent to the right posterolateral branch and PCI to the RPL.  He had a ZIO XT monitor in 2019 that revealed brief NSVT episodes without associated patient triggered events.  Echocardiogram completed in 2022 revealed an LVEF 55 to 60% with no valvular abnormalities.  He was last seen in clinic 02/04/2023 by Dr. Gollan.  At that time he was under a little bit of stress as his mother-in-law passed away in 02-10-2023.  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  Denied any symptoms or recent hospitalizations.  There were no medication changes that were made and no further testing that was ordered at that time.  He returns to clinic today stating that he has been doing well from a cardiac perspective.  Denies any chest pain, palpitations, peripheral edema, worsening shortness of breath.  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  States that he has been compliant with his current medication regimen without any undue side effects.  Was recently started on Mounjaro.  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  Denies any hospitalizations or visits to the  emergency department.  ROS: 10 point review of systems has been reviewed and considered negative except what is listed in the HPI  Studies Reviewed EKG Interpretation Date/Time:  Friday February 20 2024 13:38:55 EST Ventricular Rate:  96 PR Interval:  184 QRS Duration:  78 QT Interval:  340 QTC Calculation: 429 R Axis:   18  Text Interpretation: Normal sinus rhythm Normal ECG When compared with ECG of 04-Feb-2023 08:29, No significant change was found Confirmed by Gerard Frederick ([id number]) on 02/20/2024 1:40:53 PM    Cardiac Studies & Procedures   ______________________________________________________________________________________________     ECHOCARDIOGRAM  ECHOCARDIOGRAM COMPLETE 10/03/2020  Narrative ECHOCARDIOGRAM REPORT    Patient Name:   [name] [name] Date of Exam: 10/03/2020 Medical Rec #:  [id number]        Height:       71.0 in Accession #:    [id number]       Weight:       198.2 lb Date of Birth:  12-Dec-1955        BSA:          2.101 m Patient Age:    64 years         BP:           130/80 mmHg Patient Gender: M                HR:           88 bpm. Exam Location:  [facility]  Procedure: 2D Echo, Cardiac  Doppler, Color Doppler and Strain Analysis  Indications:    I25.110 Atherosclerotic heart disease of native coronary artery with unstable angina pectoris  History:        Patient has no prior history of Echocardiogram examinations. Previous Myocardial Infarction, Arrythmias:Atrial Fibrillation, Signs/Symptoms:Shortness of Breath; Risk Factors:Hypertension, Diabetes, Dyslipidemia and Non-Smoker.  Sonographer:    Roseline Finder RDCS, RVT Referring Phys: [id number] TIMOTHY J GOLLAN  IMPRESSIONS   1. Left ventricular ejection fraction, by estimation, is 55 to 60%. The left ventricle has normal function. The left ventricle demonstrates regional wall motion abnormalities (see scoring diagram/findings for description). There is moderate left ventricular hypertrophy. Left  ventricular diastolic parameters are consistent with Grade I diastolic dysfunction (impaired relaxation). There is mild hypokinesis of the left ventricular, basal-mid anterolateral wall. The average left ventricular global longitudinal strain is -14.9 %. The global longitudinal strain is abnormal. 2. Right ventricular systolic function is low normal. The right ventricular size is normal. There is normal pulmonary artery systolic pressure. 3. The mitral valve is normal in structure. Mild mitral valve regurgitation. 4. The aortic valve is tricuspid. Aortic valve regurgitation is not visualized. No aortic stenosis is present. 5. Aortic dilatation noted. There is borderline dilatation of the aortic root, measuring 38 mm. 6. The inferior vena cava is normal in size with <50% respiratory variability, suggesting right atrial pressure of 8 mmHg.  FINDINGS Left Ventricle: Left ventricular ejection fraction, by estimation, is 55 to 60%. The left ventricle has normal function. The left ventricle demonstrates regional wall motion abnormalities. Mild hypokinesis of the left ventricular, basal-mid anterolateral wall. The average left ventricular global longitudinal strain is -14.9 %. The global longitudinal strain is abnormal. The left ventricular internal cavity size was normal in size. There is moderate left ventricular hypertrophy. Left ventricular diastolic parameters are consistent with Grade I diastolic dysfunction (impaired relaxation).  Right Ventricle: The right ventricular size is normal. No increase in right ventricular wall thickness. Right ventricular systolic function is low normal. There is normal pulmonary artery systolic pressure. The tricuspid regurgitant velocity is 2.00 m/s, and with an assumed right atrial pressure of 8 mmHg, the estimated right ventricular systolic pressure is 24.0 mmHg.  Left Atrium: Left atrial size was normal in size.  Right Atrium: Right atrial size was normal in  size.  Pericardium: There is no evidence of pericardial effusion.  Mitral Valve: The mitral valve is normal in structure. Mild mitral valve regurgitation.  Tricuspid Valve: The tricuspid valve is grossly normal. Tricuspid valve regurgitation is mild.  Aortic Valve: The aortic valve is tricuspid. Aortic valve regurgitation is not visualized. No aortic stenosis is present. Aortic valve mean gradient measures 4.0 mmHg. Aortic valve peak gradient measures 7.6 mmHg. Aortic valve area, by VTI measures 3.78 cm.  Pulmonic Valve: The pulmonic valve was normal in structure. Pulmonic valve regurgitation is trivial. No evidence of pulmonic stenosis.  Aorta: Aortic dilatation noted. There is borderline dilatation of the aortic root, measuring 38 mm.  Pulmonary Artery: The pulmonary artery is of normal size.  Venous: The inferior vena cava is normal in size with less than 50% respiratory variability, suggesting right atrial pressure of 8 mmHg.  IAS/Shunts: The interatrial septum was not well visualized.   LEFT VENTRICLE PLAX 2D LVIDd:         3.30 cm      Diastology LVIDs:         2.40 cm      LV e' medial:    5.44 cm/s LV PW:  1.50 cm      LV E/e' medial:  14.2 LV IVS:        1.40 cm      LV e' lateral:   8.27 cm/s LVOT diam:     2.50 cm      LV E/e' lateral: 9.3 LV SV:         104 LV SV Index:   49           2D Longitudinal Strain LVOT Area:     4.91 cm     2D Strain GLS Avg:     -14.9 %  LV Volumes (MOD) LV vol d, MOD A2C: 98.9 ml LV vol d, MOD A4C: 109.0 ml LV vol s, MOD A2C: 24.1 ml LV vol s, MOD A4C: 46.6 ml LV SV MOD A2C:     74.8 ml LV SV MOD A4C:     109.0 ml LV SV MOD BP:      70.5 ml  RIGHT VENTRICLE             IVC RV Basal diam:  3.70 cm     IVC diam: 1.80 cm RV Mid diam:    3.40 cm RV S prime:     10.40 cm/s TAPSE (M-mode): 1.8 cm  LEFT ATRIUM             Index       RIGHT ATRIUM           Index LA Vol (A2C):   93.2 ml 44.37 ml/m RA Area:     16.00 cm LA  Vol (A4C):   43.5 ml 20.71 ml/m RA Volume:   39.50 ml  18.80 ml/m LA Biplane Vol: 65.1 ml 30.99 ml/m AORTIC VALVE                   PULMONIC VALVE AV Area (Vmax):    3.77 cm    PV Vmax:          0.83 m/s AV Area (Vmean):   3.73 cm    PV Peak grad:     2.8 mmHg AV Area (VTI):     3.78 cm    PR End Diast Vel: 2.44 msec AV Vmax:           138.00 cm/s AV Vmean:          95.100 cm/s AV VTI:            0.274 m AV Peak Grad:      7.6 mmHg AV Mean Grad:      4.0 mmHg LVOT Vmax:         106.00 cm/s LVOT Vmean:        72.200 cm/s LVOT VTI:          0.211 m LVOT/AV VTI ratio: 0.77  AORTA Ao Root diam: 3.80 cm Ao Asc diam:  3.30 cm Ao Arch diam: 2.9 cm  MITRAL VALVE                TRICUSPID VALVE MV Area (PHT): 3.76 cm     TR Peak grad:   16.0 mmHg MV Decel Time: 202 msec     TR Vmax:        200.00 cm/s MR Peak grad: 120.3 mmHg MR Mean grad: 85.7 mmHg     SHUNTS MR Vmax:      548.33 cm/s   Systemic VTI:  0.21 m MR Vmean:     438.3 cm/s    Systemic Diam: 2.50  cm MV E velocity: 77.10 cm/s MV A velocity: 102.00 cm/s MV E/A ratio:  0.76  Christopher End MD Electronically signed by Lonni Hanson MD Signature Date/Time: 10/03/2020/7:02:16 PM    Final          ______________________________________________________________________________________________      Risk Assessment/Calculations           Physical Exam VS:  BP 118/82 (BP Location: Left Arm, Patient Position: Sitting, Cuff Size: Large)   Pulse 96   Ht 5' 10 (1.778 m)   Wt 187 lb (84.8 kg)   SpO2 98%   BMI 26.83 kg/m        Wt Readings from Last 3 Encounters:  02/20/24 187 lb (84.8 kg)  02/12/23 182 lb 14.4 oz (83 kg)  02/04/23 184 lb 4 oz (83.6 kg)    GEN: Well nourished, well developed in no acute distress NECK: No JVD; No carotid bruits CARDIAC: RRR, no murmurs, rubs, gallops RESPIRATORY:  Clear to auscultation without rales, wheezing or rhonchi  ABDOMEN: Soft, non-tender,  non-distended EXTREMITIES:  No edema; No deformity   ASSESSMENT AND PLAN Coronary artery disease native coronary arteries with stable angina.  He continues to remain chest pain-free and without symptoms of decompensation.  EKG today reveals sinus rhythm with rate 96 no acute ischemic changes.  He has continued on aspirin 81 mg daily, atorvastatin  20 mg daily, ezetimibe  10 mg daily, and Toprol -XL.  No further ischemic evaluation is needed at this time.  Primary hypertension with a blood pressure of 118/82.  Blood pressures remain stable.  He is continued on Toprol -XL 100 mg daily.  He has been encouraged to continue to monitor his pressures 1 to 2 hours postmedication administration as well.  Mixed hyperlipidemia with last LDL 31 which remains at goal of less than 55.  He was continued on atorvastatin  and ezetimibe  with ongoing management per his PCP.  Type 2 diabetes with his last hemoglobin A1c of 7.4.  He is continued on Jardiance , metformin , pioglitazone , and now Mounjaro.  States that he is tolerating medications well without side effects.  Ongoing management per his PCP.       Dispo: Patient to return to clinic to see MD/APP in 11 to 12 months or sooner if needed for further evaluation.  Signed, Willean Schurman, NP   "
